# Patient Record
Sex: Female | Born: 1975
Health system: Southern US, Community
[De-identification: ages and names within clinical notes are randomized; demographics above are authoritative.]

## PROBLEM LIST (undated history)

## (undated) DIAGNOSIS — Z9889 Other specified postprocedural states: Secondary | ICD-10-CM

## (undated) DIAGNOSIS — I10 Essential (primary) hypertension: Secondary | ICD-10-CM

## (undated) DIAGNOSIS — N12 Tubulo-interstitial nephritis, not specified as acute or chronic: Secondary | ICD-10-CM

## (undated) DIAGNOSIS — F1910 Other psychoactive substance abuse, uncomplicated: Secondary | ICD-10-CM

## (undated) DIAGNOSIS — G8929 Other chronic pain: Secondary | ICD-10-CM

## (undated) DIAGNOSIS — T4145XA Adverse effect of unspecified anesthetic, initial encounter: Secondary | ICD-10-CM

## (undated) DIAGNOSIS — R112 Nausea with vomiting, unspecified: Secondary | ICD-10-CM

## (undated) DIAGNOSIS — T8859XA Other complications of anesthesia, initial encounter: Secondary | ICD-10-CM

## (undated) HISTORY — PX: FRACTURE SURGERY: SHX138

## (undated) HISTORY — PX: DILATION AND CURETTAGE OF UTERUS: SHX78

---

## 2009-07-17 ENCOUNTER — Emergency Department (HOSPITAL_COMMUNITY): Admission: EM | Admit: 2009-07-17 | Discharge: 2009-07-17 | Payer: Self-pay | Admitting: Emergency Medicine

## 2009-12-29 HISTORY — PX: PERCUTANEOUS PINNING TOE FRACTURE: SUR1018

## 2010-11-12 ENCOUNTER — Emergency Department (HOSPITAL_COMMUNITY): Admission: EM | Admit: 2010-11-12 | Discharge: 2010-11-12 | Payer: Self-pay | Admitting: Emergency Medicine

## 2011-04-29 ENCOUNTER — Inpatient Hospital Stay (HOSPITAL_COMMUNITY)
Admission: AD | Admit: 2011-04-29 | Discharge: 2011-04-29 | Disposition: A | Discharge status: Home / Self Care | Payer: Medicaid Other | Source: Ambulatory Visit | Attending: Family Medicine | Admitting: Family Medicine

## 2011-04-29 DIAGNOSIS — N751 Abscess of Bartholin's gland: Secondary | ICD-10-CM | POA: Insufficient documentation

## 2011-05-01 ENCOUNTER — Inpatient Hospital Stay (HOSPITAL_COMMUNITY)
Admission: AD | Admit: 2011-05-01 | Discharge: 2011-05-01 | Disposition: A | Discharge status: Home / Self Care | Payer: Medicaid Other | Source: Ambulatory Visit | Attending: Obstetrics & Gynecology | Admitting: Obstetrics & Gynecology

## 2011-05-01 DIAGNOSIS — N751 Abscess of Bartholin's gland: Secondary | ICD-10-CM | POA: Insufficient documentation

## 2011-05-29 ENCOUNTER — Encounter: Payer: Medicaid Other | Admitting: Obstetrics and Gynecology

## 2012-10-05 ENCOUNTER — Encounter (HOSPITAL_COMMUNITY): Payer: Self-pay | Admitting: General Practice

## 2012-10-05 ENCOUNTER — Emergency Department (INDEPENDENT_AMBULATORY_CARE_PROVIDER_SITE_OTHER)
Admission: EM | Admit: 2012-10-05 | Discharge: 2012-10-05 | Disposition: A | Discharge status: Nursing Home (Includes ICF) | Payer: Self-pay | Source: Home / Self Care | Attending: Family Medicine | Admitting: Family Medicine

## 2012-10-05 ENCOUNTER — Encounter (HOSPITAL_COMMUNITY): Payer: Self-pay | Admitting: *Deleted

## 2012-10-05 ENCOUNTER — Inpatient Hospital Stay (HOSPITAL_COMMUNITY)
Admission: EM | Admit: 2012-10-05 | Discharge: 2012-10-08 | DRG: 872 | Disposition: A | Discharge status: Home / Self Care | Payer: Medicaid Other | Attending: Internal Medicine | Admitting: Internal Medicine

## 2012-10-05 DIAGNOSIS — N289 Disorder of kidney and ureter, unspecified: Secondary | ICD-10-CM | POA: Diagnosis present

## 2012-10-05 DIAGNOSIS — R000 Tachycardia, unspecified: Secondary | ICD-10-CM | POA: Diagnosis present

## 2012-10-05 DIAGNOSIS — E876 Hypokalemia: Secondary | ICD-10-CM | POA: Diagnosis present

## 2012-10-05 DIAGNOSIS — N12 Tubulo-interstitial nephritis, not specified as acute or chronic: Secondary | ICD-10-CM | POA: Diagnosis present

## 2012-10-05 DIAGNOSIS — A419 Sepsis, unspecified organism: Secondary | ICD-10-CM | POA: Diagnosis present

## 2012-10-05 DIAGNOSIS — E878 Other disorders of electrolyte and fluid balance, not elsewhere classified: Secondary | ICD-10-CM | POA: Diagnosis present

## 2012-10-05 DIAGNOSIS — E871 Hypo-osmolality and hyponatremia: Secondary | ICD-10-CM | POA: Diagnosis present

## 2012-10-05 DIAGNOSIS — R6510 Systemic inflammatory response syndrome (SIRS) of non-infectious origin without acute organ dysfunction: Secondary | ICD-10-CM | POA: Diagnosis present

## 2012-10-05 DIAGNOSIS — N10 Acute tubulo-interstitial nephritis: Secondary | ICD-10-CM

## 2012-10-05 DIAGNOSIS — N179 Acute kidney failure, unspecified: Secondary | ICD-10-CM | POA: Diagnosis present

## 2012-10-05 DIAGNOSIS — D649 Anemia, unspecified: Secondary | ICD-10-CM | POA: Diagnosis present

## 2012-10-05 DIAGNOSIS — Z23 Encounter for immunization: Secondary | ICD-10-CM

## 2012-10-05 HISTORY — DX: Essential (primary) hypertension: I10

## 2012-10-05 LAB — CREATININE, SERUM
Creatinine, Ser: 1.3 mg/dL — ABNORMAL HIGH (ref 0.50–1.10)
GFR calc Af Amer: 60 mL/min — ABNORMAL LOW (ref >90)
GFR calc non Af Amer: 52 mL/min — ABNORMAL LOW (ref >90)

## 2012-10-05 LAB — BASIC METABOLIC PANEL
Calcium: 9.6 mg/dL (ref 8.4–10.5)
GFR calc non Af Amer: 38 mL/min — ABNORMAL LOW (ref >90)
Sodium: 131 mEq/L — ABNORMAL LOW (ref 135–145)

## 2012-10-05 LAB — CBC WITH DIFFERENTIAL/PLATELET
Basophils Absolute: 0 10*3/uL (ref 0.0–0.1)
Basophils Relative: 0 % (ref 0–1)
Eosinophils Absolute: 0 10*3/uL (ref 0.0–0.7)
Eosinophils Relative: 0 % (ref 0–5)
MCH: 32.5 pg (ref 26.0–34.0)
MCV: 92.6 fL (ref 78.0–100.0)
Platelets: 179 10*3/uL (ref 150–400)
RDW: 12.8 % (ref 11.5–15.5)
WBC: 13.6 10*3/uL — ABNORMAL HIGH (ref 4.0–10.5)

## 2012-10-05 LAB — POCT URINALYSIS DIP (DEVICE)
Glucose, UA: NEGATIVE mg/dL
Nitrite: NEGATIVE
Urobilinogen, UA: 2 mg/dL — ABNORMAL HIGH (ref 0.0–1.0)
pH: 5.5 (ref 5.0–8.0)

## 2012-10-05 LAB — CBC
Hemoglobin: 10 g/dL — ABNORMAL LOW (ref 12.0–15.0)
MCHC: 33.2 g/dL (ref 30.0–36.0)
WBC: 10.8 10*3/uL — ABNORMAL HIGH (ref 4.0–10.5)

## 2012-10-05 LAB — PROTIME-INR: Prothrombin Time: 15.3 seconds — ABNORMAL HIGH (ref 11.6–15.2)

## 2012-10-05 MED ORDER — POTASSIUM CHLORIDE CRYS ER 20 MEQ PO TBCR
40.0000 meq | EXTENDED_RELEASE_TABLET | Freq: Three times a day (TID) | ORAL | Status: DC
  Administered 2012-10-05 (×2): 40 meq via ORAL
  Filled 2012-10-05 (×3): qty 2

## 2012-10-05 MED ORDER — HEPARIN SODIUM (PORCINE) 5000 UNIT/ML IJ SOLN
5000.0000 [IU] | Freq: Three times a day (TID) | INTRAMUSCULAR | Status: DC
  Administered 2012-10-05 – 2012-10-08 (×8): 5000 [IU] via SUBCUTANEOUS
  Filled 2012-10-05 (×12): qty 1

## 2012-10-05 MED ORDER — ACETAMINOPHEN 325 MG PO TABS
650.0000 mg | ORAL_TABLET | Freq: Four times a day (QID) | ORAL | Status: DC | PRN
  Administered 2012-10-05 – 2012-10-06 (×2): 650 mg via ORAL
  Filled 2012-10-05 (×2): qty 2

## 2012-10-05 MED ORDER — ACETAMINOPHEN 325 MG PO TABS
650.0000 mg | ORAL_TABLET | Freq: Once | ORAL | Status: AC
  Administered 2012-10-05: 650 mg via ORAL

## 2012-10-05 MED ORDER — SODIUM CHLORIDE 0.9 % IV BOLUS (SEPSIS)
1000.0000 mL | Freq: Once | INTRAVENOUS | Status: AC
  Administered 2012-10-05: 1000 mL via INTRAVENOUS

## 2012-10-05 MED ORDER — ACETAMINOPHEN 650 MG RE SUPP: 650.0000 mg | Freq: Four times a day (QID) | RECTAL | Status: DC | PRN

## 2012-10-05 MED ORDER — MORPHINE SULFATE 4 MG/ML IJ SOLN
4.0000 mg | Freq: Once | INTRAMUSCULAR | Status: AC
  Administered 2012-10-05: 4 mg via INTRAVENOUS
  Filled 2012-10-05: qty 1

## 2012-10-05 MED ORDER — WHITE PETROLATUM GEL
Status: AC
  Filled 2012-10-05: qty 5

## 2012-10-05 MED ORDER — IBUPROFEN 600 MG PO TABS
600.0000 mg | ORAL_TABLET | Freq: Four times a day (QID) | ORAL | Status: DC | PRN
  Administered 2012-10-05 (×2): 600 mg via ORAL
  Filled 2012-10-05: qty 3
  Filled 2012-10-05: qty 1

## 2012-10-05 MED ORDER — DEXTROSE 5 % IV SOLN
1.0000 g | Freq: Once | INTRAVENOUS | Status: AC
Start: 2012-10-05 — End: 2012-10-05
  Administered 2012-10-05: 1 g via INTRAVENOUS
  Filled 2012-10-05 (×2): qty 10

## 2012-10-05 MED ORDER — PNEUMOCOCCAL VAC POLYVALENT 25 MCG/0.5ML IJ INJ
0.5000 mL | INJECTION | INTRAMUSCULAR | Status: AC
  Administered 2012-10-06: 0.5 mL via INTRAMUSCULAR
  Filled 2012-10-05: qty 0.5

## 2012-10-05 MED ORDER — ONDANSETRON HCL 4 MG/2ML IJ SOLN
4.0000 mg | Freq: Four times a day (QID) | INTRAMUSCULAR | Status: DC | PRN
  Administered 2012-10-05 – 2012-10-06 (×3): 4 mg via INTRAVENOUS
  Filled 2012-10-05 (×4): qty 2

## 2012-10-05 MED ORDER — ONDANSETRON HCL 4 MG/2ML IJ SOLN
4.0000 mg | Freq: Once | INTRAMUSCULAR | Status: AC
  Administered 2012-10-05: 4 mg via INTRAVENOUS
  Filled 2012-10-05: qty 2

## 2012-10-05 MED ORDER — PANTOPRAZOLE SODIUM 40 MG PO TBEC
40.0000 mg | DELAYED_RELEASE_TABLET | Freq: Every day | ORAL | Status: DC
  Administered 2012-10-06 – 2012-10-08 (×3): 40 mg via ORAL
  Filled 2012-10-05 (×3): qty 1

## 2012-10-05 MED ORDER — SODIUM CHLORIDE 0.9 % IV SOLN
INTRAVENOUS | Status: AC
  Administered 2012-10-05: 19:00:00 via INTRAVENOUS

## 2012-10-05 MED ORDER — CIPROFLOXACIN IN D5W 400 MG/200ML IV SOLN
400.0000 mg | Freq: Once | INTRAVENOUS | Status: AC
  Administered 2012-10-05: 400 mg via INTRAVENOUS
  Filled 2012-10-05: qty 200

## 2012-10-05 MED ORDER — ONDANSETRON HCL 4 MG PO TABS: 4.0000 mg | ORAL_TABLET | Freq: Four times a day (QID) | ORAL | Status: DC | PRN

## 2012-10-05 NOTE — H&P (Signed)
-  Agree with IV Rocephin, urine cultures and blood pending. Continue IV fluids and 125. We will give her a liter, Strict I.'s and O.'s. Continue Tylenol for fevers ibuprofen for pain. -Check basic metabolic panel in the morning. To followup on her creatinine. The most likely cause of her acute kidney injury is probably decreased intravascular volume, she has hyponatremia hypochloremia and hypokalemia,replete her potassium and check magnesium level.

## 2012-10-05 NOTE — ED Notes (Signed)
Pt reports back pain and frequent urination with vomiting since friday

## 2012-10-05 NOTE — H&P (Signed)
[facility] History and Physical  Marcella Bravata ZOX:[id number] DOB: [birth year] DOA: 10/05/2012  Referring physician: Patria Mane PCP: Dorrene German, MD  Specialists:   Chief Complaint:   HPI: Kaylee Simpson is a 36 y.o. female with no medical history presents to ED cc low back pain. Info obtained from patient. Reports that 4 days ago developed mild low back pain. Took OTC med and got some relief. Then two days ago pain worsened and spread to flank areas and upper abdomen. Associated symptoms include, chills, nausea, vomiting, diaphoresis, decreased po intake. Denies CP, sob, headache. Describes pain as ache, rates 7/10. Nothing makes better or worse. Symptoms came on suddenly and persisted and worsened. Work up yields dehydration, UTI, SIRS. We are asked to admit for further evaluation.    Review of Systems: The patient denies  weight loss,, vision loss, decreased hearing, hoarseness, chest pain, syncope, dyspnea on exertion, peripheral edema, balance deficits, hemoptysis,  melena, hematochezia, severe indigestion/heartburn, hematuria, incontinence, genital sores, muscle weakness, suspicious skin lesions, transient blindness, difficulty walking, depression, unusual weight change, abnormal bleeding, enlarged lymph nodes, angioedema, and breast masses.    No past medical history on file. No past surgical history on file. Social History:  reports that she smokes about 5 cigs daily. She does not have any smokeless tobacco history on file. She reports that she does not drink alcohol. Her drug history not on file.   No Known Allergies  Family History  Problem Relation Age of Onset  . HTN  Mother and father with HTN. Cousins with lupus  Prior to Admission medications   Medication Sig Start Date End Date Taking? Authorizing Provider  ibuprofen (ADVIL,MOTRIN) 200 MG tablet Take 600 mg by mouth every 6 (six) hours as needed. For pain   Yes Historical Provider, MD   Physical Exam: Filed Vitals:    10/05/12 1326  BP: 120/68  Pulse: 109  Temp: 100.6 F (38.1 C)  TempSrc: Oral  Resp: 18  SpO2: 99%     General:  Awake, alert well nourished  Eyes: PERRL EOMI no scleral icterus  ENT: mucus membranes pink but dry, ears clear, nose without drainage  Neck: supple no JVD   Cardiovascular: tachycardic but regular. No MGR No LEE PPP  Respiratory: Normal effort. BSCTAB no wheeze  Abdomen: soft, +BS mild tenderness upper quadrants  Skin: warm, slightly moist, no rash or lesions  Musculoskeletal: MOE No joint swelling erythema. Joints non-tender to palpation  Psychiatric: appropriate, cooperative  Neurologic: cranial nerve II-XII intact. Speech clear.   Labs on Admission:  Basic Metabolic Panel:  Lab 10/05/12 8119  NA 131*  K 2.9*  CL 93*  CO2 23  GLUCOSE 115*  BUN 25*  CREATININE 1.66*  CALCIUM 9.6  MG --  PHOS --   Liver Function Tests: No results found for this basename: AST:5,ALT:5,ALKPHOS:5,BILITOT:5,PROT:5,ALBUMIN:5 in the last 168 hours No results found for this basename: LIPASE:5,AMYLASE:5 in the last 168 hours No results found for this basename: AMMONIA:5 in the last 168 hours CBC:  Lab 10/05/12 1332  WBC 13.6*  NEUTROABS 9.5*  HGB 12.3  HCT 35.0*  MCV 92.6  PLT 179   Cardiac Enzymes: No results found for this basename: CKTOTAL:5,CKMB:5,CKMBINDEX:5,TROPONINI:5 in the last 168 hours  BNP (last 3 results) No results found for this basename: PROBNP:3 in the last 8760 hours CBG: No results found for this basename: GLUCAP:5 in the last 168 hours  Radiological Exams on Admission: No results found.  EKG: Independently reviewed.   Assessment/Plan Principal Problem:  *  Pyelonephritis: will continue cipro and rocephin IV. Will hydrate with IV fluids. Will check urine culture and adjust antibiotics as needed based on culture. SIRS (systemic inflammatory response syndrome): related to #1. WC elevated, tachycardic, fever. Will check urine culture.  Continue IV fluids and Iv antibiotics. Currently pt is hemodynamically stable.  Monitor closely  Acute renal insufficiency: likely related to decreased po intake as a result of #1 and #2. Will hydrate with IV fluids and check in am. Will hold any nephrotoxins.  Dehydration: due to #1. See treatment as above. Will recheck bmet in am.  Active Problems:  Hypokalemia: related to #4. Will replete and recheck  Hyponatremia: related to #4. IV fluids as above. Will recheck in am  Tachycardia: due to #1 and #2. See treatments above. Will admit to tele and monitor closely.     Code Status: full Family Communication: patient at bedside Disposition Plan: Home when medically stable hopefully 24-36 hours  Time spent: 45 minutes  Gwenyth Bender  NP [facility]   If 7PM-7AM, please contact night-coverage [email or web address] Password [email or web address] 10/05/2012, 3:22 PM

## 2012-10-05 NOTE — ED Provider Notes (Signed)
History     CSN: [id number]  Arrival date & time 10/05/12  1318   First MD Initiated Contact with Patient 10/05/12 1320      Chief Complaint  Patient presents with  . Abdominal Pain    (Consider location/radiation/quality/duration/timing/severity/associated sxs/prior treatment) HPI  From the [facility] to the ER for evaluation of Pyelonephritis. Pt is febrile, tachycardic and in pain. Her urine showed large amount of leukocytes at the Urgent Care. Pt says that she has been nauseous but has not vomiting. She denies feeling weak but has felt warm "to the touch". She denies having urinary symptoms up until two days ago. She denies ever having this happen in the past or having chronic medical issues.  No past medical history on file.  No past surgical history on file.  Family History  Problem Relation Age of Onset  . Family history unknown: Yes    History  Substance Use Topics  . Smoking status: Never Smoker   . Smokeless tobacco: Not on file  . Alcohol Use: No    OB History    Grav Para Term Preterm Abortions TAB SAB Ect Mult Living                  Review of Systems   Review of Systems  Gen: no weight loss, fevers, chills, night sweats  Eyes: no discharge or drainage, no occular pain or visual changes  Nose: no epistaxis or rhinorrhea  Mouth: no dental pain, no sore throat  Neck: no neck pain  Lungs:No wheezing, coughing or hemoptysis CV: no chest pain, palpitations, dependent edema or orthopnea  Abd: no abdominal pain, nausea, vomiting  GU: + dysuria, + CVA tenderness, + low back pain and right flank pain,  or no gross hematuria  MSK:  No abnormalities  Neuro: no headache, no focal neurologic deficits  Skin: no abnormalities Psyche: negative.    Allergies  Review of patient's allergies indicates no known allergies.  Home Medications   Current Outpatient Rx  Name Route Sig Dispense Refill  . IBUPROFEN 200 MG PO TABS Oral Take 600 mg by mouth every 6 (six)  hours as needed. For pain      BP 120/68  Pulse 109  Temp 100.6 F (38.1 C) (Oral)  Resp 18  SpO2 99%  LMP 09/26/2012  Physical Exam  Physical Exam  Nursing note and vitals reviewed.  Constitutional: She is oriented to person, place, and time. She appears well-developed and well-nourished. She appears distressed.  Neck: Normal range of motion. Neck supple.  Cardiovascular: Normal rate, regular rhythm, normal heart sounds and intact distal pulses.  Pulmonary/Chest: Effort normal and breath sounds normal.  Abdominal: Soft. She exhibits no distension and no mass. Bowel sounds are decreased. There is no hepatosplenomegaly. There is tenderness in the right upper quadrant, epigastric area and left upper quadrant. There is CVA tenderness. There is no rebound and no guarding.  Lymphadenopathy:  She has no cervical adenopathy.  Neurological: She is alert and oriented to person, place, and time.  Skin: Skin is warm and dry.    ED Course  Procedures (including critical care time)  Labs Reviewed  CBC WITH DIFFERENTIAL - Abnormal; Notable for the following:    WBC 13.6 (*)     RBC 3.78 (*)     HCT 35.0 (*)     Neutro Abs 9.5 (*)     Lymphocytes Relative 8 (*)     Monocytes Relative 23 (*)     Monocytes  Absolute 3.1 (*)     All other components within normal limits  BASIC METABOLIC PANEL - Abnormal; Notable for the following:    Sodium 131 (*)     Potassium 2.9 (*)     Chloride 93 (*)     Glucose, Bld 115 (*)     BUN 25 (*)     Creatinine, Ser 1.66 (*)     GFR calc non Af Amer 38 (*)     GFR calc Af Amer 45 (*)     All other components within normal limits  URINE CULTURE   No results found. Results for orders placed during the hospital encounter of 10/05/12  CBC WITH DIFFERENTIAL      Component Value Range   WBC 13.6 (*) 4.0 - 10.5 K/uL   RBC 3.78 (*) 3.87 - 5.11 MIL/uL   Hemoglobin 12.3  12.0 - 15.0 g/dL   HCT 16.1 (*) 09.6 - 04.5 %   MCV 92.6  78.0 - 100.0 fL   MCH  32.5  26.0 - 34.0 pg   MCHC 35.1  30.0 - 36.0 g/dL   RDW 40.9  81.1 - 91.4 %   Platelets 179  150 - 400 K/uL   Neutrophils Relative 69  43 - 77 %   Neutro Abs 9.5 (*) 1.7 - 7.7 K/uL   Lymphocytes Relative 8 (*) 12 - 46 %   Lymphs Abs 1.0  0.7 - 4.0 K/uL   Monocytes Relative 23 (*) 3 - 12 %   Monocytes Absolute 3.1 (*) 0.1 - 1.0 K/uL   Eosinophils Relative 0  0 - 5 %   Eosinophils Absolute 0.0  0.0 - 0.7 K/uL   Basophils Relative 0  0 - 1 %   Basophils Absolute 0.0  0.0 - 0.1 K/uL  BASIC METABOLIC PANEL      Component Value Range   Sodium 131 (*) 135 - 145 mEq/L   Potassium 2.9 (*) 3.5 - 5.1 mEq/L   Chloride 93 (*) 96 - 112 mEq/L   CO2 23  19 - 32 mEq/L   Glucose, Bld 115 (*) 70 - 99 mg/dL   BUN 25 (*) 6 - 23 mg/dL   Creatinine, Ser 7.82 (*) 0.50 - 1.10 mg/dL   Calcium 9.6  8.4 - 95.6 mg/dL   GFR calc non Af Amer 38 (*) >90 mL/min   GFR calc Af Amer 45 (*) >90 mL/min   No results found.    1. Pyelonephritis       MDM  Pt has pyelonephritis with fever, tachycardia and new onset renal insufficiency. I believe she meets admission criteria.  I have had her admitted to [facility], inpatient, med-surg        Dorthula Matas, [place] 10/05/12 1456

## 2012-10-05 NOTE — ED Provider Notes (Signed)
History     CSN: [id number]  Arrival date & time 10/05/12  1215   First MD Initiated Contact with Patient 10/05/12 1242      Chief Complaint  Patient presents with  . Back Pain    (Consider location/radiation/quality/duration/timing/severity/associated sxs/prior treatment) Patient is a 36 y.o. female presenting with back pain. The history is provided by the patient.  Back Pain  This is a new problem. The current episode started more than 2 days ago. The problem has been gradually worsening. Associated symptoms include a fever, abdominal pain and dysuria.    History reviewed. No pertinent past medical history.  History reviewed. No pertinent past surgical history.  Family History  Problem Relation Age of Onset  . Family history unknown: Yes    History  Substance Use Topics  . Smoking status: Never Smoker   . Smokeless tobacco: Not on file  . Alcohol Use: No    OB History    Grav Para Term Preterm Abortions TAB SAB Ect Mult Living                  Review of Systems  Constitutional: Positive for fever, chills and appetite change.  Gastrointestinal: Positive for nausea, vomiting and abdominal pain.  Genitourinary: Positive for dysuria, urgency, frequency and flank pain. Negative for vaginal bleeding, vaginal discharge and vaginal pain.  Musculoskeletal: Positive for back pain.    Allergies  Review of patient's allergies indicates no known allergies.  Home Medications  No current outpatient prescriptions on file.  BP 108/61  Pulse 118  Temp 100.7 F (38.2 C) (Oral)  Resp 20  SpO2 100%  LMP 09/26/2012  Physical Exam  Nursing note and vitals reviewed. Constitutional: She is oriented to person, place, and time. She appears well-developed and well-nourished. She appears distressed.  Neck: Normal range of motion. Neck supple.  Cardiovascular: Normal rate, regular rhythm, normal heart sounds and intact distal pulses.   Pulmonary/Chest: Effort normal and breath  sounds normal.  Abdominal: Soft. She exhibits no distension and no mass. Bowel sounds are decreased. There is no hepatosplenomegaly. There is tenderness in the right upper quadrant, epigastric area and left upper quadrant. There is CVA tenderness. There is no rebound and no guarding.  Lymphadenopathy:    She has no cervical adenopathy.  Neurological: She is alert and oriented to person, place, and time.  Skin: Skin is warm and dry.    ED Course  Procedures (including critical care time)  Labs Reviewed  POCT URINALYSIS DIP (DEVICE) - Abnormal; Notable for the following:    Bilirubin Urine SMALL (*)     Hgb urine dipstick MODERATE (*)     Protein, ur >=300 (*)     Urobilinogen, UA 2.0 (*)     Leukocytes, UA LARGE (*)  Biochemical Testing Only. Please order routine urinalysis from main lab if confirmatory testing is needed.   All other components within normal limits  POCT PREGNANCY, URINE   No results found.   1. Pyelonephritis, acute       MDM  U/a abnl.        Linna Hoff, MD 10/05/12 1310

## 2012-10-05 NOTE — ED Provider Notes (Signed)
Medical screening examination/treatment/procedure(s) were conducted as a shared visit with non-physician practitioner(s) and myself.  I personally evaluated the patient during the encounter  The patient is feeling somewhat better at this time.  She will benefit from observational stay in the hospital for her pyelonephritis.  IV antibiotics started.  Urine culture pending.  Lyanne Co, MD 10/05/12 [phone]

## 2012-10-05 NOTE — ED Notes (Signed)
Pt from [facility] co rt sided abdominal pain and back pain since sat.

## 2012-10-06 ENCOUNTER — Inpatient Hospital Stay (HOSPITAL_COMMUNITY): Payer: Medicaid Other

## 2012-10-06 DIAGNOSIS — E876 Hypokalemia: Secondary | ICD-10-CM

## 2012-10-06 LAB — COMPREHENSIVE METABOLIC PANEL
ALT: 49 U/L — ABNORMAL HIGH (ref 0–35)
AST: 48 U/L — ABNORMAL HIGH (ref 0–37)
CO2: 22 mEq/L (ref 19–32)
Chloride: 103 mEq/L (ref 96–112)
Creatinine, Ser: 1.38 mg/dL — ABNORMAL HIGH (ref 0.50–1.10)
GFR calc non Af Amer: 48 mL/min — ABNORMAL LOW (ref >90)
Sodium: 137 mEq/L (ref 135–145)
Total Bilirubin: 0.3 mg/dL (ref 0.3–1.2)

## 2012-10-06 LAB — CBC
MCH: 31.6 pg (ref 26.0–34.0)
MCHC: 33.5 g/dL (ref 30.0–36.0)
Platelets: 191 10*3/uL (ref 150–400)

## 2012-10-06 LAB — URINE CULTURE

## 2012-10-06 LAB — BASIC METABOLIC PANEL
Calcium: 8.8 mg/dL (ref 8.4–10.5)
GFR calc non Af Amer: 56 mL/min — ABNORMAL LOW (ref >90)
Sodium: 135 mEq/L (ref 135–145)

## 2012-10-06 LAB — PROTIME-INR: INR: 1.16 (ref 0.00–1.49)

## 2012-10-06 MED ORDER — POTASSIUM CHLORIDE CRYS ER 20 MEQ PO TBCR
40.0000 meq | EXTENDED_RELEASE_TABLET | Freq: Once | ORAL | Status: AC
  Administered 2012-10-06: 40 meq via ORAL
  Filled 2012-10-06: qty 2

## 2012-10-06 MED ORDER — CEFTRIAXONE SODIUM 1 G IJ SOLR
1.0000 g | INTRAMUSCULAR | Status: DC
  Administered 2012-10-06 – 2012-10-07 (×2): 1 g via INTRAVENOUS
  Filled 2012-10-06 (×3): qty 10

## 2012-10-06 MED ORDER — OXYCODONE-ACETAMINOPHEN 5-325 MG PO TABS
1.0000 | ORAL_TABLET | ORAL | Status: DC | PRN
  Administered 2012-10-06 – 2012-10-08 (×6): 1 via ORAL
  Filled 2012-10-06 (×6): qty 1

## 2012-10-06 MED ORDER — DIPHENHYDRAMINE HCL 25 MG PO CAPS
50.0000 mg | ORAL_CAPSULE | ORAL | Status: DC | PRN
  Administered 2012-10-06 – 2012-10-08 (×4): 50 mg via ORAL
  Filled 2012-10-06 (×4): qty 2

## 2012-10-06 MED ORDER — SODIUM CHLORIDE 0.9 % IV SOLN
INTRAVENOUS | Status: AC
  Administered 2012-10-06: 15:00:00 via INTRAVENOUS

## 2012-10-06 NOTE — Progress Notes (Signed)
Patient ID: Kaylee Simpson, female   DOB: September 04, 1976, 36 y.o.   MRN: [id number] [facility] PROGRESS NOTE  Kaylee Simpson WUJ:[id number] DOB: 06-Feb-1976 DOA: 10/05/2012 PCP: Dorrene German, MD  Assessment/Plan: Principal Problem:  *Pyelonephritis Active Problems:  Hypokalemia  Hyponatremia  Tachycardia  SIRS (systemic inflammatory response syndrome)  Acute renal insufficiency  HPI: Kaylee Simpson is a 36 y.o. female with no medical history presents to ED cc low back pain. Info obtained from patient. Reports that 4 days ago developed mild low back pain. Took OTC med and got some relief. Then two days ago pain worsened and spread to flank areas and upper abdomen. Associated symptoms include, chills, nausea, vomiting, diaphoresis, decreased po intake. Denies CP, sob, headache. Describes pain as ache, rates 7/10. Nothing makes better or worse. Symptoms came on suddenly and persisted and worsened. Work up yields dehydration, UTI, SIRS  Pyelonephritis.  Fever 100.7 overnight.  Back pain still present but improved.  On IV Rocephin.  Cultures pending.  Renal U/S pending.  Acute renal failure.  Secondary to pyelonephritis and dehydration.  Hypokalemia.  Initially K was 2.9.  Being repleted.  Magnesium is 2.0  Normocytic anemia.  Stable for outpatient work up.   Code Status: full code Family Communication:  Disposition Plan:  To home when ready.    Antibiotics:  Rocephin  HPI/Subjective: Feels better today.  Back pain present but improved.  Has burning at the end of her urine stream.  Objective: Filed Vitals:   10/06/12 0427 10/06/12 0429 10/06/12 0432 10/06/12 0924  BP: 90/53 89/53 91/52    Pulse: 80 81 82 72  Temp: 97.5 F (36.4 C) 97.4 F (36.3 C) 97.5 F (36.4 C) 98.4 F (36.9 C)  TempSrc: Oral Oral Oral Oral  Resp: 20 20 20    Height:      Weight:      SpO2: 100% 100% 100% 98%    Intake/Output Summary (Last 24 hours) at 10/06/12 1143 Last data filed at 10/06/12  0408  Gross per 24 hour  Intake 1204.5 ml  Output    150 ml  Net 1054.5 ml    Exam:   General:  A&O, NAD, non-toxic  Cardiovascular: RRR, No M/R/G, no Lower Extremity Edema  Respiratory: CTA, No increased work of breathing  Abdomen: Soft, NT, ND, positive bowel sounds, no obvious masses.  + CVA tenderness  Neurological:  Cranial Nerves 2 - 12 are grossly intact, exam non focal  Skin:  No rashes, bruises, or lesions  Data Reviewed: Basic Metabolic Panel:  Lab 10/06/12 9562 10/06/12 0540 10/05/12 1735 10/05/12 1332  NA 135 137 -- 131*  K 3.4* 3.1* -- 2.9*  CL 103 103 -- 93*  CO2 20 22 -- 23  GLUCOSE 108* 114* -- 115*  BUN 22 24* -- 25*  CREATININE 1.22* 1.38* 1.30* 1.66*  CALCIUM 8.8 8.6 -- 9.6  MG -- -- 2.0 --  PHOS -- -- -- --   Liver Function Tests:  Lab 10/06/12 0540  AST 48*  ALT 49*  ALKPHOS 75  BILITOT 0.3  PROT 7.0  ALBUMIN 2.5*   CBC:  Lab 10/06/12 0850 10/05/12 1735 10/05/12 1332  WBC 11.3* 10.8* 13.6*  NEUTROABS -- -- 9.5*  HGB 10.9* 10.0* 12.3  HCT 32.5* 30.1* 35.0*  MCV 94.2 93.5 92.6  PLT 191 158 179     Recent Results (from the past 240 hour(s))  CULTURE, BLOOD (ROUTINE X 2)     Status: Normal (Preliminary result)   Collection Time  10/05/12  3:30 PM      Component Value Range Status Comment   Specimen Description BLOOD ARM RIGHT   Final    Special Requests BOTTLES DRAWN AEROBIC AND ANAEROBIC 10CC   Final    Culture  Setup Time 10/05/2012 21:12   Final    Culture     Final    Value:        BLOOD CULTURE RECEIVED NO GROWTH TO DATE CULTURE WILL BE HELD FOR 5 DAYS BEFORE ISSUING A FINAL NEGATIVE REPORT   Report Status PENDING   Incomplete   CULTURE, BLOOD (ROUTINE X 2)     Status: Normal (Preliminary result)   Collection Time   10/05/12  3:50 PM      Component Value Range Status Comment   Specimen Description BLOOD HAND RIGHT   Final    Special Requests BOTTLES DRAWN AEROBIC AND ANAEROBIC 10CC   Final    Culture  Setup Time  10/05/2012 21:12   Final    Culture     Final    Value:        BLOOD CULTURE RECEIVED NO GROWTH TO DATE CULTURE WILL BE HELD FOR 5 DAYS BEFORE ISSUING A FINAL NEGATIVE REPORT   Report Status PENDING   Incomplete      Studies: No results found.  Scheduled Meds:   . sodium chloride   Intravenous STAT  . acetaminophen  650 mg Oral Once  . cefTRIAXone (ROCEPHIN)  IV  1 g Intravenous Once  . cefTRIAXone (ROCEPHIN)  IV  1 g Intravenous Q24H  . ciprofloxacin  400 mg Intravenous Once  . heparin  5,000 Units Subcutaneous Q8H  .  morphine injection  4 mg Intravenous Once  . ondansetron  4 mg Intravenous Once  . pantoprazole  40 mg Oral Q1200  . pneumococcal 23 valent vaccine  0.5 mL Intramuscular Tomorrow-1000  . potassium chloride  40 mEq Oral TID  . potassium chloride  40 mEq Oral Once  . sodium chloride  1,000 mL Intravenous Once  . sodium chloride  1,000 mL Intravenous Once  . white petrolatum       Continuous Infusions:    Stephani Police [facility] Pager [phone]  If 7PM-7AM, please contact night-coverage [email or web address] Password [email or web address] 10/06/2012, 11:43 AM   LOS: 1 day   Attending - Seen and examined, agree with documentation as noted above. Kaylee Simpson feels better, still has back pain (is in her b/l flanks) but is much less intense. Urine cultures are still pending, blood cultures are negative. On exam she has B/L CVA tenderness, and as well upper abd (B/L) tenderness. Likely she has b/l pyelonephritis-will continue with Rocephin, await Renal Ultrasound. If still febrile for the next day or so-then will get a CT Abdomen. In interim-c/w current care.  Windell Norfolk MD

## 2012-10-07 ENCOUNTER — Inpatient Hospital Stay (HOSPITAL_COMMUNITY): Payer: Medicaid Other

## 2012-10-07 LAB — CBC
Platelets: 250 10*3/uL (ref 150–400)
RBC: 3.19 MIL/uL — ABNORMAL LOW (ref 3.87–5.11)
WBC: 8 10*3/uL (ref 4.0–10.5)

## 2012-10-07 LAB — BASIC METABOLIC PANEL
CO2: 21 mEq/L (ref 19–32)
Calcium: 9 mg/dL (ref 8.4–10.5)
Chloride: 105 mEq/L (ref 96–112)
Sodium: 139 mEq/L (ref 135–145)

## 2012-10-07 LAB — VITAMIN B12: Vitamin B-12: 1153 pg/mL — ABNORMAL HIGH (ref 211–911)

## 2012-10-07 LAB — IRON AND TIBC
Iron: 24 ug/dL — ABNORMAL LOW (ref 42–135)
Saturation Ratios: 14 % — ABNORMAL LOW (ref 20–55)
TIBC: 177 ug/dL — ABNORMAL LOW (ref 250–470)

## 2012-10-07 LAB — FOLATE: Folate: 12 ng/mL

## 2012-10-07 LAB — SEDIMENTATION RATE: Sed Rate: >140 mm/hr — ABNORMAL HIGH (ref 0–22)

## 2012-10-07 MED ORDER — POTASSIUM CHLORIDE CRYS ER 20 MEQ PO TBCR
40.0000 meq | EXTENDED_RELEASE_TABLET | Freq: Two times a day (BID) | ORAL | Status: AC
  Administered 2012-10-07 (×2): 40 meq via ORAL
  Filled 2012-10-07 (×2): qty 2

## 2012-10-07 NOTE — Progress Notes (Signed)
Patient ID: Kaylee Simpson, female   DOB: [birth year], 36 y.o.   MRN: [id number]  [facility] PROGRESS NOTE  Jaslynne Dahan WUJ:[id number] DOB: Sep 05, 1976 DOA: 10/05/2012 PCP: Dorrene German, MD  Assessment/Plan: Principal Problem:  *Pyelonephritis Active Problems:  Hypokalemia  Hyponatremia  Tachycardia  SIRS (systemic inflammatory response syndrome)  Acute renal insufficiency   HPI: Mialee Weyman is a 36 y.o. female with no medical history presents to ED cc low back pain. Info obtained from patient. Reports that 4 days ago developed mild low back pain. Took OTC med and got some relief. Then two days ago pain worsened and spread to flank areas and upper abdomen. Associated symptoms include, chills, nausea, vomiting, diaphoresis, decreased po intake. Denies CP, sob, headache. Describes pain as ache, rates 7/10. Nothing makes better or worse. Symptoms came on suddenly and persisted and worsened. Work up yields dehydration, UTI, SIRS  Pyelonephritis.  No fever overnight, but patient is having shaking chills and back pain after her percocet wears off. On IV Rocephin.  Cultures negative, but we still feel this likely infectious (patient had antibiotics prior to admission).  Renal U/S shows:  "Increased echogenicity in the kidneys bilaterally, suggesting medical renal disease".  Patient has no history of excessive NSAIDs and is not on nephrotoxic meds.  She reports she has a strong fam hx of lupus.  Will check lupus panel and complete abdominal xray.  Acute renal failure.  Secondary to pyelonephritis and dehydration.  BUN and creatinine have normalized.  Hypokalemia.  Initially K was 2.9.  Being repleted.  Magnesium is 2.0  Normocytic anemia.  Patient with mild hematuria. Reports periods are usually 3 days and not heavy.  Will check anemia panel.   Code Status: full code Family Communication:  Disposition Plan:  To home when ready.    Antibiotics:  Rocephin  HPI/Subjective: Feels  better today.  Back pain present but improved.  Has burning at the end of her urine stream.  Objective: Filed Vitals:   10/06/12 1400 10/06/12 1417 10/06/12 2223 10/07/12 0457  BP: 108/59 110/58 130/82 110/67  Pulse: 100 98 94 70  Temp: 99.8 F (37.7 C) 99.9 F (37.7 C) 99.1 F (37.3 C) 98.8 F (37.1 C)  TempSrc: Oral Oral Oral Oral  Resp: 18 12 18 16   Height:      Weight:      SpO2: 100% 99% 100% 100%    Intake/Output Summary (Last 24 hours) at 10/07/12 1120 Last data filed at 10/07/12 0030  Gross per 24 hour  Intake    955 ml  Output      0 ml  Net    955 ml    Exam:   General:  A&O, NAD, non-toxic  Cardiovascular: RRR, No M/R/G, no Lower Extremity Edema  Respiratory: CTA, No increased work of breathing  Abdomen: Soft, NT, ND, positive bowel sounds, no obvious masses.  + CVA tenderness  Neurological:  Cranial Nerves 2 - 12 are grossly intact, exam non focal  Skin:  No rashes, bruises, or lesions  Data Reviewed: Basic Metabolic Panel:  Lab 10/07/12 9562 10/06/12 0850 10/06/12 0540 10/05/12 1735 10/05/12 1332  NA 139 135 137 -- 131*  K 3.4* 3.4* 3.1* -- 2.9*  CL 105 103 103 -- 93*  CO2 21 20 22  -- 23  GLUCOSE 138* 108* 114* -- 115*  BUN 9 22 24* -- 25*  CREATININE 0.87 1.22* 1.38* 1.30* 1.66*  CALCIUM 9.0 8.8 8.6 -- 9.6  MG -- -- -- 2.0 --  PHOS -- -- -- -- --   Liver Function Tests:  Lab 10/06/12 0540  AST 48*  ALT 49*  ALKPHOS 75  BILITOT 0.3  PROT 7.0  ALBUMIN 2.5*   CBC:  Lab 10/07/12 0901 10/06/12 0850 10/05/12 1735 10/05/12 1332  WBC 8.0 11.3* 10.8* 13.6*  NEUTROABS -- -- -- 9.5*  HGB 9.9* 10.9* 10.0* 12.3  HCT 30.5* 32.5* 30.1* 35.0*  MCV 95.6 94.2 93.5 92.6  PLT 250 191 158 179     Recent Results (from the past 240 hour(s))  CULTURE, BLOOD (ROUTINE X 2)     Status: Normal (Preliminary result)   Collection Time   10/05/12  3:30 PM      Component Value Range Status Comment   Specimen Description BLOOD ARM RIGHT   Final     Special Requests BOTTLES DRAWN AEROBIC AND ANAEROBIC 10CC   Final    Culture  Setup Time 10/05/2012 21:12   Final    Culture     Final    Value:        BLOOD CULTURE RECEIVED NO GROWTH TO DATE CULTURE WILL BE HELD FOR 5 DAYS BEFORE ISSUING A FINAL NEGATIVE REPORT   Report Status PENDING   Incomplete   CULTURE, BLOOD (ROUTINE X 2)     Status: Normal (Preliminary result)   Collection Time   10/05/12  3:50 PM      Component Value Range Status Comment   Specimen Description BLOOD HAND RIGHT   Final    Special Requests BOTTLES DRAWN AEROBIC AND ANAEROBIC 10CC   Final    Culture  Setup Time 10/05/2012 21:12   Final    Culture     Final    Value:        BLOOD CULTURE RECEIVED NO GROWTH TO DATE CULTURE WILL BE HELD FOR 5 DAYS BEFORE ISSUING A FINAL NEGATIVE REPORT   Report Status PENDING   Incomplete   URINE CULTURE     Status: Normal   Collection Time   10/05/12  9:30 PM      Component Value Range Status Comment   Specimen Description URINE, RANDOM   Final    Special Requests NONE   Final    Culture  Setup Time 10/06/2012 01:49   Final    Colony Count NO GROWTH   Final    Culture NO GROWTH   Final    Report Status 10/06/2012 FINAL   Final      Studies: No results found.  Scheduled Meds:    . cefTRIAXone (ROCEPHIN)  IV  1 g Intravenous Q24H  . heparin  5,000 Units Subcutaneous Q8H  . pantoprazole  40 mg Oral Q1200  . pneumococcal 23 valent vaccine  0.5 mL Intramuscular Tomorrow-1000  . potassium chloride  40 mEq Oral Once  . potassium chloride  40 mEq Oral BID  . DISCONTD: potassium chloride  40 mEq Oral TID   Continuous Infusions:    . sodium chloride Stopped (10/07/12 0137)     Stephani Police [facility] Pager [phone]  If 7PM-7AM, please contact night-coverage [email or web address] Password [email or web address] 10/07/2012, 11:20 AM   LOS: 2 days   Attending - I have seen and examined the patient, she is now mostly afebrile, leukocytosis has resolved. Still with B/L CVA  tenderness, USG Kidney's-no hydronephrosis, unfortunately urine cultures are negative-patient claims she took 2 doses of antibiotics prior to coming to the ED, claims that they gave her IV antibiotics, prior to collecting urine cultures. Because  of B/L Flank pain, CVA tenderness, fever and leukocytosis-this is still Pyelonephritis (even thought urine c/s neg). Check Abd Ultrasound-to make sure no other pathology.Continue with IV Rocephin  S Berlie Persky

## 2012-10-07 NOTE — Progress Notes (Signed)
Pt had small amount of blood in urine 10/10 am.

## 2012-10-08 DIAGNOSIS — D649 Anemia, unspecified: Secondary | ICD-10-CM | POA: Diagnosis present

## 2012-10-08 LAB — BASIC METABOLIC PANEL
CO2: 21 mEq/L (ref 19–32)
Calcium: 9.1 mg/dL (ref 8.4–10.5)
Chloride: 107 mEq/L (ref 96–112)
Creatinine, Ser: 0.76 mg/dL (ref 0.50–1.10)
Glucose, Bld: 124 mg/dL — ABNORMAL HIGH (ref 70–99)
Sodium: 140 mEq/L (ref 135–145)

## 2012-10-08 LAB — CBC
Hemoglobin: 10 g/dL — ABNORMAL LOW (ref 12.0–15.0)
MCH: 30.7 pg (ref 26.0–34.0)
MCV: 95.1 fL (ref 78.0–100.0)
RBC: 3.26 MIL/uL — ABNORMAL LOW (ref 3.87–5.11)

## 2012-10-08 MED ORDER — POLYSACCHARIDE IRON COMPLEX 150 MG PO CAPS
150.0000 mg | ORAL_CAPSULE | Freq: Every day | ORAL | Status: DC
  Administered 2012-10-08: 150 mg via ORAL
  Filled 2012-10-08: qty 1

## 2012-10-08 MED ORDER — CIPROFLOXACIN HCL 750 MG PO TABS: 750.0000 mg | ORAL_TABLET | Freq: Two times a day (BID) | ORAL | Status: DC

## 2012-10-08 MED ORDER — OXYCODONE-ACETAMINOPHEN 5-325 MG PO TABS: 1.0000 | ORAL_TABLET | ORAL | Status: DC | PRN

## 2012-10-08 MED ORDER — POLYSACCHARIDE IRON COMPLEX 150 MG PO CAPS: 150.0000 mg | ORAL_CAPSULE | Freq: Every day | ORAL | Status: DC

## 2012-10-08 NOTE — Progress Notes (Signed)
Kaylee Simpson to be D/C'd Home per MD order.  Discussed with the patient education on pyelonephritis, provided handout and all questions fully answered.   [name], [name]  Home Medication Instructions [id number]   Printed on:10/08/12 1228  Medication Information                    iron polysaccharides (NIFEREX) 150 MG capsule Take 1 capsule (150 mg total) by mouth daily.           oxyCODONE-acetaminophen (PERCOCET/ROXICET) 5-325 MG per tablet Take 1 tablet by mouth every 4 (four) hours as needed for pain.           ciprofloxacin (CIPRO) 750 MG tablet Take 1 tablet (750 mg total) by mouth 2 (two) times daily.             VVS, Skin clean, dry and intact without evidence of skin break down, no evidence of skin tears noted. IV catheter discontinued intact. Site without signs and symptoms of complications. Dressing and pressure applied.  An After Visit Summary was printed and given to the patient. Follow up appointments , new prescriptions and medication administration times given. MD note given to pt. Patient escorted via WC, and D/C home via private auto.  Cindra Eves, RN 10/08/2012 12:28 PM

## 2012-10-08 NOTE — Discharge Summary (Signed)
Physician Discharge Summary  Cyrine Glandon ZOX:[id number] DOB: 1976/06/20 DOA: 10/05/2012  PCP: Dorrene German, MD  Admit date: 10/05/2012 Discharge date: 10/08/2012  Recommendations for Outpatient Follow-up:  Lupus Panel with [facility] still Pending.  Monitor anemia.  Follow to ensure resolution of her pyelonephritis  Discharge Diagnoses:  Principal Problem:  *Pyelonephritis Active Problems:  SIRS (systemic inflammatory response syndrome)  Hypokalemia  Hyponatremia  Tachycardia  Acute renal insufficiency  Anemia  Discharge Condition: improved and stable  Diet recommendation: regular   History of present illness:   Kaylee Simpson is a 36 y.o. female with no medical history presents to ED cc low back pain. Info obtained from patient. Reports that 4 days ago developed mild low back pain. Took OTC med and got some relief. Then two days ago pain worsened and spread to flank areas and upper abdomen. Associated symptoms include, chills, nausea, vomiting, diaphoresis, decreased po intake. Denies CP, sob, headache. Describes pain as ache, rates 7/10. Nothing makes better or worse. Symptoms came on suddenly and persisted and worsened. Work up yields dehydration, UTI, SIRS.  Hospital Course:   Pyelonephritis/SIRS.  Initially the patient has fever, shaking chills and continuous back pain.  This gradually improved with antibiotic and symptomatic treatment.  However the patient still has some CVA tenderness.  She was on  IV Rocephin for three days and will be discharged with PO cipro for 4 more days. Cultures are negative, but we still feel this likely infectious (patient had antibiotics prior to admission). Renal U/S shows: "Increased echogenicity in the kidneys bilaterally, suggesting medical renal disease". Patient has no history of excessive NSAIDs and is not on nephrotoxic meds. She reports she has a strong fam hx of lupus. A lupus panel was drawn and is still pending at the time of discharge.  We request  that her primary care physician follow these results.  Acute renal failure. Secondary to pyelonephritis and dehydration. BUN and creatinine normalized with IV hydration.   Hypokalemia. Initially K was 2.9. Magnesium was 2.0.  At d/c serum K was 3.7.  Normocytic anemia. Patient with mild hematuria. Reports periods are usually 3 days and not heavy. An anemia panel was checked.  Results are below.  The patient has been started on Nu-Iron supplements.  Discharge Exam: Filed Vitals:   10/08/12 0610  BP: 124/84  Pulse: 69  Temp: 97.5 F (36.4 C)  Resp: 18   Filed Vitals:   10/07/12 0457 10/07/12 1400 10/07/12 2203 10/08/12 0610  BP: 110/67 121/85 105/66 124/84  Pulse: 70 80 73 69  Temp: 98.8 F (37.1 C) 97.9 F (36.6 C) 97.4 F (36.3 C) 97.5 F (36.4 C)  TempSrc: Oral Oral Oral Oral  Resp: 16 18 20 18   Height:      Weight:      SpO2: 100% 100% 99% 100%   General: A&O, NAD Cardiovascular: RRR, No M/R/G Respiratory: CTA, No W/C/R Abdomen:  Soft nontender, +BS, no masses, positive CVA tenderness bilaterally (but not nearly as strong as on admission).  Discharge Instructions      Discharge Orders    Future Orders Please Complete By Expires   Diet - low sodium heart healthy      Increase activity slowly          Medication List     As of 10/08/2012  2:34 PM    STOP taking these medications         ibuprofen 200 MG tablet   Commonly known as: ADVIL,MOTRIN  TAKE these medications         ciprofloxacin 750 MG tablet   Commonly known as: CIPRO   Take 1 tablet (750 mg total) by mouth 2 (two) times daily.      iron polysaccharides 150 MG capsule   Commonly known as: NIFEREX   Take 1 capsule (150 mg total) by mouth daily.      oxyCODONE-acetaminophen 5-325 MG per tablet   Commonly known as: PERCOCET/ROXICET   Take 1 tablet by mouth every 4 (four) hours as needed for pain.         Follow-up Information    Follow up with AVBUERE,EDWIN A, MD. Schedule an  appointment as soon as possible for a visit in 1 week.   Contact information:   [address] [place] [address] [phone]           The results of significant diagnostics from this hospitalization (including imaging, microbiology, ancillary and laboratory) are listed below for reference.    Significant Diagnostic Studies: US Abdomen Complete  10/07/2012  *RADIOLOGY REPORT*  Clinical Data:  Back pain.  COMPLETE ABDOMINAL ULTRASOUND  Comparison:  Renal ultrasound 10/06/2012.  Findings:  Gallbladder:  No shadowing gallstones or echogenic sludge.  No gallbladder wall thickening or pericholecystic fluid.  Negative sonographic Murphy's sign according to the ultrasound technologist.  Common bile duct:  Normal caliber measuring 4.1 mm in the porta hepatis.  Liver:  Normal size and echotexture without focal parenchymal abnormality.  Patent portal vein with hepatopetal flow.  IVC:  Patent throughout its visualized course in the abdomen.  Pancreas:  Although the pancreas is difficult to visualize in its entirety, no focal pancreatic abnormality is identified.  Spleen:  Normal size and echotexture without focal parenchymal abnormality.6.7 cm in length.  Right Kidney:  Renal parenchyma is diffusely echogenic without focal cystic or solid renal lesions.  No hydronephrosis.  No intrarenal calculi identified.  Left Kidney:  Renal parenchyma is diffusely echogenic.  There is a large well-defined anechoic lesion with associated increased through transmission and no internal blood flow in the lower pole of the left kidney measuring 4.0 x 3.5 x 3.9 cm, compatible with a simple cyst.  No other focal cystic or solid lesions are identified.  No hydronephrosis.  Abdominal aorta:  Measures up to 2.3 cm in diameter proximally, and tapers appropriately distally.  IMPRESSION: Negative abdominal ultrasound.   Original Report Authenticated By: Florencia Reasons, M.D.    US Renal  10/06/2012  *RADIOLOGY REPORT*   Clinical Data: Pyelonephritis.  Acute renal insufficiency.  RENAL/URINARY TRACT ULTRASOUND COMPLETE  Comparison:  No priors.  Findings:  Right Kidney:  Renal parenchyma is mildly diffusely echogenic without focal cystic or solid renal lesions.  No hydronephrosis. No intrarenal calculi identified.  Left Kidney:  Renal parenchyma is diffusely echogenic.  There is a large well-defined anechoic lesion with associated increased through transmission in the lower pole of the left kidney measuring 4.3 x 3.9 x 3.6 cm, compatible with a simple cyst.  No other focal cystic or solid lesions are identified.  No hydronephrosis.  Bladder:  Urinary bladder is unremarkable in appearance.  IMPRESSION: 1.  Increased echogenicity in the kidneys bilaterally, suggesting medical renal disease. 2.  Large simple cyst in the lower pole of the left kidney.   Original Report Authenticated By: Florencia Reasons, M.D.     Microbiology: Recent Results (from the past 240 hour(s))  CULTURE, BLOOD (ROUTINE X 2)     Status: Normal (Preliminary result)  Collection Time   10/05/12  3:30 PM      Component Value Range Status Comment   Specimen Description BLOOD ARM RIGHT   Final    Special Requests BOTTLES DRAWN AEROBIC AND ANAEROBIC 10CC   Final    Culture  Setup Time 10/05/2012 21:12   Final    Culture     Final    Value:        BLOOD CULTURE RECEIVED NO GROWTH TO DATE CULTURE WILL BE HELD FOR 5 DAYS BEFORE ISSUING A FINAL NEGATIVE REPORT   Report Status PENDING   Incomplete   CULTURE, BLOOD (ROUTINE X 2)     Status: Normal (Preliminary result)   Collection Time   10/05/12  3:50 PM      Component Value Range Status Comment   Specimen Description BLOOD HAND RIGHT   Final    Special Requests BOTTLES DRAWN AEROBIC AND ANAEROBIC 10CC   Final    Culture  Setup Time 10/05/2012 21:12   Final    Culture     Final    Value:        BLOOD CULTURE RECEIVED NO GROWTH TO DATE CULTURE WILL BE HELD FOR 5 DAYS BEFORE ISSUING A FINAL NEGATIVE  REPORT   Report Status PENDING   Incomplete   URINE CULTURE     Status: Normal   Collection Time   10/05/12  9:30 PM      Component Value Range Status Comment   Specimen Description URINE, RANDOM   Final    Special Requests NONE   Final    Culture  Setup Time 10/06/2012 01:49   Final    Colony Count NO GROWTH   Final    Culture NO GROWTH   Final    Report Status 10/06/2012 FINAL   Final      Labs: Basic Metabolic Panel:  Lab 10/08/12 1610 10/07/12 0901 10/06/12 0850 10/06/12 0540 10/05/12 1735 10/05/12 1332  NA 140 139 135 137 -- 131*  K 3.7 3.4* 3.4* 3.1* -- 2.9*  CL 107 105 103 103 -- 93*  CO2 21 21 20 22  -- 23  GLUCOSE 124* 138* 108* 114* -- 115*  BUN 7 9 22  24* -- 25*  CREATININE 0.76 0.87 1.22* 1.38* 1.30* --  CALCIUM 9.1 9.0 8.8 8.6 -- 9.6  MG -- -- -- -- 2.0 --  PHOS -- -- -- -- -- --   Liver Function Tests:  Lab 10/06/12 0540  AST 48*  ALT 49*  ALKPHOS 75  BILITOT 0.3  PROT 7.0  ALBUMIN 2.5*   CBC:  Lab 10/08/12 0615 10/07/12 0901 10/06/12 0850 10/05/12 1735 10/05/12 1332  WBC 8.5 8.0 11.3* 10.8* 13.6*  NEUTROABS -- -- -- -- 9.5*  HGB 10.0* 9.9* 10.9* 10.0* 12.3  HCT 31.0* 30.5* 32.5* 30.1* 35.0*  MCV 95.1 95.6 94.2 93.5 92.6  PLT 347 250 191 158 179   Results for Anemia Panel 10/08/2012 15:07  Ref. Range 10/07/2012 12:50  Iron Latest Range: 42-135 ug/dL 24 (L)  UIBC Latest Range: 125-400 ug/dL 960  TIBC Latest Range: 250-470 ug/dL 454 (L)  Saturation Ratios Latest Range: 20-55 % 14 (L)  Ferritin Latest Range: 10-291 ng/mL 580 (H)  Folate No range found 12.0  Vitamin B-12 Latest Range: 211-911 pg/mL 1153 (H)   Time coordinating discharge: 30 min  Signed:  Algis Downs, PA-C [facility] Pager: [phone]   10/08/2012, 2:34 PM

## 2012-10-09 NOTE — Discharge Summary (Signed)
-  tolerating diet. -agree with above. cipro PO for 7 days.

## 2012-10-11 LAB — CULTURE, BLOOD (ROUTINE X 2): Culture: NO GROWTH

## 2012-10-11 LAB — EXTRACTABLE NUCLEAR ANTIGEN ANTIBODY
SSA (Ro) (ENA) Antibody, IgG: 10 AU/mL (ref <30)
Scleroderma (Scl-70) (ENA) Antibody, IgG: <1 AU/mL (ref <30)

## 2012-10-12 ENCOUNTER — Encounter: Payer: Self-pay | Admitting: Physician Assistant

## 2012-12-15 ENCOUNTER — Observation Stay (HOSPITAL_COMMUNITY)
Admission: EM | Admit: 2012-12-15 | Discharge: 2012-12-16 | Disposition: A | Discharge status: Home / Self Care | Payer: Medicaid Other | Attending: Emergency Medicine | Admitting: Emergency Medicine

## 2012-12-15 ENCOUNTER — Encounter (HOSPITAL_COMMUNITY): Payer: Self-pay | Admitting: Emergency Medicine

## 2012-12-15 ENCOUNTER — Emergency Department (INDEPENDENT_AMBULATORY_CARE_PROVIDER_SITE_OTHER)
Admission: EM | Admit: 2012-12-15 | Discharge: 2012-12-15 | Disposition: A | Discharge status: Home / Self Care | Payer: Self-pay | Source: Home / Self Care | Attending: Family Medicine | Admitting: Family Medicine

## 2012-12-15 ENCOUNTER — Encounter (HOSPITAL_COMMUNITY): Payer: Self-pay | Admitting: *Deleted

## 2012-12-15 DIAGNOSIS — N12 Tubulo-interstitial nephritis, not specified as acute or chronic: Secondary | ICD-10-CM

## 2012-12-15 DIAGNOSIS — R6883 Chills (without fever): Secondary | ICD-10-CM | POA: Insufficient documentation

## 2012-12-15 DIAGNOSIS — R300 Dysuria: Secondary | ICD-10-CM | POA: Insufficient documentation

## 2012-12-15 DIAGNOSIS — R310 Gross hematuria: Secondary | ICD-10-CM | POA: Insufficient documentation

## 2012-12-15 DIAGNOSIS — M545 Low back pain, unspecified: Principal | ICD-10-CM | POA: Insufficient documentation

## 2012-12-15 DIAGNOSIS — F17200 Nicotine dependence, unspecified, uncomplicated: Secondary | ICD-10-CM | POA: Insufficient documentation

## 2012-12-15 DIAGNOSIS — R110 Nausea: Secondary | ICD-10-CM | POA: Insufficient documentation

## 2012-12-15 DIAGNOSIS — I10 Essential (primary) hypertension: Secondary | ICD-10-CM | POA: Insufficient documentation

## 2012-12-15 LAB — CBC WITH DIFFERENTIAL/PLATELET
Basophils Relative: 0 % (ref 0–1)
Eosinophils Absolute: 0.1 10*3/uL (ref 0.0–0.7)
Hemoglobin: 12.9 g/dL (ref 12.0–15.0)
Lymphs Abs: 1.8 10*3/uL (ref 0.7–4.0)
MCH: 31.3 pg (ref 26.0–34.0)
Monocytes Relative: 5 % (ref 3–12)
Neutro Abs: 4.1 10*3/uL (ref 1.7–7.7)
Neutrophils Relative %: 66 % (ref 43–77)
Platelets: 242 10*3/uL (ref 150–400)
RBC: 4.12 MIL/uL (ref 3.87–5.11)
WBC: 6.2 10*3/uL (ref 4.0–10.5)

## 2012-12-15 LAB — POCT I-STAT, CHEM 8
Calcium, Ion: 1.24 mmol/L — ABNORMAL HIGH (ref 1.12–1.23)
Chloride: 106 mEq/L (ref 96–112)
Glucose, Bld: 98 mg/dL (ref 70–99)
HCT: 41 % (ref 36.0–46.0)
Hemoglobin: 13.9 g/dL (ref 12.0–15.0)
TCO2: 25 mmol/L (ref 0–100)

## 2012-12-15 LAB — POCT URINALYSIS DIP (DEVICE)
Bilirubin Urine: NEGATIVE
Nitrite: POSITIVE — AB
Specific Gravity, Urine: 1.015 (ref 1.005–1.030)
pH: 6 (ref 5.0–8.0)

## 2012-12-15 LAB — POCT PREGNANCY, URINE: Preg Test, Ur: NEGATIVE

## 2012-12-15 MED ORDER — SODIUM CHLORIDE 0.9 % IV SOLN
1000.0000 mL | INTRAVENOUS | Status: DC
  Administered 2012-12-15: 1000 mL via INTRAVENOUS

## 2012-12-15 MED ORDER — CIPROFLOXACIN IN D5W 400 MG/200ML IV SOLN
400.0000 mg | Freq: Two times a day (BID) | INTRAVENOUS | Status: DC
  Administered 2012-12-16: 400 mg via INTRAVENOUS
  Filled 2012-12-15: qty 200

## 2012-12-15 MED ORDER — CEFTRIAXONE SODIUM 1 G IJ SOLR: 1.0000 g | Freq: Once | INTRAMUSCULAR | Status: DC

## 2012-12-15 MED ORDER — HYDROCODONE-ACETAMINOPHEN 5-325 MG PO TABS
ORAL_TABLET | ORAL | Status: AC
  Filled 2012-12-15: qty 1

## 2012-12-15 MED ORDER — SODIUM CHLORIDE 0.9 % IV BOLUS (SEPSIS)
1000.0000 mL | Freq: Once | INTRAVENOUS | Status: AC
  Administered 2012-12-15: 1000 mL via INTRAVENOUS

## 2012-12-15 MED ORDER — CIPROFLOXACIN IN D5W 400 MG/200ML IV SOLN
400.0000 mg | Freq: Once | INTRAVENOUS | Status: AC
  Administered 2012-12-15: 400 mg via INTRAVENOUS
  Filled 2012-12-15: qty 200

## 2012-12-15 MED ORDER — MORPHINE SULFATE 4 MG/ML IJ SOLN
4.0000 mg | Freq: Once | INTRAMUSCULAR | Status: AC
Start: 2012-12-15 — End: 2012-12-15
  Administered 2012-12-15: 4 mg via INTRAVENOUS
  Filled 2012-12-15: qty 1

## 2012-12-15 MED ORDER — MORPHINE SULFATE 4 MG/ML IJ SOLN
4.0000 mg | Freq: Once | INTRAMUSCULAR | Status: AC
  Administered 2012-12-15: 4 mg via INTRAVENOUS
  Filled 2012-12-15: qty 1

## 2012-12-15 MED ORDER — HYDROCODONE-ACETAMINOPHEN 5-325 MG PO TABS
1.0000 | ORAL_TABLET | Freq: Once | ORAL | Status: AC
  Administered 2012-12-15: 1 via ORAL

## 2012-12-15 MED ORDER — ONDANSETRON 4 MG PO TBDP
ORAL_TABLET | ORAL | Status: AC
  Filled 2012-12-15: qty 1

## 2012-12-15 MED ORDER — ONDANSETRON HCL 4 MG/2ML IJ SOLN
4.0000 mg | Freq: Four times a day (QID) | INTRAMUSCULAR | Status: DC | PRN
  Administered 2012-12-15: 4 mg via INTRAVENOUS
  Filled 2012-12-15: qty 2

## 2012-12-15 MED ORDER — ONDANSETRON HCL 4 MG/2ML IJ SOLN
4.0000 mg | Freq: Four times a day (QID) | INTRAMUSCULAR | Status: DC | PRN
  Administered 2012-12-16 (×2): 4 mg via INTRAVENOUS
  Filled 2012-12-15 (×2): qty 2

## 2012-12-15 MED ORDER — ONDANSETRON 4 MG PO TBDP
4.0000 mg | ORAL_TABLET | Freq: Once | ORAL | Status: AC
  Administered 2012-12-15: 4 mg via ORAL

## 2012-12-15 MED ORDER — MORPHINE SULFATE 4 MG/ML IJ SOLN
4.0000 mg | INTRAMUSCULAR | Status: DC | PRN
  Administered 2012-12-16 (×2): 4 mg via INTRAVENOUS
  Filled 2012-12-15 (×2): qty 1

## 2012-12-15 NOTE — ED Provider Notes (Signed)
History     CSN: [id number]  Arrival date & time 12/15/12  1004   First MD Initiated Contact with Patient 12/15/12 1019      Chief Complaint  Patient presents with  . Urinary Tract Infection    (Consider location/radiation/quality/duration/timing/severity/associated sxs/prior treatment) HPI Comments: And 36 year old smoker female with history of recent pyelonephritis infection the hospital admission on October 8. Here complaining of bilateral low back and flank pain mostly on the right side, urinary frequency, burning on urination, hematuria, chills and nausea since yesterday. Denies fever or vomiting. Other than mild nausea patient reports appetite is fine she was able to get dinner last night and feels hungry this morning although has not had breakfast yet. By review of patient's records she did not have hydronephrosis or kidney stones in her renal ultrasound. She did not grow bacteria in the hospital urine culture. Patient was treated with Rocephin and Cipro IV and states that she completed ciprofloxacin he oral treatment for 10 days outpatient. Patient report she was completely asymptomatic after completing her treatment on told yesterday. No history of recurrent urinary infections as a child or in the past before last admission. Last menstrual period was December 5. Neg u-preg test today.  Patient is a 36 y.o. female presenting with urinary tract infection.  Urinary Tract Infection Associated symptoms include abdominal pain. Pertinent negatives include no headaches.    Past Medical History  Diagnosis Date  . Hypertension     Past Surgical History  Procedure Date  . Dilation and curettage of uterus   . Foot surgery     No family history on file.  History  Substance Use Topics  . Smoking status: Current Every Day Smoker -- 0.2 packs/day for 15 years    Types: Cigarettes  . Smokeless tobacco: Never Used  . Alcohol Use: No    OB History    Grav Para Term Preterm  Abortions TAB SAB Ect Mult Living                  Review of Systems  Constitutional: Positive for chills.  Gastrointestinal: Positive for nausea and abdominal pain. Negative for vomiting.  Genitourinary: Positive for dysuria, frequency, hematuria and flank pain. Negative for vaginal bleeding and vaginal discharge.  Skin: Negative for rash.  Neurological: Negative for dizziness and headaches.  All other systems reviewed and are negative.    Allergies  Review of patient's allergies indicates no known allergies.  Home Medications   Current Outpatient Rx  Name  Route  Sig  Dispense  Refill  . CIPROFLOXACIN HCL 750 MG PO TABS   Oral   Take 1 tablet (750 mg total) by mouth 2 (two) times daily.   8 tablet   0   . POLYSACCHARIDE IRON COMPLEX 150 MG PO CAPS   Oral   Take 1 capsule (150 mg total) by mouth daily.   30 capsule   1   . OXYCODONE-ACETAMINOPHEN 5-325 MG PO TABS   Oral   Take 1 tablet by mouth every 4 (four) hours as needed for pain.   20 tablet   0     BP 143/92  Pulse 78  Temp 98.1 F (36.7 C) (Oral)  Resp 18  SpO2 98%  LMP 12/02/2012  Physical Exam  Nursing note and vitals reviewed. Constitutional: She is oriented to person, place, and time. She appears well-developed and well-nourished. No distress.  HENT:  Head: Normocephalic and atraumatic.  Cardiovascular: Normal heart sounds.   Pulmonary/Chest: Breath  sounds normal.  Abdominal: Soft. She exhibits no mass. There is no rebound and no guarding.       Bilateral costovertebral tenderness. Right flank pain. Suprapubic tenderness.  Neurological: She is alert and oriented to person, place, and time.  Skin: No rash noted. She is not diaphoretic.    ED Course  Procedures (including critical care time)  Labs Reviewed  POCT URINALYSIS DIP (DEVICE) - Abnormal; Notable for the following:    Glucose, UA 250 (*)     Hgb urine dipstick LARGE (*)     Protein, ur 30 (*)     Nitrite POSITIVE (*)      Leukocytes, UA LARGE (*)  Biochemical Testing Only. Please order routine urinalysis from main lab if confirmatory testing is needed.   All other components within normal limits  POCT I-STAT, CHEM 8 - Abnormal; Notable for the following:    Calcium, Ion 1.24 (*)     All other components within normal limits  POCT PREGNANCY, URINE  URINE CULTURE   No results found.   1. Pyelonephritis       MDM  36 year old smoker female with history of recent pyelonephritis with a last hospital admission on October 2013. Completed Cipro floxacillin treatment and for 10 days after discharge. Comes today complaining of bilateral low back pain and flank worse in the right side associated with nausea, chills, hematuria, frequency and dysuria. No vomiting. On exam: Patient is afebrile and well hydrated. Positive costovertebral tenderness bilaterally. Suprapubic tenderness. Urinalysis abnormal with large hematuria, proteinuria, positive LE and nitrites. I stat: normal creatinine and electrolytes are also stable. I have concerns about recurrent pyelonephritis and ability of patient to afford outpatient treatment. Decided to transfer to the emergency department for further evaluation and management. Patient had administered or ondansetron 4 mg sublingual x1 and Norco 325/5 mg oral x1. Order for Rocephin IM was canceled a was not administered prior to transfer to the emergency department as patient would like to have IV antibiotics.        Sharin Grave, MD 12/15/12 1122

## 2012-12-15 NOTE — ED Provider Notes (Signed)
Medical screening examination/treatment/procedure(s) were performed by non-physician practitioner and as supervising physician I was immediately available for consultation/collaboration.   Charles B. Bernette Mayers, MD 12/15/12 [phone]

## 2012-12-15 NOTE — ED Notes (Signed)
Was seen at [facility] today had chem8 and ua

## 2012-12-15 NOTE — ED Provider Notes (Signed)
This is a 36 year old female, who presents to the emergency department with chief complaint of blood in urine. She was originally seen by Dr. Judd Lien in fast track. She is transferred to the CDU and placed on pyelonephritis protocol.  5:31 PM Patient denies pain, however she endorses nausea at this time. Denies any other complaints. Discussed pyelonephritis protocol with the patient, and told her that she would likely be spending the night in the CDU to await repeat Cipro in the morning. She understands and agrees with the plan.  PE: Gen: A&O x4 HEENT: PERRL, EOM intact CHEST: RRR, no m/r/g LUNGS: CTAB, no w/r/r ABD: BS x 4, ND/NT EXT: No edema, strong peripheral pulses NEURO: Sensation and strength intact bilaterally  Plan: Per protocol.  10:34 PM I reevaluated the patient several times over the course of her stay in the CDU. Her pain has been managed with morphine. She denies complaints at this time.  11:32 PM Patient care to be resumed by Dr. Freida Busman. Patient history has been discussed with physician resuming care. Patient is in the CDU for pyelonephritis protocol and has no tests pending. Disposition will be per protocol. Patient is currently asymptomatic, in NAD, VSS, and has no current complaints.     Roxy Horseman, PA-C 12/15/12 2333

## 2012-12-15 NOTE — ED Notes (Signed)
Assumed care of pt. Pt reporting 7/10 pain to lower back. PA made aware. Pt resting in bed. Pt aware of plan of care.

## 2012-12-15 NOTE — ED Notes (Signed)
Pt     Reports      Symptoms       Of       Blood  In  Urine         Back  Pain            X  1  Day   Pt  Has  A  History  Of    Kidney  Infection   In        Past         Pt  Takes  No  meds     She  Does  However  Have   Some  Chills  As  Well

## 2012-12-15 NOTE — ED Notes (Signed)
Pt c/o bilateral flank pain, worse on left side. Pt reports it started on Sunday and has progressively gotten worse. Pt tired drinking lots of fluids because she thought it was a UTI but last night she noticed some bld in her urine, then today she noticed it again along with some spotting. Pt reports she already had her period this month and doesn't think it is her menstrual period.

## 2012-12-15 NOTE — ED Notes (Signed)
Report called to Whitney, RN.

## 2012-12-15 NOTE — ED Provider Notes (Signed)
History    This chart was scribed for Geoffery Lyons, MD, MD by Smitty Pluck, ED Scribe. The patient was seen in room TR07C and the patient's care was started at 2:48PM.  CSN: [id number]  Arrival date & time 12/15/12  1139      No chief complaint on file.   (Consider location/radiation/quality/duration/timing/severity/associated sxs/prior treatment) The history is provided by the patient. No language interpreter was used.   Kaylee Simpson is a 36 y.o. female who presents to the Emergency Department complaining of constant, severe lower back pain onset 2 days ago. Pt reports has hx UTI and was admitted to hospital for 4 days in October. During her admission she was given IV antibiotics. She was given ciprofloxacin at discharge. She reports that she was asymptomatic after abx treatment. Pt reports having nausea and chills. She states that she has mild dysuria and blood in urine. She denies fever, vomiting, diarrhea and any other pain. She reports current pain feels same as past UTI symptoms.   Past Medical History  Diagnosis Date  . Hypertension   . Kidney infection     Past Surgical History  Procedure Date  . Dilation and curettage of uterus   . Foot surgery     No family history on file.  History  Substance Use Topics  . Smoking status: Current Every Day Smoker -- 0.2 packs/day for 15 years    Types: Cigarettes  . Smokeless tobacco: Never Used  . Alcohol Use: No    OB History    Grav Para Term Preterm Abortions TAB SAB Ect Mult Living                  Review of Systems  Constitutional: Positive for chills. Negative for fever.  Respiratory: Negative for shortness of breath.   Gastrointestinal: Positive for nausea. Negative for vomiting and diarrhea.  Genitourinary: Positive for dysuria.  Musculoskeletal: Positive for back pain.  Neurological: Negative for weakness.  All other systems reviewed and are negative.    Allergies  Review of patient's allergies indicates  no known allergies.  Home Medications   Current Outpatient Rx  Name  Route  Sig  Dispense  Refill  . CIPROFLOXACIN HCL 750 MG PO TABS   Oral   Take 1 tablet (750 mg total) by mouth 2 (two) times daily.   8 tablet   0   . POLYSACCHARIDE IRON COMPLEX 150 MG PO CAPS   Oral   Take 1 capsule (150 mg total) by mouth daily.   30 capsule   1   . OXYCODONE-ACETAMINOPHEN 5-325 MG PO TABS   Oral   Take 1 tablet by mouth every 4 (four) hours as needed for pain.   20 tablet   0     BP 124/88  Pulse 74  Temp 98.3 F (36.8 C) (Oral)  Resp 16  SpO2 100%  LMP 12/02/2012  Physical Exam  Nursing note and vitals reviewed. Constitutional: She is oriented to person, place, and time. She appears well-developed and well-nourished. No distress.  HENT:  Head: Normocephalic and atraumatic.  Eyes: EOM are normal.  Neck: Neck supple. No tracheal deviation present.  Cardiovascular: Normal rate, regular rhythm and normal heart sounds.   Pulmonary/Chest: Effort normal and breath sounds normal. No respiratory distress. She has no wheezes.  Abdominal: Soft. There is tenderness in the suprapubic area. There is no rebound and no guarding.  Musculoskeletal: Normal range of motion.       Lumbar tenderness to palpation  Neurological: She is alert and oriented to person, place, and time.  Skin: Skin is warm and dry.  Psychiatric: She has a normal mood and affect. Her behavior is normal.    ED Course  Procedures (including critical care time) DIAGNOSTIC STUDIES: Oxygen Saturation is 100% on room air, normal by my interpretation.    COORDINATION OF CARE: 2:57 PM Discussed ED treatment with pt     Labs Reviewed - No data to display No results found.   No diagnosis found.    MDM  Patient seems like she has recurrent pyelonephritis and appears to be a good candidate for the cdu.  I have spoken with Langley Adie who accepts patient to CDU.    I personally performed the services  described in this documentation, which was scribed in my presence. The recorded information has been reviewed and is accurate.        Geoffery Lyons, MD 12/15/12 [phone]

## 2012-12-15 NOTE — ED Notes (Signed)
Discomfort in back x 2 days ago pain yesterday and had blood in urine this am has hx of kidney inrfection in oct

## 2012-12-16 LAB — URINE CULTURE: Culture: NO GROWTH

## 2012-12-16 MED ORDER — ONDANSETRON HCL 4 MG PO TABS: 4.0000 mg | ORAL_TABLET | Freq: Four times a day (QID) | ORAL | Status: DC

## 2012-12-16 MED ORDER — CIPROFLOXACIN HCL 500 MG PO TABS: 500.0000 mg | ORAL_TABLET | Freq: Two times a day (BID) | ORAL | Status: DC

## 2012-12-16 MED ORDER — OXYCODONE-ACETAMINOPHEN 5-325 MG PO TABS: 1.0000 | ORAL_TABLET | Freq: Four times a day (QID) | ORAL | Status: DC | PRN

## 2012-12-16 NOTE — ED Notes (Signed)
Rates pain 3/10, denies nausea, ambulatory to b/r, no changes, steady gait, calm, NAD.

## 2012-12-16 NOTE — ED Notes (Signed)
"  feel about the same as on arrival", pain and nausea continue.

## 2012-12-16 NOTE — ED Provider Notes (Signed)
7:50am  Pt placed in the CDU on Pyelonephritis protocol. She has received 2 rounds of IV Cipro as well as pain/nausea medications.  I have re-evaluated her this morning and she is feeling a little bit better. Her vital signs are a stables and she feels ready to go home. Her PCP is Avbuere and she is to follow-up with him.  Filed Vitals:   12/16/12 0519  BP: 120/67  Pulse: 74  Temp:   Resp: 18   Rx: percocet. Zofran. And Cipro PO.  Pt has been advised of the symptoms that warrant their return to the ED. Patient has voiced understanding and has agreed to follow-up with the PCP or specialist.   Dorthula Matas, PA 12/16/12 [phone]

## 2012-12-16 NOTE — ED Notes (Signed)
Up to b/r, no changes, steady gait, abx infusing.

## 2012-12-16 NOTE — ED Notes (Signed)
Alert, NAD, calm, interactive, skin W&D, resps e/u, speaking in clear complete sentences, CBIR, watching TV, c/o pain and nausea, meds given, IVF infusing.

## 2012-12-16 NOTE — ED Notes (Signed)
Pt c/o lower back pain and headache.  Pt reports that pain meds have helped but pain returns.  Pt alert oriented X4 and asking for breakfast.

## 2012-12-16 NOTE — Discharge Instructions (Signed)
Pyelonephritis, Adult  Pyelonephritis is a kidney infection. In general, there are 2 main types of pyelonephritis:   Infections that come on quickly without any warning (acute pyelonephritis).   Infections that persist for a long period of time (chronic pyelonephritis).  CAUSES   Two main causes of pyelonephritis are:   Bacteria traveling from the bladder to the kidney. This is a problem especially in pregnant women. The urine in the bladder can become filled with bacteria from multiple causes, including:   Inflammation of the prostate gland (prostatitis).   Sexual intercourse in females.   Bladder infection (cystitis).   Bacteria traveling from the bloodstream to the tissue part of the kidney.  Problems that may increase your risk of getting a kidney infection include:   Diabetes.   Kidney stones or bladder stones.   Cancer.   Catheters placed in the bladder.   Other abnormalities of the kidney or ureter.  SYMPTOMS    Abdominal pain.   Pain in the side or flank area.   Fever.   Chills.   Upset stomach.   Blood in the urine (dark urine).   Frequent urination.   Strong or persistent urge to urinate.   Burning or stinging when urinating.  DIAGNOSIS   Your caregiver may diagnose your kidney infection based on your symptoms. A urine sample may also be taken.  TREATMENT   In general, treatment depends on how severe the infection is.    If the infection is mild and caught early, your caregiver may treat you with oral antibiotics and send you home.   If the infection is more severe, the bacteria may have gotten into the bloodstream. This will require intravenous (IV) antibiotics and a hospital stay. Symptoms may include:   High fever.   Severe flank pain.   Shaking chills.   Even after a hospital stay, your caregiver may require you to be on oral antibiotics for a period of time.   Other treatments may be required depending upon the cause of the infection.  HOME CARE INSTRUCTIONS    Take your  antibiotics as directed. Finish them even if you start to feel better.   Make an appointment to have your urine checked to make sure the infection is gone.   Drink enough fluids to keep your urine clear or pale yellow.   Take medicines for the bladder if you have urgency and frequency of urination as directed by your caregiver.  SEEK IMMEDIATE MEDICAL CARE IF:    You have a fever or persistent symptoms for more than 2-3 days.   You have a fever and your symptoms suddenly get worse.   You are unable to take your antibiotics or fluids.   You develop shaking chills.   You experience extreme weakness or fainting.   There is no improvement after 2 days of treatment.  MAKE SURE YOU:   Understand these instructions.   Will watch your condition.   Will get help right away if you are not doing well or get worse.  Document Released: 12/15/2005 Document Revised: 06/15/2012 Document Reviewed: 05/21/2011  Exit[employer] Patient Information 2013 [employer].

## 2012-12-17 NOTE — ED Provider Notes (Signed)
Medical screening examination/treatment/procedure(s) were performed by non-physician practitioner and as supervising physician I was immediately available for consultation/collaboration.   Joya Gaskins, MD 12/17/12 [phone]

## 2013-04-07 ENCOUNTER — Encounter (HOSPITAL_COMMUNITY): Payer: Self-pay | Admitting: Emergency Medicine

## 2013-04-07 ENCOUNTER — Other Ambulatory Visit (HOSPITAL_COMMUNITY)
Admission: RE | Admit: 2013-04-07 | Discharge: 2013-04-07 | Disposition: A | Discharge status: Home / Self Care | Payer: Medicaid Other | Source: Ambulatory Visit | Attending: Emergency Medicine | Admitting: Emergency Medicine

## 2013-04-07 ENCOUNTER — Emergency Department (HOSPITAL_COMMUNITY)
Admission: EM | Admit: 2013-04-07 | Discharge: 2013-04-07 | Disposition: A | Discharge status: Home / Self Care | Payer: Medicaid Other | Source: Home / Self Care | Attending: Emergency Medicine | Admitting: Emergency Medicine

## 2013-04-07 DIAGNOSIS — Z113 Encounter for screening for infections with a predominantly sexual mode of transmission: Secondary | ICD-10-CM | POA: Insufficient documentation

## 2013-04-07 DIAGNOSIS — N760 Acute vaginitis: Secondary | ICD-10-CM | POA: Insufficient documentation

## 2013-04-07 DIAGNOSIS — N12 Tubulo-interstitial nephritis, not specified as acute or chronic: Secondary | ICD-10-CM

## 2013-04-07 LAB — POCT URINALYSIS DIP (DEVICE)
Bilirubin Urine: NEGATIVE
Glucose, UA: NEGATIVE mg/dL
Hgb urine dipstick: NEGATIVE
Ketones, ur: NEGATIVE mg/dL
Nitrite: NEGATIVE
pH: 7 (ref 5.0–8.0)

## 2013-04-07 MED ORDER — IBUPROFEN 800 MG PO TABS
800.0000 mg | ORAL_TABLET | Freq: Once | ORAL | Status: AC
  Administered 2013-04-07: 800 mg via ORAL

## 2013-04-07 MED ORDER — LIDOCAINE HCL (PF) 1 % IJ SOLN
INTRAMUSCULAR | Status: AC
  Filled 2013-04-07: qty 5

## 2013-04-07 MED ORDER — CEFTRIAXONE SODIUM 1 G IJ SOLR
1.0000 g | Freq: Once | INTRAMUSCULAR | Status: AC
  Administered 2013-04-07: 1 g via INTRAMUSCULAR

## 2013-04-07 MED ORDER — HYDROCODONE-ACETAMINOPHEN 5-325 MG PO TABS: ORAL_TABLET | ORAL | Status: DC

## 2013-04-07 MED ORDER — CEFTRIAXONE SODIUM 1 G IJ SOLR
INTRAMUSCULAR | Status: AC
  Filled 2013-04-07: qty 10

## 2013-04-07 MED ORDER — IBUPROFEN 800 MG PO TABS
ORAL_TABLET | ORAL | Status: AC
  Filled 2013-04-07: qty 1

## 2013-04-07 MED ORDER — CIPROFLOXACIN HCL 500 MG PO TABS: 500.0000 mg | ORAL_TABLET | Freq: Two times a day (BID) | ORAL | Status: DC

## 2013-04-07 NOTE — ED Notes (Signed)
Pt is here for poss UTI onset Tuesday Sx include: back pain, abd pain, nauseas, dysuria, chills, white vag discharge, foul odor to urine Denies: hematuria, f/v/d Hx of freq UTI's and has been hosp for it Increased water intake and taking AZO   She is alert and oriented w/no signs of acute distress.

## 2013-04-07 NOTE — ED Provider Notes (Signed)
Chief Complaint:   Chief Complaint  Patient presents with  . Urinary Tract Infection    History of Present Illness:   Kaylee Simpson is a 37 year old female who has had a three-day history of pain in her bilateral CVA area, worse on the right than the left, nausea, chills, dysuria, and a strong odor to her urine. She denies fever or vomiting. She has had no frequency, urgency, or hematuria. She has had a slight vaginal discharge but denies any itching or odor. She denies pelvic pain. She has had Provera injections and has been years since her last menses. The patient has had 3 episodes of pyelonephritis within the past year. She has not seen a urologist yet. Her last episode was in December.  Review of Systems:  Other than noted above, the patient denies any of the following symptoms: General:  No fevers, chills, sweats, aches, or fatigue. GI:  No abdominal pain, back pain, nausea, vomiting, diarrhea, or constipation. GU:  No dysuria, frequency, urgency, hematuria, or incontinence. GYN:  No discharge, itching, vulvar pain or lesions, pelvic pain, or abnormal vaginal bleeding.  PMFSH:  Past medical history, family history, social history, meds, and allergies were reviewed.  She takes Flexeril and trazodone. She has a history of high blood pressure but is not taking any medication for this right now.  Physical Exam:   Vital signs:  BP 139/95  Pulse 81  Temp(Src) 98.8 F (37.1 C) (Oral)  Resp 16  SpO2 99% Gen:  Alert, oriented, in no distress. Lungs:  Clear to auscultation, no wheezes, rales or rhonchi. Heart:  Regular rhythm, no gallop or murmer. Abdomen:  Flat and soft. There is mild, diffuse abdominal tenderness to palpation.  No guarding, or rebound.  No hepato-splenomegaly or mass.  Bowel sounds were normally active.  No hernia. Pelvic exam:  Normal external genitalia. Vaginal and cervical mucosa were normal. There was no vaginal discharge with slight odor. There was no pain on cervical  motion. Uterus was normal in size and shape and nontender. No tubo-ovarian mass or tenderness. Back:  She has bilateral CVA tenderness worse on the right than the left.  Skin:  Clear, warm and dry.  Labs:   Results for orders placed during the hospital encounter of 04/07/13  POCT URINALYSIS DIP (DEVICE)      Result Value Range   Glucose, UA NEGATIVE  NEGATIVE mg/dL   Bilirubin Urine NEGATIVE  NEGATIVE   Ketones, ur NEGATIVE  NEGATIVE mg/dL   Specific Gravity, Urine 1.020  1.005 - 1.030   Hgb urine dipstick NEGATIVE  NEGATIVE   pH 7.0  5.0 - 8.0   Protein, ur NEGATIVE  NEGATIVE mg/dL   Urobilinogen, UA 0.2  0.0 - 1.0 mg/dL   Nitrite NEGATIVE  NEGATIVE   Leukocytes, UA SMALL (*) NEGATIVE  POCT PREGNANCY, URINE      Result Value Range   Preg Test, Ur NEGATIVE  NEGATIVE     Other Labs Obtained at Urgent Care Center:  A urine culture was obtained.  Results are pending at this time and we will call about any positive results.  Course in Urgent Care Center:   She was given Rocephin 1 g IM and ibuprofen 800 mg by mouth for pain.  Assessment: The encounter diagnosis was Pyelonephritis.   She has had now 4 episodes of pyelonephritis within the past year and she needs to see a urologist. She was advised to followup with her primary care physician in 2 weeks for a  repeat urine culture, return here in 48 hours, and see Dr. Lorin Picket McDiarmid in 2 weeks for urological followup.  Plan:   1.  The following meds were prescribed:   New Prescriptions   CIPROFLOXACIN (CIPRO) 500 MG TABLET    Take 1 tablet (500 mg total) by mouth every 12 (twelve) hours.   HYDROCODONE-ACETAMINOPHEN (NORCO/VICODIN) 5-325 MG PER TABLET    1 to 2 tabs every 4 to 6 hours as needed for pain.   2.  The patient was instructed in symptomatic care and handouts were given. 3.  The patient was told to return if becoming worse in any way, if no better in 3 or 4 days, and given some red flag symptoms such as fever, vomiting, or  increasing pain that would indicate earlier return. 4.  The patient was told to avoid intercourse for 10 days, get extra fluids, and return for a follow up with her primary care doctor at the completion of treatment for a repeat UA and culture.     Reuben Likes, MD 04/07/13 [phone]

## 2013-04-08 LAB — URINE CULTURE

## 2013-04-08 NOTE — ED Notes (Addendum)
GC/Chlamydia neg., Candida neg., Gardnerella and Trich pos. Urine culture: No growth.  Message sent to Dr. Lorenz Coaster for orders. Vassie Moselle 04/08/2013

## 2013-04-09 ENCOUNTER — Encounter (HOSPITAL_COMMUNITY): Payer: Self-pay | Admitting: *Deleted

## 2013-04-09 ENCOUNTER — Emergency Department (HOSPITAL_COMMUNITY)
Admission: EM | Admit: 2013-04-09 | Discharge: 2013-04-09 | Disposition: A | Discharge status: Home / Self Care | Payer: Medicaid Other | Attending: Emergency Medicine | Admitting: Emergency Medicine

## 2013-04-09 DIAGNOSIS — F17200 Nicotine dependence, unspecified, uncomplicated: Secondary | ICD-10-CM | POA: Insufficient documentation

## 2013-04-09 DIAGNOSIS — I10 Essential (primary) hypertension: Secondary | ICD-10-CM | POA: Insufficient documentation

## 2013-04-09 DIAGNOSIS — Z79899 Other long term (current) drug therapy: Secondary | ICD-10-CM | POA: Insufficient documentation

## 2013-04-09 DIAGNOSIS — R112 Nausea with vomiting, unspecified: Secondary | ICD-10-CM | POA: Insufficient documentation

## 2013-04-09 DIAGNOSIS — Z3202 Encounter for pregnancy test, result negative: Secondary | ICD-10-CM | POA: Insufficient documentation

## 2013-04-09 DIAGNOSIS — N12 Tubulo-interstitial nephritis, not specified as acute or chronic: Secondary | ICD-10-CM

## 2013-04-09 DIAGNOSIS — R6883 Chills (without fever): Secondary | ICD-10-CM | POA: Insufficient documentation

## 2013-04-09 DIAGNOSIS — R109 Unspecified abdominal pain: Secondary | ICD-10-CM | POA: Insufficient documentation

## 2013-04-09 DIAGNOSIS — Z87448 Personal history of other diseases of urinary system: Secondary | ICD-10-CM | POA: Insufficient documentation

## 2013-04-09 LAB — CBC WITH DIFFERENTIAL/PLATELET
Eosinophils Relative: 1 % (ref 0–5)
HCT: 36.7 % (ref 36.0–46.0)
Lymphocytes Relative: 36 % (ref 12–46)
Lymphs Abs: 3.8 10*3/uL (ref 0.7–4.0)
MCV: 92.2 fL (ref 78.0–100.0)
Monocytes Absolute: 0.8 10*3/uL (ref 0.1–1.0)
Monocytes Relative: 7 % (ref 3–12)
RBC: 3.98 MIL/uL (ref 3.87–5.11)
WBC: 10.7 10*3/uL — ABNORMAL HIGH (ref 4.0–10.5)

## 2013-04-09 LAB — URINALYSIS, MICROSCOPIC ONLY
Hgb urine dipstick: NEGATIVE
Protein, ur: NEGATIVE mg/dL
Urobilinogen, UA: 0.2 mg/dL (ref 0.0–1.0)

## 2013-04-09 LAB — COMPREHENSIVE METABOLIC PANEL
ALT: 20 U/L (ref 0–35)
CO2: 23 mEq/L (ref 19–32)
Calcium: 8.8 mg/dL (ref 8.4–10.5)
Creatinine, Ser: 0.72 mg/dL (ref 0.50–1.10)
GFR calc Af Amer: >90 mL/min (ref >90)
GFR calc non Af Amer: >90 mL/min (ref >90)
Glucose, Bld: 88 mg/dL (ref 70–99)
Sodium: 137 mEq/L (ref 135–145)

## 2013-04-09 LAB — POCT PREGNANCY, URINE: Preg Test, Ur: NEGATIVE

## 2013-04-09 MED ORDER — MORPHINE SULFATE 4 MG/ML IJ SOLN
4.0000 mg | Freq: Once | INTRAMUSCULAR | Status: AC
  Administered 2013-04-09: 4 mg via INTRAVENOUS
  Filled 2013-04-09: qty 1

## 2013-04-09 MED ORDER — PROMETHAZINE HCL 25 MG RE SUPP: 25.0000 mg | Freq: Four times a day (QID) | RECTAL | Status: DC | PRN

## 2013-04-09 MED ORDER — CIPROFLOXACIN IN D5W 400 MG/200ML IV SOLN
400.0000 mg | Freq: Once | INTRAVENOUS | Status: AC
  Administered 2013-04-09: 400 mg via INTRAVENOUS
  Filled 2013-04-09: qty 200

## 2013-04-09 MED ORDER — ONDANSETRON HCL 4 MG/2ML IJ SOLN
4.0000 mg | Freq: Once | INTRAMUSCULAR | Status: AC
  Administered 2013-04-09: 4 mg via INTRAVENOUS
  Filled 2013-04-09: qty 2

## 2013-04-09 MED ORDER — ONDANSETRON 8 MG PO TBDP: 8.0000 mg | ORAL_TABLET | Freq: Three times a day (TID) | ORAL | Status: DC | PRN

## 2013-04-09 MED ORDER — SODIUM CHLORIDE 0.9 % IV BOLUS (SEPSIS)
1000.0000 mL | Freq: Once | INTRAVENOUS | Status: AC
  Administered 2013-04-09: 1000 mL via INTRAVENOUS

## 2013-04-09 MED ORDER — OXYCODONE-ACETAMINOPHEN 5-325 MG PO TABS: 1.0000 | ORAL_TABLET | ORAL | Status: DC | PRN

## 2013-04-09 MED ORDER — HYDROCODONE-ACETAMINOPHEN 5-325 MG PO TABS
2.0000 | ORAL_TABLET | Freq: Once | ORAL | Status: AC
  Administered 2013-04-09: 2 via ORAL
  Filled 2013-04-09: qty 2

## 2013-04-09 MED ORDER — ONDANSETRON 4 MG PO TBDP
8.0000 mg | ORAL_TABLET | Freq: Once | ORAL | Status: AC
  Administered 2013-04-09: 8 mg via ORAL
  Filled 2013-04-09: qty 2

## 2013-04-09 NOTE — ED Provider Notes (Addendum)
History     CSN: [id number]  Arrival date & time 04/09/13  1918   First MD Initiated Contact with Patient 04/09/13 1947      Chief Complaint  Patient presents with  . Emesis    (Consider location/radiation/quality/duration/timing/severity/associated sxs/prior treatment) HPI Comments: Pt with no medical hx, recent dx of pyelonephritis, on cipro comes in with cc of emesis, abd pain. Pt states that she started having worsening pain yday, and new chills and emesis, with emesis x 3 today. She has been unable to tolerate her meds. She has no known fevers. Pt has no hx of renal stones. She had pelvic exam and evaluation done last visit, and the findings were normal.  Patient is a 37 y.o. female presenting with vomiting. The history is provided by the patient and medical records.  Emesis Associated symptoms: abdominal pain   Associated symptoms: no headaches     Past Medical History  Diagnosis Date  . Hypertension   . Kidney infection     Past Surgical History  Procedure Laterality Date  . Dilation and curettage of uterus    . Foot surgery      History reviewed. No pertinent family history.  History  Substance Use Topics  . Smoking status: Current Every Day Smoker -- 0.25 packs/day for 15 years    Types: Cigarettes  . Smokeless tobacco: Never Used  . Alcohol Use: Yes    OB History   Grav Para Term Preterm Abortions TAB SAB Ect Mult Living                  Review of Systems  Constitutional: Positive for activity change.  HENT: Negative for neck pain.   Respiratory: Negative for shortness of breath.   Cardiovascular: Negative for chest pain.  Gastrointestinal: Positive for nausea, vomiting and abdominal pain.  Genitourinary: Negative for dysuria.  Neurological: Negative for headaches.    Allergies  Review of patient's allergies indicates no known allergies.  Home Medications   Current Outpatient Rx  Name  Route  Sig  Dispense  Refill  . CALCIUM PO   Oral    Take 1 tablet by mouth 3 (three) times a week. Take 1 tablet on Sun, Tues and Thurs         . ciprofloxacin (CIPRO) 500 MG tablet   Oral   Take 1 tablet (500 mg total) by mouth every 12 (twelve) hours.   20 tablet   0   . HYDROcodone-acetaminophen (NORCO/VICODIN) 5-325 MG per tablet      1 to 2 tabs every 4 to 6 hours as needed for pain.   20 tablet   0   . iron polysaccharides (NIFEREX) 150 MG capsule   Oral   Take 150 mg by mouth 3 (three) times a week. Take 1 tablet on Sun, Tues and Thurs         . traZODone (DESYREL) 50 MG tablet   Oral   Take 50 mg by mouth at bedtime.           BP 143/90  Pulse 98  Temp(Src) 98.3 F (36.8 C)  Resp 20  SpO2 99%  Physical Exam  Nursing note and vitals reviewed. Constitutional: She is oriented to person, place, and time. She appears well-developed and well-nourished.  HENT:  Head: Normocephalic and atraumatic.  Eyes: EOM are normal. Pupils are equal, round, and reactive to light.  Neck: Neck supple.  Cardiovascular: Normal rate, regular rhythm and normal heart sounds.   No  murmur heard. Pulmonary/Chest: Effort normal. No respiratory distress.  Abdominal: Soft. She exhibits no distension. There is tenderness. There is no rebound and no guarding.  Bilateral flank tenderness  Neurological: She is alert and oriented to person, place, and time.  Skin: Skin is warm and dry.    ED Course  Procedures (including critical care time)  Labs Reviewed  CBC WITH DIFFERENTIAL - Abnormal; Notable for the following:    WBC 10.7 (*)    All other components within normal limits  URINALYSIS, MICROSCOPIC ONLY - Abnormal; Notable for the following:    APPearance CLOUDY (*)    Leukocytes, UA SMALL (*)    Bacteria, UA FEW (*)    Squamous Epithelial / LPF FEW (*)    All other components within normal limits  URINE CULTURE  COMPREHENSIVE METABOLIC PANEL  LIPASE, BLOOD  POCT PREGNANCY, URINE   No results found.   No diagnosis  found.    MDM  Pt comes in with cc of flank pain, bilateral. Recent dx of pyelonephritis. PT also has nausea, and emesis, and was unable to tolerate home meds. Pt was not given any nausea meds - so we will give iv dose of cipro and start fluid challenge.   Derwood Kaplan, MD 04/09/13 2109  11:04 PM Pt's pain is persistent, but her nausea improved. Will d/c soon - with pain meds, nausea meds.   Derwood Kaplan, MD 04/09/13 [phone]

## 2013-04-09 NOTE — ED Notes (Signed)
Pt c/o n/v/d x several days, states she went to [facility] on Thursday and was dx with pylonephritis.  Denies pain with urination or urinary frequency

## 2013-04-11 ENCOUNTER — Telehealth (HOSPITAL_COMMUNITY): Payer: Self-pay | Admitting: *Deleted

## 2013-04-11 ENCOUNTER — Telehealth (HOSPITAL_COMMUNITY): Payer: Self-pay | Admitting: Emergency Medicine

## 2013-04-11 LAB — URINE CULTURE

## 2013-04-11 MED ORDER — METRONIDAZOLE 500 MG PO TABS: 500.0000 mg | ORAL_TABLET | Freq: Two times a day (BID) | ORAL | Status: DC

## 2013-04-11 NOTE — ED Notes (Signed)
4/14 Order obtained for Flagyl from Dr. Lorenz Coaster.  I called pt.  Pt. verified x 2 and given results.  Pt. told she needs Flagyl for bacterial vaginosis and Trich.   Pt. instructed to no alcohol while taking this medication.  Pt. instructed  to notify her partner to be treated with Flagyl, no sex until you have finished your medication and your partner has been treated and to practice safe sex. Pt. states he is in jail.  I'm not going to have anything else to do with him.  I told her she still needs to notify him, so he does not pass it to anyone else and he get treated in jail. She said she would. Pt. told her Rx. is at the [facility] on [address]. Pt. voiced understanding. Vassie Moselle 04/11/2013

## 2013-04-11 NOTE — ED Notes (Signed)
Gardnerella and Trichomonas DNA probe are positive. Will treat with Flagyl 500 mg twice a day for one week.  Reuben Likes, MD 04/11/13 [phone]

## 2013-04-11 NOTE — Telephone Encounter (Signed)
Message copied by Reuben Likes on Mon Apr 11, 2013  9:43 AM ------      Message from: Vassie Moselle      Created: Fri Apr 08, 2013  9:19 PM      Regarding: labs       GC/Chlamydia neg., Candida neg., Gardnerella and Trich pos.  Needs treatment.      Vassie Moselle      04/08/2013       ------

## 2013-09-30 ENCOUNTER — Emergency Department (HOSPITAL_COMMUNITY)
Admission: EM | Admit: 2013-09-30 | Discharge: 2013-09-30 | Disposition: A | Discharge status: Home / Self Care | Payer: Medicaid Other | Source: Home / Self Care | Attending: Family Medicine | Admitting: Family Medicine

## 2013-09-30 ENCOUNTER — Encounter (HOSPITAL_COMMUNITY): Payer: Self-pay | Admitting: Emergency Medicine

## 2013-09-30 DIAGNOSIS — N390 Urinary tract infection, site not specified: Secondary | ICD-10-CM

## 2013-09-30 LAB — POCT URINALYSIS DIP (DEVICE)
Bilirubin Urine: NEGATIVE
Glucose, UA: NEGATIVE mg/dL
Nitrite: NEGATIVE

## 2013-09-30 LAB — POCT PREGNANCY, URINE: Preg Test, Ur: NEGATIVE

## 2013-09-30 MED ORDER — CEPHALEXIN 500 MG PO CAPS: 500.0000 mg | ORAL_CAPSULE | Freq: Three times a day (TID) | ORAL | Status: DC

## 2013-09-30 NOTE — ED Notes (Signed)
Patient's pcp is "[facility]"

## 2013-09-30 NOTE — ED Provider Notes (Signed)
Kaylee Simpson is a 37 y.o. female who presents to Urgent Care today for left low back pain associated with urinary frequency and dark urine. This is also associated with chills and nausea. She denies any vomiting fevers diarrhea. She used AZO which has helped some. She additionally has used a heating pad which has helped. Her symptoms are consistent with prior episodes of urinary tract infection. She denies any vaginal discharge.   Past Medical History  Diagnosis Date  . Hypertension   . Kidney infection    History  Substance Use Topics  . Smoking status: Current Every Day Smoker -- 0.25 packs/day for 15 years    Types: Cigarettes  . Smokeless tobacco: Never Used  . Alcohol Use: Yes   ROS as above Medications reviewed. No current facility-administered medications for this encounter.   Current Outpatient Prescriptions  Medication Sig Dispense Refill  . aspirin 81 MG tablet Take 81 mg by mouth daily.      . Phenazopyridine HCl (AZO TABS PO) Take by mouth.      Marland Kitchen CALCIUM PO Take 1 tablet by mouth 3 (three) times a week. Take 1 tablet on Sun, Tues and Thurs      . cephALEXin (KEFLEX) 500 MG capsule Take 1 capsule (500 mg total) by mouth 3 (three) times daily.  30 capsule  0  . iron polysaccharides (NIFEREX) 150 MG capsule Take 150 mg by mouth 3 (three) times a week. Take 1 tablet on Sun, Tues and Thurs      . traZODone (DESYREL) 50 MG tablet Take 50 mg by mouth at bedtime.        Exam:  BP 140/97  Pulse 79  Temp(Src) 98.1 F (36.7 C) (Oral)  Resp 16  SpO2 100%  LMP 09/15/2013 Gen: Well NAD, nontoxic appearing HEENT: EOMI,  MMM Lungs: CTABL Nl WOB Heart: RRR no MRG Abd: NABS, NT, ND, mild left CV angle tenderness to percussion Exts: Non edematous BL  LE, warm and well perfused.   Results for orders placed during the hospital encounter of 09/30/13 (from the past 24 hour(s))  POCT URINALYSIS DIP (DEVICE)     Status: Abnormal   Collection Time    09/30/13 10:31 AM      Result  Value Range   Glucose, UA NEGATIVE  NEGATIVE mg/dL   Bilirubin Urine NEGATIVE  NEGATIVE   Ketones, ur NEGATIVE  NEGATIVE mg/dL   Specific Gravity, Urine 1.025  1.005 - 1.030   Hgb urine dipstick NEGATIVE  NEGATIVE   pH 7.0  5.0 - 8.0   Protein, ur NEGATIVE  NEGATIVE mg/dL   Urobilinogen, UA 0.2  0.0 - 1.0 mg/dL   Nitrite NEGATIVE  NEGATIVE   Leukocytes, UA TRACE (*) NEGATIVE  POCT PREGNANCY, URINE     Status: None   Collection Time    09/30/13 10:36 AM      Result Value Range   Preg Test, Ur NEGATIVE  NEGATIVE   No results found.  Assessment and Plan: 37 y.o. female with urinary tract infection.  Possible very early pyelonephritis however this is somewhat doubtful given patient's very weakly positive urinalysis.  Plan for urine culture and treat with empiric Keflex Presents to the emergency room if worsening Discussed warning signs or symptoms. Please see discharge instructions. Patient expresses understanding.      Rodolph Bong, MD 09/30/13 [phone]

## 2013-09-30 NOTE — ED Notes (Signed)
Lower back pain, chills, nausea, dark urine.  Same symptoms as in the past when diagnosed with poly nephritis.

## 2013-09-30 NOTE — ED Notes (Signed)
Dr Denyse Amass at bedside prior to this nurse

## 2013-10-01 LAB — URINE CULTURE

## 2013-11-17 ENCOUNTER — Emergency Department (HOSPITAL_COMMUNITY)
Admission: EM | Admit: 2013-11-17 | Discharge: 2013-11-17 | Disposition: A | Discharge status: Home / Self Care | Payer: Medicaid Other | Attending: Emergency Medicine | Admitting: Emergency Medicine

## 2013-11-17 ENCOUNTER — Encounter (HOSPITAL_COMMUNITY): Payer: Self-pay | Admitting: Emergency Medicine

## 2013-11-17 DIAGNOSIS — M545 Low back pain, unspecified: Secondary | ICD-10-CM | POA: Insufficient documentation

## 2013-11-17 DIAGNOSIS — F17200 Nicotine dependence, unspecified, uncomplicated: Secondary | ICD-10-CM | POA: Insufficient documentation

## 2013-11-17 DIAGNOSIS — Z3202 Encounter for pregnancy test, result negative: Secondary | ICD-10-CM | POA: Insufficient documentation

## 2013-11-17 DIAGNOSIS — I10 Essential (primary) hypertension: Secondary | ICD-10-CM | POA: Insufficient documentation

## 2013-11-17 DIAGNOSIS — Z79899 Other long term (current) drug therapy: Secondary | ICD-10-CM | POA: Insufficient documentation

## 2013-11-17 DIAGNOSIS — R112 Nausea with vomiting, unspecified: Secondary | ICD-10-CM | POA: Insufficient documentation

## 2013-11-17 DIAGNOSIS — Z87448 Personal history of other diseases of urinary system: Secondary | ICD-10-CM | POA: Insufficient documentation

## 2013-11-17 DIAGNOSIS — M549 Dorsalgia, unspecified: Secondary | ICD-10-CM

## 2013-11-17 LAB — COMPREHENSIVE METABOLIC PANEL
ALT: 16 U/L (ref 0–35)
AST: 22 U/L (ref 0–37)
Alkaline Phosphatase: 58 U/L (ref 39–117)
CO2: 22 mEq/L (ref 19–32)
GFR calc Af Amer: >90 mL/min (ref >90)
Glucose, Bld: 89 mg/dL (ref 70–99)
Potassium: 3.6 mEq/L (ref 3.5–5.1)
Sodium: 134 mEq/L — ABNORMAL LOW (ref 135–145)
Total Protein: 8 g/dL (ref 6.0–8.3)

## 2013-11-17 LAB — URINALYSIS, ROUTINE W REFLEX MICROSCOPIC
Hgb urine dipstick: NEGATIVE
Protein, ur: 30 mg/dL — AB
Specific Gravity, Urine: 1.03 (ref 1.005–1.030)
Urobilinogen, UA: 1 mg/dL (ref 0.0–1.0)
pH: 6 (ref 5.0–8.0)

## 2013-11-17 LAB — URINE MICROSCOPIC-ADD ON

## 2013-11-17 LAB — POCT PREGNANCY, URINE: Preg Test, Ur: NEGATIVE

## 2013-11-17 LAB — CBC WITH DIFFERENTIAL/PLATELET
Basophils Absolute: 0 10*3/uL (ref 0.0–0.1)
Eosinophils Absolute: 0 10*3/uL (ref 0.0–0.7)
Lymphocytes Relative: 26 % (ref 12–46)
Lymphs Abs: 1.8 10*3/uL (ref 0.7–4.0)
Neutrophils Relative %: 67 % (ref 43–77)
Platelets: 242 10*3/uL (ref 150–400)
RBC: 4.18 MIL/uL (ref 3.87–5.11)
RDW: 12.2 % (ref 11.5–15.5)
WBC: 7.1 10*3/uL (ref 4.0–10.5)

## 2013-11-17 MED ORDER — MORPHINE SULFATE 4 MG/ML IJ SOLN
4.0000 mg | Freq: Once | INTRAMUSCULAR | Status: AC
  Administered 2013-11-17: 4 mg via INTRAVENOUS
  Filled 2013-11-17: qty 1

## 2013-11-17 MED ORDER — OXYCODONE-ACETAMINOPHEN 5-325 MG PO TABS: 2.0000 | ORAL_TABLET | ORAL | Status: DC | PRN

## 2013-11-17 MED ORDER — KETOROLAC TROMETHAMINE 30 MG/ML IJ SOLN
30.0000 mg | Freq: Once | INTRAMUSCULAR | Status: AC
  Administered 2013-11-17: 30 mg via INTRAVENOUS
  Filled 2013-11-17: qty 1

## 2013-11-17 MED ORDER — ONDANSETRON 4 MG PO TBDP: 4.0000 mg | ORAL_TABLET | Freq: Three times a day (TID) | ORAL | Status: DC | PRN

## 2013-11-17 MED ORDER — DIAZEPAM 5 MG/ML IJ SOLN
2.5000 mg | Freq: Once | INTRAMUSCULAR | Status: AC
  Administered 2013-11-17: 2.5 mg via INTRAVENOUS
  Filled 2013-11-17: qty 2

## 2013-11-17 MED ORDER — ONDANSETRON 4 MG PO TBDP
8.0000 mg | ORAL_TABLET | Freq: Once | ORAL | Status: AC
  Administered 2013-11-17: 8 mg via ORAL
  Filled 2013-11-17: qty 2

## 2013-11-17 NOTE — ED Notes (Signed)
Pt in c/o lower back pain, worse to left side, pain with urination, and today patient started vomiting, states she has a history of UTI's and pyelonephritis, states this feels similar, denies fever at home but has had chills

## 2013-11-17 NOTE — ED Provider Notes (Signed)
CSN: [id number]     Arrival date & time 11/17/13  1618 History   First MD Initiated Contact with Patient 11/17/13 1846     Chief Complaint  Patient presents with  . Back Pain  . Emesis   (Consider location/radiation/quality/duration/timing/severity/associated sxs/prior Treatment) HPI Comments: Patient is a 37 year old female with a past medical history of pyelonephritis who presents with left flank pain for the past 2 days. The pain is located in her left flank and does not radiate. The pain is described as cramping and severe. The pain started gradually and progressively worsened since the onset. No alleviating/aggravating factors. The patient has tried drinking water for symptoms without relief. Associated symptoms include nausea and vomiting. Patient reports this feels like when she had pyelonephritis. Patient denies fever, headache, diarrhea, chest pain, SOB, dysuria, constipation, abnormal vaginal bleeding/discharge.      Past Medical History  Diagnosis Date  . Hypertension   . Kidney infection    Past Surgical History  Procedure Laterality Date  . Dilation and curettage of uterus    . Foot surgery     History reviewed. No pertinent family history. History  Substance Use Topics  . Smoking status: Current Every Day Smoker -- 0.25 packs/day for 15 years    Types: Cigarettes  . Smokeless tobacco: Never Used  . Alcohol Use: Yes   OB History   Grav Para Term Preterm Abortions TAB SAB Ect Mult Living                 Review of Systems  Gastrointestinal: Positive for nausea and vomiting.  Musculoskeletal: Positive for back pain.  All other systems reviewed and are negative.    Allergies  Review of patient's allergies indicates no known allergies.  Home Medications   Current Outpatient Rx  Name  Route  Sig  Dispense  Refill  . aspirin-acetaminophen-caffeine (EXCEDRIN MIGRAINE) 250-250-65 MG per tablet   Oral   Take 2 tablets by mouth every 6 (six) hours as needed for  headache.         Marland Kitchen DM-Doxylamine-Acetaminophen (NYQUIL COLD & FLU PO)   Oral   Take 30 mLs by mouth at bedtime as needed (for cold).         . iron polysaccharides (NIFEREX) 150 MG capsule   Oral   Take 150 mg by mouth 3 (three) times a week. Take 1 tablet on Sun, Tues and Thurs         . ondansetron (ZOFRAN-ODT) 4 MG disintegrating tablet   Oral   Take 4 mg by mouth every 8 (eight) hours as needed for nausea or vomiting.         . Pseudoephedrine-APAP-DM (DAYQUIL MULTI-SYMPTOM PO)   Oral   Take 30 mLs by mouth every 6 (six) hours as needed (for cold).         . traZODone (DESYREL) 50 MG tablet   Oral   Take 50 mg by mouth at bedtime.         . influenza vac split quadrivalent PF (FLUARIX) 0.5 ML injection   Intramuscular   Inject 0.5 mLs into the muscle once.          BP 154/104  Pulse 115  Temp(Src) 98.7 F (37.1 C) (Oral)  Resp 18  SpO2 100% Physical Exam  Nursing note and vitals reviewed. Constitutional: She is oriented to person, place, and time. She appears well-developed and well-nourished. No distress.  HENT:  Head: Normocephalic and atraumatic.  Eyes: Conjunctivae and  EOM are normal.  Neck: Normal range of motion.  Cardiovascular: Normal rate and regular rhythm.  Exam reveals no gallop and no friction rub.   No murmur heard. Pulmonary/Chest: Effort normal and breath sounds normal. She has no wheezes. She has no rales. She exhibits no tenderness.  Abdominal: Soft. She exhibits no distension. There is no tenderness. There is no rebound and no guarding.  Musculoskeletal: Normal range of motion.  Bilateral paraspinal lumbar tenderness to palpation. No midline spine tenderness.   Neurological: She is alert and oriented to person, place, and time. Coordination normal.  Speech is goal-oriented. Moves limbs without ataxia.   Skin: Skin is warm and dry.  Psychiatric: She has a normal mood and affect. Her behavior is normal.    ED Course  Procedures  (including critical care time) Labs Review Labs Reviewed  COMPREHENSIVE METABOLIC PANEL - Abnormal; Notable for the following:    Sodium 134 (*)    GFR calc non Af Amer 86 (*)    All other components within normal limits  URINALYSIS, ROUTINE W REFLEX MICROSCOPIC - Abnormal; Notable for the following:    Bilirubin Urine SMALL (*)    Ketones, ur >80 (*)    Protein, ur 30 (*)    All other components within normal limits  CBC WITH DIFFERENTIAL  URINE MICROSCOPIC-ADD ON  POCT PREGNANCY, URINE   Imaging Review No results found.  EKG Interpretation   None       MDM   1. Back pain     7:11 PM Urinalysis and labs unremarkable. Vitals stable and patient afebrile. Patient will have morphine and zofran for symptoms.   8:36 PM Patient given IV fluids. Patient will be discharged with pain and nausea medication. Vitals stable and patient afebrile.   Emilia Beck, PA-C 11/17/13 2039

## 2013-11-20 NOTE — ED Provider Notes (Signed)
Medical screening examination/treatment/procedure(s) were performed by non-physician practitioner and as supervising physician I was immediately available for consultation/collaboration.  EKG Interpretation   None         Audree Camel, MD 11/20/13 1511

## 2015-07-10 ENCOUNTER — Emergency Department (HOSPITAL_COMMUNITY): Payer: Medicaid Other

## 2015-07-10 ENCOUNTER — Encounter (HOSPITAL_COMMUNITY): Payer: Self-pay | Admitting: Neurology

## 2015-07-10 ENCOUNTER — Emergency Department (HOSPITAL_COMMUNITY)
Admission: EM | Admit: 2015-07-10 | Discharge: 2015-07-10 | Disposition: A | Discharge status: Home / Self Care | Payer: Self-pay | Attending: Emergency Medicine | Admitting: Emergency Medicine

## 2015-07-10 ENCOUNTER — Emergency Department (INDEPENDENT_AMBULATORY_CARE_PROVIDER_SITE_OTHER)
Admission: EM | Admit: 2015-07-10 | Discharge: 2015-07-10 | Disposition: A | Discharge status: Nursing Home (Includes ICF) | Payer: Self-pay | Source: Home / Self Care | Attending: Family Medicine | Admitting: Family Medicine

## 2015-07-10 DIAGNOSIS — Z87440 Personal history of urinary (tract) infections: Secondary | ICD-10-CM | POA: Insufficient documentation

## 2015-07-10 DIAGNOSIS — R21 Rash and other nonspecific skin eruption: Secondary | ICD-10-CM | POA: Insufficient documentation

## 2015-07-10 DIAGNOSIS — Z720 Tobacco use: Secondary | ICD-10-CM | POA: Insufficient documentation

## 2015-07-10 DIAGNOSIS — Z3202 Encounter for pregnancy test, result negative: Secondary | ICD-10-CM | POA: Insufficient documentation

## 2015-07-10 DIAGNOSIS — I10 Essential (primary) hypertension: Secondary | ICD-10-CM | POA: Insufficient documentation

## 2015-07-10 DIAGNOSIS — R519 Headache, unspecified: Secondary | ICD-10-CM

## 2015-07-10 DIAGNOSIS — R51 Headache: Secondary | ICD-10-CM | POA: Insufficient documentation

## 2015-07-10 DIAGNOSIS — Z7982 Long term (current) use of aspirin: Secondary | ICD-10-CM | POA: Insufficient documentation

## 2015-07-10 LAB — POCT URINALYSIS DIP (DEVICE)
BILIRUBIN URINE: NEGATIVE
Glucose, UA: NEGATIVE mg/dL
Hgb urine dipstick: NEGATIVE
Ketones, ur: NEGATIVE mg/dL
LEUKOCYTES UA: NEGATIVE
Nitrite: NEGATIVE
Protein, ur: 30 mg/dL — AB
Specific Gravity, Urine: 1.015 (ref 1.005–1.030)
Urobilinogen, UA: 1 mg/dL (ref 0.0–1.0)
pH: 7.5 (ref 5.0–8.0)

## 2015-07-10 LAB — CBC
HCT: 39.2 % (ref 36.0–46.0)
Hemoglobin: 13 g/dL (ref 12.0–15.0)
MCH: 32.3 pg (ref 26.0–34.0)
MCHC: 33.2 g/dL (ref 30.0–36.0)
MCV: 97.5 fL (ref 78.0–100.0)
Platelets: 197 10*3/uL (ref 150–400)
RBC: 4.02 MIL/uL (ref 3.87–5.11)
RDW: 13.3 % (ref 11.5–15.5)
WBC: 7.9 10*3/uL (ref 4.0–10.5)

## 2015-07-10 LAB — BASIC METABOLIC PANEL
Anion gap: 7 (ref 5–15)
BUN: 8 mg/dL (ref 6–20)
CHLORIDE: 106 mmol/L (ref 101–111)
CO2: 23 mmol/L (ref 22–32)
CREATININE: 0.69 mg/dL (ref 0.44–1.00)
Calcium: 9 mg/dL (ref 8.9–10.3)
GFR calc Af Amer: >60 mL/min (ref >60)
Glucose, Bld: 95 mg/dL (ref 65–99)
Potassium: 3.6 mmol/L (ref 3.5–5.1)
Sodium: 136 mmol/L (ref 135–145)

## 2015-07-10 LAB — SEDIMENTATION RATE: SED RATE: 18 mm/h (ref 0–22)

## 2015-07-10 LAB — I-STAT BETA HCG BLOOD, ED (MC, WL, AP ONLY): I-stat hCG, quantitative: <5 m[IU]/mL (ref <5)

## 2015-07-10 LAB — POCT PREGNANCY, URINE: Preg Test, Ur: NEGATIVE

## 2015-07-10 LAB — I-STAT TROPONIN, ED: Troponin i, poc: 0 ng/mL (ref 0.00–0.08)

## 2015-07-10 MED ORDER — METOCLOPRAMIDE HCL 5 MG/ML IJ SOLN
10.0000 mg | INTRAMUSCULAR | Status: AC
  Administered 2015-07-10: 10 mg via INTRAVENOUS
  Filled 2015-07-10: qty 2

## 2015-07-10 MED ORDER — DIPHENHYDRAMINE HCL 50 MG/ML IJ SOLN
25.0000 mg | Freq: Once | INTRAMUSCULAR | Status: AC
  Administered 2015-07-10: 25 mg via INTRAVENOUS
  Filled 2015-07-10: qty 1

## 2015-07-10 MED ORDER — TETRACAINE HCL 0.5 % OP SOLN
1.0000 [drp] | Freq: Once | OPHTHALMIC | Status: AC
  Administered 2015-07-10: 1 [drp] via OPHTHALMIC
  Filled 2015-07-10: qty 2

## 2015-07-10 MED ORDER — PREDNISONE 20 MG PO TABS: 40.0000 mg | ORAL_TABLET | Freq: Every day | ORAL | Status: DC

## 2015-07-10 MED ORDER — ENALAPRILAT 1.25 MG/ML IV SOLN
1.2500 mg | Freq: Once | INTRAVENOUS | Status: AC
  Administered 2015-07-10: 1.25 mg via INTRAVENOUS
  Filled 2015-07-10: qty 1

## 2015-07-10 MED ORDER — METOPROLOL TARTRATE 25 MG PO TABS: 25.0000 mg | ORAL_TABLET | Freq: Two times a day (BID) | ORAL | Status: DC

## 2015-07-10 NOTE — ED Notes (Signed)
Headache, eyes hurting , sees floaters, blurry vision.  Onset approx 2 hours ago.  Reports nausea, no vomiting.  C/o lightheadedness.  C/o low back pain, chronic.

## 2015-07-10 NOTE — Discharge Instructions (Signed)
Take the prescribed medication as directed. Follow-up with your primary care physician-- lab work and CT scan report on back for physician review. Return to the ED for new or worsening symptoms.

## 2015-07-10 NOTE — ED Provider Notes (Signed)
CSN: [id number]     Arrival date & time 07/10/15  1612 History   First MD Initiated Contact with Patient 07/10/15 1739     Chief Complaint  Patient presents with  . Headache   (Consider location/radiation/quality/duration/timing/severity/associated sxs/prior Treatment) Patient is a 39 y.o. female presenting with eye problem. The history is provided by the patient.  Eye Problem Location:  L eye Severity:  Mild Onset quality:  Sudden Duration:  4 hours Progression:  Worsening Chronicity:  New Context comment:  Sx of scotomas and floaters in vision after awakening this pm. Relieved by:  None tried Associated symptoms: headaches, nausea, redness, scotomas and weakness   Associated symptoms: no itching and no photophobia   Associated symptoms comment:  Left eye scleritis laterally.   Past Medical History  Diagnosis Date  . Hypertension   . Kidney infection    Past Surgical History  Procedure Laterality Date  . Dilation and curettage of uterus    . Foot surgery     No family history on file. History  Substance Use Topics  . Smoking status: Current Every Day Smoker -- 0.25 packs/day for 15 years    Types: Cigarettes  . Smokeless tobacco: Never Used  . Alcohol Use: Yes   OB History    No data available     Review of Systems  Eyes: Positive for redness and visual disturbance. Negative for photophobia and itching.  Respiratory: Negative.   Gastrointestinal: Positive for nausea.  Skin: Positive for rash.  Neurological: Positive for weakness and headaches.    Allergies  Review of patient's allergies indicates no known allergies.  Home Medications   Prior to Admission medications   Medication Sig Start Date End Date Taking? Authorizing Provider  aspirin-acetaminophen-caffeine (EXCEDRIN MIGRAINE) [phone] MG per tablet Take 2 tablets by mouth every 6 (six) hours as needed for headache.   Yes Historical Provider, MD  traZODone (DESYREL) 50 MG tablet Take 50 mg by mouth  at bedtime.   Yes Historical Provider, MD  DM-Doxylamine-Acetaminophen (NYQUIL COLD & FLU PO) Take 30 mLs by mouth at bedtime as needed (for cold).    Historical Provider, MD  influenza vac split quadrivalent PF (FLUARIX) 0.5 ML injection Inject 0.5 mLs into the muscle once.    Historical Provider, MD  iron polysaccharides (NIFEREX) 150 MG capsule Take 150 mg by mouth 3 (three) times a week. Take 1 tablet on Sun, Tues and Thurs    Historical Provider, MD  ondansetron (ZOFRAN ODT) 4 MG disintegrating tablet Take 1 tablet (4 mg total) by mouth every 8 (eight) hours as needed for nausea or vomiting. 11/17/13   Kaitlyn Szekalski, PA-C  ondansetron (ZOFRAN-ODT) 4 MG disintegrating tablet Take 4 mg by mouth every 8 (eight) hours as needed for nausea or vomiting.    Historical Provider, MD  oxyCODONE-acetaminophen (PERCOCET/ROXICET) 5-325 MG per tablet Take 2 tablets by mouth every 4 (four) hours as needed for severe pain. Patient not taking: Reported on 07/10/2015 11/17/13   Emilia Beck, PA-C  Pseudoephedrine-APAP-DM (DAYQUIL MULTI-SYMPTOM PO) Take 30 mLs by mouth every 6 (six) hours as needed (for cold).    Historical Provider, MD   BP 177/111 mmHg  Pulse 74  Temp(Src) 98.1 F (36.7 C) (Oral)  Resp 16  SpO2 100%  LMP 06/10/2015 Physical Exam  Constitutional: She is oriented to person, place, and time. She appears well-developed and well-nourished. She appears distressed.  Eyes: EOM are normal. Pupils are equal, round, and reactive to light. Left conjunctiva is injected.  Neck: Normal range of motion. Neck supple.  Cardiovascular: Normal heart sounds.   Pulmonary/Chest: Effort normal and breath sounds normal.  Abdominal: Soft. Bowel sounds are normal.  Lymphadenopathy:    She has no cervical adenopathy.  Neurological: She is alert and oriented to person, place, and time.  Skin: Skin is warm and dry. Rash noted.  Nursing note and vitals reviewed.   ED Course  Procedures (including  critical care time) Labs Review Labs Reviewed  POCT URINALYSIS DIP (DEVICE) - Abnormal; Notable for the following:    Protein, ur 30 (*)    All other components within normal limits  POCT PREGNANCY, URINE    Imaging Review No results found.   MDM   1. Essential hypertension    Sent for eval of hbp, scleritis, visual changes, weakness, rash, fam h/o SLE, possibly related to acute lupus flare.    Linna HoffJames D Kindl, MD 07/10/15 1800

## 2015-07-10 NOTE — ED Provider Notes (Signed)
CSN: [id number]     Arrival date & time 07/10/15  1809 History   First MD Initiated Contact with Patient 07/10/15 2029     Chief Complaint  Patient presents with  . Rash  . Hypertension     (Consider location/radiation/quality/duration/timing/severity/associated sxs/prior Treatment) Patient is a 39 y.o. female presenting with rash and hypertension. The history is provided by the patient and medical records.  Rash Associated symptoms: headaches   Hypertension Associated symptoms include headaches and a rash.   This is a 39 y.o. F with hx of HTN, recurrent UTI's, presenting to the ED for multiple symptoms.  Patient states she has developed a rash over the past several days of her upper arms, chest, posterior neck, and back.  States begins as bright red patches and will eventually turn a dark tan color.  States when she gets hot, areas do tend to itch.  States otherwise, no itching.  No changes in soaps, detergents, or other personal care products.  No new medications.  Patient also reports headache and floaters in her left visual field today.  No hx of same. No visual loss. No head injuries or ocular trauma.  States her left eye begin appearing red after she woke up from a  Nap.  Headache generalized, starts in her forehead and radiates outward.  No focal numbness, weakness, or paresthesias.  No gait disturbance, or changes in speech.  Patient was seen at urgent care, sent here for further evaluation. There was concern for lupus flare. Patient has no known history of lupus, however very strong family history of this. Her BP is elevated on arrival. She does have history of hypertension, but has not taken medications in several years (was taking lisinopril).    Past Medical History  Diagnosis Date  . Hypertension   . Kidney infection    Past Surgical History  Procedure Laterality Date  . Dilation and curettage of uterus    . Foot surgery     No family history on file. History  Substance  Use Topics  . Smoking status: Current Every Day Smoker -- 0.25 packs/day for 15 years    Types: Cigarettes  . Smokeless tobacco: Never Used  . Alcohol Use: Yes   OB History    No data available     Review of Systems  Skin: Positive for rash.  Neurological: Positive for headaches.  All other systems reviewed and are negative.     Allergies  Review of patient's allergies indicates no known allergies.  Home Medications   Prior to Admission medications   Medication Sig Start Date End Date Taking? Authorizing Provider  aspirin-acetaminophen-caffeine (EXCEDRIN MIGRAINE) [phone] MG per tablet Take 2 tablets by mouth every 6 (six) hours as needed for headache.   Yes Historical Provider, MD  iron polysaccharides (NIFEREX) 150 MG capsule Take 150 mg by mouth 3 (three) times a week. Take 1 tablet on Sun, Tues and Thurs   Yes Historical Provider, MD  traZODone (DESYREL) 50 MG tablet Take 50 mg by mouth at bedtime.   Yes Historical Provider, MD   BP 189/114 mmHg  Pulse 67  Temp(Src) 98.3 F (36.8 C) (Oral)  Resp 17  Ht 5\' 6"  (1.676 m)  Wt 170 lb 9.6 oz (77.384 kg)  BMI 27.55 kg/m2  SpO2 100%  LMP 06/10/2015   Physical Exam  Constitutional: She is oriented to person, place, and time. She appears well-developed and well-nourished.  HENT:  Head: Normocephalic and atraumatic.  Mouth/Throat: Oropharynx is clear  and moist.  No oral lesions or airway compromise, handling secretions well  Eyes: Conjunctivae and EOM are normal. Pupils are equal, round, and reactive to light.  No lid edema or erythema; right eye normal in appearance; left conjunctiva injected along lateral aspect without visible FB; no hemorrhage; IOP 12 in left eye  Neck: Normal range of motion and full passive range of motion without pain. No rigidity.  No rigidity, full range of motion  Cardiovascular: Normal rate, regular rhythm and normal heart sounds.   Pulmonary/Chest: Effort normal and breath sounds normal.   Abdominal: Soft. Bowel sounds are normal.  Musculoskeletal: Normal range of motion.  Neurological: She is alert and oriented to person, place, and time.  AAOx3, answering questions and following commands appropriately; equal strength UE and LE bilaterally; CN grossly intact; moves all extremities appropriately without ataxia; no focal neuro deficits or facial asymmetry appreciated  Skin: Skin is warm and dry. Rash noted.  Non-specific rash across BUE; posterior neck; back, and chest; intermixed red and tan plaques without noted vesicles or pustules; no lesions on palms or soles; no signs of superimposed infection or cellulitis  Psychiatric: She has a normal mood and affect.  Nursing note and vitals reviewed.   ED Course  Procedures (including critical care time) Labs Review Labs Reviewed  BASIC METABOLIC PANEL  CBC  SEDIMENTATION RATE  URINALYSIS, ROUTINE W REFLEX MICROSCOPIC (NOT AT [facility])  I-STAT TROPOININ, ED  I-STAT BETA HCG BLOOD, ED ([facility], [facility], [facility] ONLY)    Imaging Review Ct Head Wo Contrast  07/10/2015   CLINICAL DATA:  Headache, blurry vision  EXAM: CT HEAD WITHOUT CONTRAST  TECHNIQUE: Contiguous axial images were obtained from the base of the skull through the vertex without intravenous contrast.  COMPARISON:  None.  FINDINGS: No skull fracture is noted. There is mucosal thickening bilateral maxillary sinus. The mastoid air cells are unremarkable. No intracranial hemorrhage, mass effect or midline shift. No acute cortical infarction. No mass lesion is noted on this unenhanced scan. No hydrocephalus. The gray and white-matter differentiation is preserved.  IMPRESSION: No acute intracranial abnormality. Mucosal thickening bilateral maxillary sinus.   Electronically Signed   By: Natasha Mead M.D.   On: 07/10/2015 22:43     EKG Interpretation   Date/Time:  Tuesday July 10 2015 18:41:53 EDT Ventricular Rate:  73 PR Interval:  164 QRS Duration: 84 QT Interval:  418 QTC Calculation:  460 R Axis:   85 Text Interpretation:  Normal sinus rhythm Normal ECG No old tracing to  compare Confirmed by MILLER  MD, BRIAN ([id number]) on 07/10/2015 8:56:37 PM      MDM   Final diagnoses:  Headache  Rash  Essential hypertension   39 year old female sent in from urgent care for further evaluation of possible lupus flare. She has no diagnosis of lupus, strong family history. Patient is hypertensive on arrival, has been off of lisinopril for the past several years. She has a headache which begins in her forehead and radiate outward. Her neurologic exam is nonfocal. No clinical signs of meningitis. She also complains of floaters in her left eye. Left conjunctiva is slightly injected along lateral aspect without noted foreign body. No head or ocular trauma. Lastly she has rash scattered across bilateral upper sternal reason trunk. No new changes in soaps or detergents, no new medications. Rash is intermixed red and tan colored plaques. No lesions on palms or soles. No oral lesions or airway compromise. No signs of superimposed infection or cellulitis.  Lab work pending as well as CT head. Will obtain pressures in left eye.  Patient given migraine cocktail and IV enalapril.  Labwork is overall reassuring, normal sedimentation rate. CT head negative for acute findings aside from mucosal thickening of maxillary sinuses bilaterally.  IOP in left eye WNL at 12.  After medications, headache much improved and BP now well WNL.  No signs of end organ damage currently.  Given negative workup here, feel patient is stable for outpatient management. She may need further workup for lupus in the future.  Her blood pressure was elevated on arrival, we'll restart medications. Patient states when she took lisinopril in the past he made her dizzy, will switch to metoprolol.  Short course of prednisone given for rash.  FU with PCP encouraged, copies of lab and imaging studies from ED visit given for physician review.   Discussed plan with patient, he/she acknowledged understanding and agreed with plan of care.  Return precautions given for new or worsening symptoms.  Case discussed with attending physician, Dr. Hyacinth Meeker, who evaluated patient and agrees with assessment and plan of care.  Garlon Hatchet, PA-C 07/10/15 8119  Eber Hong, MD 07/11/15 1455

## 2015-07-10 NOTE — ED Notes (Signed)
Pt reports blurry vision and seeing floaters 1530 also has small red dot rash to both arms and neck. Has hx of lupus. Feels nauseated and light headed. [facility]

## 2015-07-10 NOTE — ED Notes (Signed)
Pt reports she has NOT been diagnosed with Lupus, but her PCP just sent her here because she is having symptoms of a Lupus flare up. She reports blurry vision of left eye and seeing floaters.

## 2016-04-11 ENCOUNTER — Encounter (HOSPITAL_COMMUNITY): Payer: Self-pay | Admitting: Emergency Medicine

## 2016-04-11 ENCOUNTER — Emergency Department (HOSPITAL_COMMUNITY)
Admission: EM | Admit: 2016-04-11 | Discharge: 2016-04-11 | Disposition: A | Discharge status: Home / Self Care | Payer: Medicaid Other | Attending: Emergency Medicine | Admitting: Emergency Medicine

## 2016-04-11 ENCOUNTER — Emergency Department (HOSPITAL_COMMUNITY): Payer: Medicaid Other

## 2016-04-11 DIAGNOSIS — Z79899 Other long term (current) drug therapy: Secondary | ICD-10-CM | POA: Diagnosis not present

## 2016-04-11 DIAGNOSIS — W108XXA Fall (on) (from) other stairs and steps, initial encounter: Secondary | ICD-10-CM | POA: Insufficient documentation

## 2016-04-11 DIAGNOSIS — S82392A Other fracture of lower end of left tibia, initial encounter for closed fracture: Secondary | ICD-10-CM | POA: Insufficient documentation

## 2016-04-11 DIAGNOSIS — F17210 Nicotine dependence, cigarettes, uncomplicated: Secondary | ICD-10-CM | POA: Insufficient documentation

## 2016-04-11 DIAGNOSIS — S82892A Other fracture of left lower leg, initial encounter for closed fracture: Secondary | ICD-10-CM

## 2016-04-11 DIAGNOSIS — Z87448 Personal history of other diseases of urinary system: Secondary | ICD-10-CM | POA: Insufficient documentation

## 2016-04-11 DIAGNOSIS — S99912A Unspecified injury of left ankle, initial encounter: Secondary | ICD-10-CM | POA: Diagnosis present

## 2016-04-11 DIAGNOSIS — I10 Essential (primary) hypertension: Secondary | ICD-10-CM | POA: Insufficient documentation

## 2016-04-11 DIAGNOSIS — Y998 Other external cause status: Secondary | ICD-10-CM | POA: Diagnosis not present

## 2016-04-11 DIAGNOSIS — Y9389 Activity, other specified: Secondary | ICD-10-CM | POA: Diagnosis not present

## 2016-04-11 DIAGNOSIS — Y9289 Other specified places as the place of occurrence of the external cause: Secondary | ICD-10-CM | POA: Insufficient documentation

## 2016-04-11 DIAGNOSIS — W19XXXA Unspecified fall, initial encounter: Secondary | ICD-10-CM

## 2016-04-11 LAB — BASIC METABOLIC PANEL
Anion gap: 14 (ref 5–15)
BUN: 15 mg/dL (ref 6–20)
CO2: 20 mmol/L — AB (ref 22–32)
CREATININE: 0.69 mg/dL (ref 0.44–1.00)
Calcium: 9.1 mg/dL (ref 8.9–10.3)
Chloride: 112 mmol/L — ABNORMAL HIGH (ref 101–111)
GFR calc Af Amer: >60 mL/min (ref >60)
GFR calc non Af Amer: >60 mL/min (ref >60)
Glucose, Bld: 101 mg/dL — ABNORMAL HIGH (ref 65–99)
Potassium: 4.4 mmol/L (ref 3.5–5.1)
Sodium: 146 mmol/L — ABNORMAL HIGH (ref 135–145)

## 2016-04-11 LAB — CBC WITH DIFFERENTIAL/PLATELET
Basophils Absolute: 0 10*3/uL (ref 0.0–0.1)
Basophils Relative: 0 %
EOS ABS: 0 10*3/uL (ref 0.0–0.7)
EOS PCT: 0 %
HCT: 40.8 % (ref 36.0–46.0)
HEMOGLOBIN: 13.3 g/dL (ref 12.0–15.0)
LYMPHS ABS: 3.3 10*3/uL (ref 0.7–4.0)
Lymphocytes Relative: 34 %
MCH: 31.4 pg (ref 26.0–34.0)
MCHC: 32.6 g/dL (ref 30.0–36.0)
MCV: 96.2 fL (ref 78.0–100.0)
MONOS PCT: 6 %
Monocytes Absolute: 0.6 10*3/uL (ref 0.1–1.0)
Neutro Abs: 5.6 10*3/uL (ref 1.7–7.7)
Neutrophils Relative %: 60 %
PLATELETS: 290 10*3/uL (ref 150–400)
RBC: 4.24 MIL/uL (ref 3.87–5.11)
RDW: 12.6 % (ref 11.5–15.5)
WBC: 9.5 10*3/uL (ref 4.0–10.5)

## 2016-04-11 MED ORDER — PROPOFOL 10 MG/ML IV BOLUS
INTRAVENOUS | Status: AC | PRN
  Administered 2016-04-11: 50 mg via INTRAVENOUS

## 2016-04-11 MED ORDER — HYDROMORPHONE HCL 1 MG/ML IJ SOLN
0.5000 mg | Freq: Once | INTRAMUSCULAR | Status: AC
  Administered 2016-04-11: 0.5 mg via INTRAVENOUS
  Filled 2016-04-11: qty 1

## 2016-04-11 MED ORDER — OXYCODONE-ACETAMINOPHEN 5-325 MG PO TABS: 1.0000 | ORAL_TABLET | ORAL | Status: DC | PRN

## 2016-04-11 MED ORDER — ONDANSETRON HCL 4 MG/2ML IJ SOLN
4.0000 mg | Freq: Once | INTRAMUSCULAR | Status: AC
  Administered 2016-04-11: 4 mg via INTRAVENOUS
  Filled 2016-04-11: qty 2

## 2016-04-11 MED ORDER — PROPOFOL 10 MG/ML IV BOLUS
80.0000 mg | Freq: Once | INTRAVENOUS | Status: DC
  Filled 2016-04-11: qty 20

## 2016-04-11 MED ORDER — FREE SPIRIT KNEE/LEG WALKER MISC
1.0000 | Freq: Once | Status: DC
Start: 2016-04-11 — End: 2020-07-16

## 2016-04-11 MED ORDER — HYDROMORPHONE HCL 1 MG/ML IJ SOLN
1.0000 mg | Freq: Once | INTRAMUSCULAR | Status: AC
  Administered 2016-04-11: 1 mg via INTRAVENOUS
  Filled 2016-04-11: qty 1

## 2016-04-11 NOTE — ED Notes (Signed)
All interventions done by Student Nurse Michael Peters completed in direct supervision of Sheena Donegan RN 

## 2016-04-11 NOTE — ED Provider Notes (Signed)
CSN: [id number]     Arrival date & time 04/11/16  0919 History   First MD Initiated Contact with Patient 04/11/16 [phone]     Chief Complaint  Patient presents with  . Fall     (Consider location/radiation/quality/duration/timing/severity/associated sxs/prior Treatment) Patient is a 40 y.o. female presenting with ankle pain. The history is provided by the patient.  Ankle Pain Location:  Ankle Time since incident:  30 minutes Injury: yes   Mechanism of injury: fall   Fall:    Fall occurred:  Down stairs   Height of fall:  4 feet   Impact surface:  Dirt   Point of impact:  Feet   Entrapped after fall: no   Ankle location:  L ankle Pain details:    Quality:  Aching   Radiates to:  Does not radiate   Severity:  Severe   Onset quality:  Sudden   Timing:  Constant   Progression:  Unchanged Chronicity:  New Dislocation: yes   Foreign body present:  No foreign bodies Prior injury to area:  No Relieved by:  Nothing Worsened by:  Nothing tried Ineffective treatments:  None tried Associated symptoms: decreased ROM (2/2 pain and deformity) and swelling   Associated symptoms: no numbness     Past Medical History  Diagnosis Date  . Hypertension   . Kidney infection    Past Surgical History  Procedure Laterality Date  . Dilation and curettage of uterus    . Foot surgery     No family history on file. Social History  Substance Use Topics  . Smoking status: Current Every Day Smoker -- 0.25 packs/day for 15 years    Types: Cigarettes  . Smokeless tobacco: Never Used  . Alcohol Use: Yes   OB History    No data available     Review of Systems  All other systems reviewed and are negative.     Allergies  Review of patient's allergies indicates no known allergies.  Home Medications   Prior to Admission medications   Medication Sig Start Date End Date Taking? Authorizing Provider  aspirin-acetaminophen-caffeine (EXCEDRIN MIGRAINE) [phone] [id number] MG per tablet Take 2  tablets by mouth every 6 (six) hours as needed for headache.    Historical Provider, MD  iron polysaccharides (NIFEREX) 150 MG capsule Take 150 mg by mouth 3 (three) times a week. Take 1 tablet on Sun, Tues and Thurs    Historical Provider, MD  metoprolol (LOPRESSOR) 25 MG tablet Take 1 tablet (25 mg total) by mouth 2 (two) times daily. 07/10/15   Garlon HatchetLisa M Sanders, PA-C  predniSONE (DELTASONE) 20 MG tablet Take 2 tablets (40 mg total) by mouth daily. Take 40 mg by mouth daily for 3 days, then 20mg  by mouth daily for 3 days, then 10mg  daily for 3 days 07/10/15   Garlon HatchetLisa M Sanders, PA-C  traZODone (DESYREL) 50 MG tablet Take 50 mg by mouth at bedtime.    Historical Provider, MD   BP 128/85 mmHg  Pulse 91  Temp(Src) 99.2 F (37.3 C) (Oral)  Resp 16  SpO2 96% Physical Exam  Constitutional: She is oriented to person, place, and time. She appears well-developed and well-nourished. No distress.  HENT:  Head: Normocephalic.  Eyes: Conjunctivae are normal.  Neck: Neck supple. No tracheal deviation present.  Cardiovascular: Normal rate and regular rhythm.   Pulmonary/Chest: Effort normal. No respiratory distress.  Abdominal: Soft. She exhibits no distension.  Musculoskeletal:       Left ankle: She exhibits swelling,  ecchymosis and deformity (posterior dislocation). She exhibits normal pulse. Tenderness. Lateral malleolus and medial malleolus tenderness found. Achilles tendon normal.  Neurological: She is alert and oriented to person, place, and time.  Skin: Skin is warm and dry.  Psychiatric: She has a normal mood and affect.    ED Course  Procedures (including critical care time) Procedural sedation Performed by: Lyndal Pulley Consent: Verbal consent obtained. Risks and benefits: risks, benefits and alternatives were discussed Required items: required blood products, implants, devices, and special equipment available Patient identity confirmed: arm band and provided demographic data Time out:  Immediately prior to procedure a "time out" was called to verify the correct patient, procedure, equipment, support staff and site/side marked as required.  Sedation type: moderate (conscious) sedation NPO time confirmed and considedered  Sedatives: PROPOFOL  Physician Time at Bedside: 20 minutes  Vitals: Vital signs were monitored during sedation. Cardiac Monitor, pulse oximeter Patient tolerance: Patient tolerated the procedure well with no immediate complications. Comments: Pt with uneventful recovered. Returned to pre-procedural sedation baseline   Reduction of dislocation Date/Time: 8:51 PM Performed by: Lyndal Pulley Authorized by: Lyndal Pulley Consent: Verbal consent obtained. Risks and benefits: risks, benefits and alternatives were discussed Consent given by: patient Required items: required blood products, implants, devices, and special equipment available Time out: Immediately prior to procedure a "time out" was called to verify the correct patient, procedure, equipment, support staff and site/side marked as required.  Patient sedated: as above  Vitals: Vital signs were monitored during sedation. Patient tolerance: Patient tolerated the procedure well with no immediate complications. Joint: left ankle Reduction technique: traction, countertraction   SPLINT APPLICATION Date/Time: 8:52 PM Authorized by: Lyndal Pulley Consent: Verbal consent obtained. Risks and benefits: risks, benefits and alternatives were discussed Consent given by: patient Splint applied by: orthopedic technician Location details: left ankle Splint type: short leg with stirrup Supplies used: ortho glass Post-procedure: The splinted body part was neurovascularly unchanged following the procedure. Patient tolerance: Patient tolerated the procedure well with no immediate complications.     Labs Review Labs Reviewed  CBC WITH DIFFERENTIAL/PLATELET  BASIC METABOLIC PANEL    Imaging  Review Dg Tibia/fibula Left  04/11/2016  CLINICAL DATA:  Fall down stairs. Severe pain and swelling about the ankle with deformity. EXAM: LEFT TIBIA AND FIBULA - 2 VIEW COMPARISON:  Ankle radiographs of the same day. FINDINGS: There is a posterior fracture dislocation of the tibiotalar joint. A distal fibular fracture is also present. The more proximal tibia and fibula are intact. The knee is unremarkable. No ankle joint effusion is present. Extensive soft tissue swelling is present. The more proximal soft tissues are within normal limits. IMPRESSION: 1. Posterior fracture dislocation of the tibiotalar joint with a joint effusion and extensive soft tissue swelling. 2. Distal fibular fracture. 3. The proximal tibia and fibula are within normal limits. Electronically Signed   By: Marin Roberts M.D.   On: 04/11/2016 10:03   Dg Ankle Complete Left  04/11/2016  CLINICAL DATA:  Fall down stairs. Fell backwards. Severe pain and swelling about the ankle. EXAM: LEFT ANKLE COMPLETE - 3+ VIEW COMPARISON:  None. FINDINGS: A complex comminuted posterior tibiotalar fracture dislocation is noted. There is a comminuted posterior medial tibial fracture. There is dorsal angulation of the distal fibular fracture as well. The talar dome appears to be intact. A moderate-sized joint effusion is present. Bimalleolar soft tissue swelling is evident. IMPRESSION: 1. Comminuted posterior distal tibial fracture with posterior dislocation of the tibiotalar joint. The  fracture involves less than 1/4 of the articular surface of the distal tibia. 2. Dorsal angulation of distal fibular fracture. 3. Moderate-sized joint effusion. Electronically Signed   By: Marin Roberts M.D.   On: 04/11/2016 10:14   Dg Ankle Left Port  04/11/2016  CLINICAL DATA:  Status post reduction EXAM: PORTABLE LEFT ANKLE - 2 VIEW COMPARISON:  Films from earlier in the same day FINDINGS: Casting material is now noted. The distal tibial and fibular  fractures have been reduced and the dislocation of the tibiotalar joint is been reduced as well. The fracture fragments are in near anatomic alignment. IMPRESSION: Reduction of previously seen fracture dislocation. Electronically Signed   By: Alcide Clever M.D.   On: 04/11/2016 11:38   I have personally reviewed and evaluated these images and lab results as part of my medical decision-making.   EKG Interpretation None      MDM   Final diagnoses:  Fall  Closed left ankle fracture, initial encounter    40 y.o. female presents with fall from porch after rail broke with obvious deformity to left ankle. NVI distally. Tib/fib fractured distally with dislocation. Reduced as above without complications. Discussed with Dr Ophelia Charter who will see in follow up. Difficulty ambulating with crutches, will give Rx for scooter to help with ambulation. Provided analgesia for home. Plan to follow up with PCP as needed and return precautions discussed for worsening or new concerning symptoms.     Lyndal Pulley, MD 04/12/16 [phone]

## 2016-04-11 NOTE — ED Notes (Signed)
Ice Pack applied to pts ankle.

## 2016-04-11 NOTE — Progress Notes (Signed)
Orthopedic Tech Progress Note Patient Details:  Kaylee Simpson 02/16/1976 [id number]  Ortho Devices Type of Ortho Device: Ace wrap, Post (short leg) splint, Stirrup splint Ortho Device/Splint Interventions: Application   Saul FordyceJennifer C Renner Sebald 04/11/2016, 11:50 AM

## 2016-04-11 NOTE — ED Notes (Signed)
Attempted to educate pt on crutches. Pt is very unsteady and at high risk for falls. MD informed and is talking to case management about getting a scooter.

## 2016-04-11 NOTE — Discharge Instructions (Signed)
Cast or Splint Care  Casts and splints support injured limbs and keep bones from moving while they heal. It is important to care for your cast or splint at home.    HOME CARE INSTRUCTIONS  · Keep the cast or splint uncovered during the drying period. It can take 24 to 48 hours to dry if it is made of plaster. A fiberglass cast will dry in less than 1 hour.  · Do not rest the cast on anything harder than a pillow for the first 24 hours.  · Do not put weight on your injured limb or apply pressure to the cast until your health care provider gives you permission.  · Keep the cast or splint dry. Wet casts or splints can lose their shape and may not support the limb as well. A wet cast that has lost its shape can also create harmful pressure on your skin when it dries. Also, wet skin can become infected.    Cover the cast or splint with a plastic bag when bathing or when out in the rain or snow. If the cast is on the trunk of the body, take sponge baths until the cast is removed.    If your cast does become wet, dry it with a towel or a blow dryer on the cool setting only.  · Keep your cast or splint clean. Soiled casts may be wiped with a moistened cloth.  · Do not place any hard or soft foreign objects under your cast or splint, such as cotton, toilet paper, lotion, or powder.  · Do not try to scratch the skin under the cast with any object. The object could get stuck inside the cast. Also, scratching could lead to an infection. If itching is a problem, use a blow dryer on a cool setting to relieve discomfort.  · Do not trim or cut your cast or remove padding from inside of it.  · Exercise all joints next to the injury that are not immobilized by the cast or splint. For example, if you have a long leg cast, exercise the hip joint and toes. If you have an arm cast or splint, exercise the shoulder, elbow, thumb, and fingers.  · Elevate your injured arm or leg on 1 or 2 pillows for the first 1 to 3 days to decrease  swelling and pain. It is best if you can comfortably elevate your cast so it is higher than your heart.  SEEK MEDICAL CARE IF:   · Your cast or splint cracks.  · Your cast or splint is too tight or too loose.  · You have unbearable itching inside the cast.  · Your cast becomes wet or develops a soft spot or area.  · You have a bad smell coming from inside your cast.  · You get an object stuck under your cast.  · Your skin around the cast becomes red or raw.  · You have new pain or worsening pain after the cast has been applied.  SEEK IMMEDIATE MEDICAL CARE IF:   · You have fluid leaking through the cast.  · You are unable to move your fingers or toes.  · You have discolored (blue or white), cool, painful, or very swollen fingers or toes beyond the cast.  · You have tingling or numbness around the injured area.  · You have severe pain or pressure under the cast.  · You have any difficulty with your breathing or have shortness of breath.  · You   have chest pain.     This information is not intended to replace advice given to you by your health care provider. Make sure you discuss any questions you have with your health care provider.     Document Released: 12/12/2000 Document Revised: 10/05/2013 Document Reviewed: 06/23/2013  Elsevier Interactive Patient Education ©2016 [employer]  Ankle Fracture  A fracture is a break in a bone. The ankle joint is made up of three bones. These include the lower (distal) sections of your lower leg bones, called the tibia and fibula, along with a bone in your foot, called the talus. Depending on how bad the break is and if more than one ankle joint bone is broken, a cast or splint is used to protect and keep your injured bone from moving while it heals. Sometimes, surgery is required to help the fracture heal properly.   There are two general types of fractures:  · Stable fracture. This includes a single fracture line through one bone, with no injury to ankle ligaments. A fracture of  the talus that does not have any displacement (movement of the bone on either side of the fracture line) is also stable.  · Unstable fracture. This includes more than one fracture line through one or more bones in the ankle joint. It also includes fractures that have displacement of the bone on either side of the fracture line.  CAUSES  · A direct blow to the ankle.    · Quickly and severely twisting your ankle.  · Trauma, such as a car accident or falling from a significant height.  RISK FACTORS  You may be at a higher risk of ankle fracture if:  · You have certain medical conditions.  · You are involved in high-impact sports.  · You are involved in a high-impact car accident.  SIGNS AND SYMPTOMS   · Tender and swollen ankle.  · Bruising around the injured ankle.  · Pain on movement of the ankle.  · Difficulty walking or putting weight on the ankle.  · A cold foot below the site of the ankle injury. This can occur if the blood vessels passing through your injured ankle were also damaged.  · Numbness in the foot below the site of the ankle injury.  DIAGNOSIS   An ankle fracture is usually diagnosed with a physical exam and X-rays. A CT scan may also be required for complex fractures.  TREATMENT   Stable fractures are treated with a cast or splint and using crutches to avoid putting weight on your injured ankle. This is followed by an ankle strengthening program. Some patients require a special type of cast, depending on other medical problems they may have. Unstable fractures require surgery to ensure the bones heal properly. Your health care provider will tell you what type of fracture you have and the best treatment for your condition.  HOME CARE INSTRUCTIONS   · Review correct crutch use with your health care provider and use your crutches as directed. Safe use of crutches is extremely important. Misuse of crutches can cause you to fall or cause injury to nerves in your hands or armpits.  · Do not put weight or  pressure on the injured ankle until directed by your health care provider.  · To lessen the swelling, keep the injured leg elevated while sitting or lying down.  · Apply ice to the injured area:    Put ice in a plastic bag.    Place a towel between your   cast and the bag.    Leave the ice on for 20 minutes, 2-3 times a day.  · If you have a plaster or fiberglass cast:    Do not try to scratch the skin under the cast with any objects. This can increase your risk of skin infection.    Check the skin around the cast every day. You may put lotion on any red or sore areas.    Keep your cast dry and clean.  · If you have a plaster splint:    Wear the splint as directed.    You may loosen the elastic around the splint if your toes become numb, tingle, or turn cold or blue.  · Do not put pressure on any part of your cast or splint; it may break. Rest your cast only on a pillow the first 24 hours until it is fully hardened.  · Your cast or splint can be protected during bathing with a plastic bag sealed to your skin with medical tape. Do not lower the cast or splint into water.  · Take medicines as directed by your health care provider. Only take over-the-counter or prescription medicines for pain, discomfort, or fever as directed by your health care provider.  · Do not drive a vehicle until your health care provider specifically tells you it is safe to do so.  · If your health care provider has given you a follow-up appointment, it is very important to keep that appointment. Not keeping the appointment could result in a chronic or permanent injury, pain, and disability. If you have any problem keeping the appointment, call the facility for assistance.  SEEK MEDICAL CARE IF:  You develop increased swelling or discomfort.  SEEK IMMEDIATE MEDICAL CARE IF:   · Your cast gets damaged or breaks.  · You have continued severe pain.  · You develop new pain or swelling after the cast was put on.  · Your skin or toenails below the  injury turn blue or gray.  · Your skin or toenails below the injury feel cold, numb, or have loss of sensitivity to touch.  · There is a bad smell or pus draining from under the cast.  MAKE SURE YOU:   · Understand these instructions.  · Will watch your condition.  · Will get help right away if you are not doing well or get worse.     This information is not intended to replace advice given to you by your health care provider. Make sure you discuss any questions you have with your health care provider.     Document Released: 12/12/2000 Document Revised: 12/20/2013 Document Reviewed: 07/14/2013  Elsevier Interactive Patient Education ©2016 [employer]

## 2016-04-11 NOTE — ED Notes (Signed)
Pt reports that she fell off of her steps this morning about 3 feet landing on her left ankle. Pt has obvious deformity to left ankle. Pt alert x4.

## 2016-04-15 ENCOUNTER — Encounter (HOSPITAL_COMMUNITY): Payer: Self-pay | Admitting: *Deleted

## 2016-04-15 NOTE — Progress Notes (Signed)
Ms Kaylee Simpson reported that Dr Ophelia Charteryates instructed her nothing to eat after midnight, but that she could have clear liquids.  I asked patient when did he tell her to stop drinking liquids,  "he didn't, I guess when I get there."  I instructed patient that she should not have clear liquids after 0700 and if she did that surgery could be postponed.  Patient correctly listed for me examples of clear liquids.

## 2016-04-16 ENCOUNTER — Encounter (HOSPITAL_COMMUNITY): Payer: Self-pay | Admitting: *Deleted

## 2016-04-16 ENCOUNTER — Encounter (HOSPITAL_COMMUNITY)
Admission: RE | Disposition: A | Discharge status: Home / Self Care | Payer: Self-pay | Source: Ambulatory Visit | Attending: Orthopaedic Surgery

## 2016-04-16 ENCOUNTER — Observation Stay (HOSPITAL_COMMUNITY)
Admission: RE | Admit: 2016-04-16 | Discharge: 2016-04-18 | Disposition: A | Discharge status: Home / Self Care | Payer: Medicaid Other | Source: Ambulatory Visit | Attending: Orthopaedic Surgery | Admitting: Orthopaedic Surgery

## 2016-04-16 ENCOUNTER — Ambulatory Visit (HOSPITAL_COMMUNITY): Payer: Medicaid Other | Admitting: Anesthesiology

## 2016-04-16 DIAGNOSIS — S93432A Sprain of tibiofibular ligament of left ankle, initial encounter: Secondary | ICD-10-CM | POA: Insufficient documentation

## 2016-04-16 DIAGNOSIS — W19XXXA Unspecified fall, initial encounter: Secondary | ICD-10-CM

## 2016-04-16 DIAGNOSIS — I10 Essential (primary) hypertension: Secondary | ICD-10-CM | POA: Insufficient documentation

## 2016-04-16 DIAGNOSIS — Y92512 Supermarket, store or market as the place of occurrence of the external cause: Secondary | ICD-10-CM | POA: Diagnosis not present

## 2016-04-16 DIAGNOSIS — S82842A Displaced bimalleolar fracture of left lower leg, initial encounter for closed fracture: Principal | ICD-10-CM | POA: Insufficient documentation

## 2016-04-16 DIAGNOSIS — F17210 Nicotine dependence, cigarettes, uncomplicated: Secondary | ICD-10-CM | POA: Insufficient documentation

## 2016-04-16 DIAGNOSIS — W109XXA Fall (on) (from) unspecified stairs and steps, initial encounter: Secondary | ICD-10-CM | POA: Insufficient documentation

## 2016-04-16 DIAGNOSIS — Z9889 Other specified postprocedural states: Secondary | ICD-10-CM

## 2016-04-16 DIAGNOSIS — Z8781 Personal history of (healed) traumatic fracture: Secondary | ICD-10-CM

## 2016-04-16 HISTORY — PX: ORIF ANKLE FRACTURE: SHX5408

## 2016-04-16 HISTORY — PX: ORIF ANKLE FRACTURE BIMALLEOLAR: SUR920

## 2016-04-16 HISTORY — DX: Adverse effect of unspecified anesthetic, initial encounter: T41.45XA

## 2016-04-16 HISTORY — DX: Tubulo-interstitial nephritis, not specified as acute or chronic: N12

## 2016-04-16 HISTORY — DX: Other specified postprocedural states: R11.2

## 2016-04-16 HISTORY — DX: Other specified postprocedural states: Z98.890

## 2016-04-16 HISTORY — DX: Other complications of anesthesia, initial encounter: T88.59XA

## 2016-04-16 LAB — URINALYSIS, ROUTINE W REFLEX MICROSCOPIC
BILIRUBIN URINE: NEGATIVE
GLUCOSE, UA: NEGATIVE mg/dL
HGB URINE DIPSTICK: NEGATIVE
KETONES UR: NEGATIVE mg/dL
Leukocytes, UA: NEGATIVE
NITRITE: NEGATIVE
PH: 6 (ref 5.0–8.0)
Protein, ur: NEGATIVE mg/dL
Specific Gravity, Urine: 1.017 (ref 1.005–1.030)

## 2016-04-16 LAB — CBC
HEMATOCRIT: 39.6 % (ref 36.0–46.0)
Hemoglobin: 12.6 g/dL (ref 12.0–15.0)
MCH: 31.4 pg (ref 26.0–34.0)
MCHC: 31.8 g/dL (ref 30.0–36.0)
MCV: 98.8 fL (ref 78.0–100.0)
Platelets: 232 10*3/uL (ref 150–400)
RBC: 4.01 MIL/uL (ref 3.87–5.11)
RDW: 12.9 % (ref 11.5–15.5)
WBC: 6 10*3/uL (ref 4.0–10.5)

## 2016-04-16 LAB — SURGICAL PCR SCREEN
MRSA, PCR: NEGATIVE
STAPHYLOCOCCUS AUREUS: NEGATIVE

## 2016-04-16 LAB — COMPREHENSIVE METABOLIC PANEL
ALT: 68 U/L — ABNORMAL HIGH (ref 14–54)
ANION GAP: 10 (ref 5–15)
AST: 86 U/L — ABNORMAL HIGH (ref 15–41)
Albumin: 3.7 g/dL (ref 3.5–5.0)
Alkaline Phosphatase: 74 U/L (ref 38–126)
BILIRUBIN TOTAL: 0.6 mg/dL (ref 0.3–1.2)
BUN: 14 mg/dL (ref 6–20)
CALCIUM: 9.4 mg/dL (ref 8.9–10.3)
CO2: 21 mmol/L — ABNORMAL LOW (ref 22–32)
Chloride: 108 mmol/L (ref 101–111)
Creatinine, Ser: 0.53 mg/dL (ref 0.44–1.00)
GFR calc Af Amer: >60 mL/min (ref >60)
Glucose, Bld: 100 mg/dL — ABNORMAL HIGH (ref 65–99)
POTASSIUM: 4.3 mmol/L (ref 3.5–5.1)
Sodium: 139 mmol/L (ref 135–145)
TOTAL PROTEIN: 7.4 g/dL (ref 6.5–8.1)

## 2016-04-16 LAB — PROTIME-INR
INR: 0.93 (ref 0.00–1.49)
PROTHROMBIN TIME: 12.7 s (ref 11.6–15.2)

## 2016-04-16 LAB — HCG, SERUM, QUALITATIVE: Preg, Serum: NEGATIVE

## 2016-04-16 LAB — APTT: aPTT: 30 seconds (ref 24–37)

## 2016-04-16 SURGERY — OPEN REDUCTION INTERNAL FIXATION (ORIF) ANKLE FRACTURE
Anesthesia: General | Site: Ankle | Laterality: Left

## 2016-04-16 MED ORDER — DOCUSATE SODIUM 100 MG PO CAPS
100.0000 mg | ORAL_CAPSULE | Freq: Two times a day (BID) | ORAL | Status: DC
  Administered 2016-04-16 – 2016-04-18 (×4): 100 mg via ORAL
  Filled 2016-04-16 (×4): qty 1

## 2016-04-16 MED ORDER — PROPOFOL 10 MG/ML IV BOLUS
INTRAVENOUS | Status: AC
  Filled 2016-04-16: qty 20

## 2016-04-16 MED ORDER — HYDROMORPHONE HCL 1 MG/ML IJ SOLN
1.0000 mg | INTRAMUSCULAR | Status: DC | PRN
  Administered 2016-04-16 – 2016-04-17 (×3): 1 mg via INTRAVENOUS
  Filled 2016-04-16 (×3): qty 1

## 2016-04-16 MED ORDER — MUPIROCIN 2 % EX OINT
1.0000 "application " | TOPICAL_OINTMENT | Freq: Once | CUTANEOUS | Status: AC
  Administered 2016-04-16: 1 via TOPICAL

## 2016-04-16 MED ORDER — ONDANSETRON HCL 4 MG/2ML IJ SOLN
INTRAMUSCULAR | Status: DC | PRN
  Administered 2016-04-16: 4 mg via INTRAVENOUS

## 2016-04-16 MED ORDER — FENTANYL CITRATE (PF) 100 MCG/2ML IJ SOLN
100.0000 ug | Freq: Once | INTRAMUSCULAR | Status: AC
  Administered 2016-04-16: 100 ug via INTRAVENOUS

## 2016-04-16 MED ORDER — SODIUM CHLORIDE 0.45 % IV SOLN: INTRAVENOUS | Status: DC

## 2016-04-16 MED ORDER — ONDANSETRON HCL 4 MG/2ML IJ SOLN
INTRAMUSCULAR | Status: AC
  Filled 2016-04-16: qty 4

## 2016-04-16 MED ORDER — METOCLOPRAMIDE HCL 5 MG PO TABS
5.0000 mg | ORAL_TABLET | Freq: Three times a day (TID) | ORAL | Status: DC | PRN
  Administered 2016-04-17: 10 mg via ORAL
  Filled 2016-04-16: qty 2

## 2016-04-16 MED ORDER — DEXAMETHASONE SODIUM PHOSPHATE 4 MG/ML IJ SOLN
INTRAMUSCULAR | Status: DC | PRN
  Administered 2016-04-16: 10 mg via INTRAVENOUS

## 2016-04-16 MED ORDER — FENTANYL CITRATE (PF) 250 MCG/5ML IJ SOLN
INTRAMUSCULAR | Status: AC
  Filled 2016-04-16: qty 5

## 2016-04-16 MED ORDER — LIDOCAINE HCL (CARDIAC) 20 MG/ML IV SOLN
INTRAVENOUS | Status: DC | PRN
  Administered 2016-04-16: 100 mg via INTRAVENOUS

## 2016-04-16 MED ORDER — BUPIVACAINE HCL (PF) 0.25 % IJ SOLN
INTRAMUSCULAR | Status: DC | PRN
  Administered 2016-04-16: 10 mL

## 2016-04-16 MED ORDER — DEXAMETHASONE SODIUM PHOSPHATE 10 MG/ML IJ SOLN
INTRAMUSCULAR | Status: AC
  Filled 2016-04-16: qty 1

## 2016-04-16 MED ORDER — METHOCARBAMOL 500 MG PO TABS
500.0000 mg | ORAL_TABLET | Freq: Four times a day (QID) | ORAL | Status: DC | PRN
  Administered 2016-04-16 – 2016-04-18 (×6): 500 mg via ORAL
  Filled 2016-04-16 (×6): qty 1

## 2016-04-16 MED ORDER — CHLORHEXIDINE GLUCONATE 4 % EX LIQD: 60.0000 mL | Freq: Once | CUTANEOUS | Status: DC

## 2016-04-16 MED ORDER — OXYCODONE HCL 5 MG/5ML PO SOLN
5.0000 mg | Freq: Once | ORAL | Status: DC | PRN
Start: 2016-04-16 — End: 2016-04-16

## 2016-04-16 MED ORDER — PROPOFOL 10 MG/ML IV BOLUS
INTRAVENOUS | Status: DC | PRN
  Administered 2016-04-16: 200 mg via INTRAVENOUS

## 2016-04-16 MED ORDER — ACETAMINOPHEN 650 MG RE SUPP: 650.0000 mg | Freq: Four times a day (QID) | RECTAL | Status: DC | PRN

## 2016-04-16 MED ORDER — MIDAZOLAM HCL 2 MG/2ML IJ SOLN: 2.0000 mg | Freq: Once | INTRAMUSCULAR | Status: AC

## 2016-04-16 MED ORDER — 0.9 % SODIUM CHLORIDE (POUR BTL) OPTIME
TOPICAL | Status: DC | PRN
  Administered 2016-04-16: 1000 mL

## 2016-04-16 MED ORDER — CEFAZOLIN SODIUM-DEXTROSE 2-4 GM/100ML-% IV SOLN
2.0000 g | INTRAVENOUS | Status: AC
  Administered 2016-04-16: 2 g via INTRAVENOUS

## 2016-04-16 MED ORDER — HYDROMORPHONE HCL 1 MG/ML IJ SOLN: 0.2500 mg | INTRAMUSCULAR | Status: DC | PRN

## 2016-04-16 MED ORDER — ONDANSETRON HCL 4 MG/2ML IJ SOLN: 4.0000 mg | Freq: Once | INTRAMUSCULAR | Status: DC | PRN

## 2016-04-16 MED ORDER — BUPIVACAINE HCL (PF) 0.25 % IJ SOLN
INTRAMUSCULAR | Status: AC
  Filled 2016-04-16: qty 30

## 2016-04-16 MED ORDER — ONDANSETRON HCL 4 MG/2ML IJ SOLN
4.0000 mg | Freq: Four times a day (QID) | INTRAMUSCULAR | Status: DC | PRN
  Administered 2016-04-16 – 2016-04-17 (×3): 4 mg via INTRAVENOUS
  Filled 2016-04-16 (×3): qty 2

## 2016-04-16 MED ORDER — LACTATED RINGERS IV SOLN
INTRAVENOUS | Status: DC | PRN
  Administered 2016-04-16 (×2): via INTRAVENOUS

## 2016-04-16 MED ORDER — MUPIROCIN 2 % EX OINT
TOPICAL_OINTMENT | CUTANEOUS | Status: AC
  Administered 2016-04-16: 1 via TOPICAL
  Filled 2016-04-16: qty 22

## 2016-04-16 MED ORDER — OXYCODONE HCL 5 MG PO TABS: 5.0000 mg | ORAL_TABLET | Freq: Once | ORAL | Status: DC | PRN

## 2016-04-16 MED ORDER — FENTANYL CITRATE (PF) 100 MCG/2ML IJ SOLN
INTRAMUSCULAR | Status: AC
  Administered 2016-04-16: 100 ug via INTRAVENOUS
  Filled 2016-04-16: qty 2

## 2016-04-16 MED ORDER — ACETAMINOPHEN 325 MG PO TABS: 650.0000 mg | ORAL_TABLET | Freq: Four times a day (QID) | ORAL | Status: DC | PRN

## 2016-04-16 MED ORDER — CEFAZOLIN SODIUM-DEXTROSE 2-4 GM/100ML-% IV SOLN
INTRAVENOUS | Status: AC
  Filled 2016-04-16: qty 100

## 2016-04-16 MED ORDER — FENTANYL CITRATE (PF) 100 MCG/2ML IJ SOLN
INTRAMUSCULAR | Status: DC | PRN
  Administered 2016-04-16 (×4): 25 ug via INTRAVENOUS

## 2016-04-16 MED ORDER — CEFAZOLIN SODIUM 1-5 GM-% IV SOLN
1.0000 g | Freq: Three times a day (TID) | INTRAVENOUS | Status: AC
  Administered 2016-04-16 – 2016-04-17 (×3): 1 g via INTRAVENOUS
  Filled 2016-04-16 (×3): qty 50

## 2016-04-16 MED ORDER — ONDANSETRON HCL 4 MG PO TABS: 4.0000 mg | ORAL_TABLET | Freq: Four times a day (QID) | ORAL | Status: DC | PRN

## 2016-04-16 MED ORDER — OXYCODONE HCL 5 MG PO TABS
5.0000 mg | ORAL_TABLET | ORAL | Status: DC | PRN
  Administered 2016-04-17 (×2): 5 mg via ORAL
  Filled 2016-04-16 (×2): qty 1

## 2016-04-16 MED ORDER — METHOCARBAMOL 1000 MG/10ML IJ SOLN
500.0000 mg | Freq: Four times a day (QID) | INTRAVENOUS | Status: DC | PRN
  Filled 2016-04-16: qty 5

## 2016-04-16 MED ORDER — METOCLOPRAMIDE HCL 5 MG/ML IJ SOLN
5.0000 mg | Freq: Three times a day (TID) | INTRAMUSCULAR | Status: DC | PRN
  Administered 2016-04-17: 10 mg via INTRAVENOUS
  Filled 2016-04-16 (×2): qty 2

## 2016-04-16 MED ORDER — MIDAZOLAM HCL 2 MG/2ML IJ SOLN
INTRAMUSCULAR | Status: AC
  Administered 2016-04-16: 2 mg
  Filled 2016-04-16: qty 2

## 2016-04-16 SURGICAL SUPPLY — 64 items
BANDAGE ELASTIC 4 VELCRO ST LF (GAUZE/BANDAGES/DRESSINGS) ×3 IMPLANT
BANDAGE ELASTIC 6 VELCRO ST LF (GAUZE/BANDAGES/DRESSINGS) ×3 IMPLANT
BANDAGE ESMARK 6X9 LF (GAUZE/BANDAGES/DRESSINGS) ×1 IMPLANT
BIT DRILL 2.5X2.75 QC CALB (BIT) ×3 IMPLANT
BNDG ESMARK 6X9 LF (GAUZE/BANDAGES/DRESSINGS) ×3
COVER MAYO STAND STRL (DRAPES) ×3 IMPLANT
COVER SURGICAL LIGHT HANDLE (MISCELLANEOUS) ×3 IMPLANT
CUFF TOURNIQUET SINGLE 34IN LL (TOURNIQUET CUFF) ×3 IMPLANT
CUFF TOURNIQUET SINGLE 44IN (TOURNIQUET CUFF) IMPLANT
DRAPE C-ARM 42X72 X-RAY (DRAPES) IMPLANT
DRAPE INCISE IOBAN 66X45 STRL (DRAPES) ×3 IMPLANT
DRAPE OEC MINIVIEW 54X84 (DRAPES) ×3 IMPLANT
DRAPE PROXIMA HALF (DRAPES) ×3 IMPLANT
DRAPE U-SHAPE 47X51 STRL (DRAPES) ×3 IMPLANT
DRSG PAD ABDOMINAL 8X10 ST (GAUZE/BANDAGES/DRESSINGS) ×6 IMPLANT
DURAPREP 26ML APPLICATOR (WOUND CARE) ×3 IMPLANT
ELECT REM PT RETURN 9FT ADLT (ELECTROSURGICAL) ×3
ELECTRODE REM PT RTRN 9FT ADLT (ELECTROSURGICAL) ×1 IMPLANT
GAUZE SPONGE 4X4 12PLY STRL (GAUZE/BANDAGES/DRESSINGS) ×3 IMPLANT
GAUZE XEROFORM 5X9 LF (GAUZE/BANDAGES/DRESSINGS) ×3 IMPLANT
GLOVE BIOGEL PI IND STRL 8 (GLOVE) ×2 IMPLANT
GLOVE BIOGEL PI INDICATOR 8 (GLOVE) ×4
GLOVE ECLIPSE 6.0 STRL STRAW (GLOVE) ×3 IMPLANT
GLOVE ORTHO TXT STRL SZ7.5 (GLOVE) ×6 IMPLANT
GLOVE SURG SS PI 6.5 STRL IVOR (GLOVE) ×3 IMPLANT
GOWN STRL REUS W/ TWL LRG LVL3 (GOWN DISPOSABLE) ×1 IMPLANT
GOWN STRL REUS W/ TWL XL LVL3 (GOWN DISPOSABLE) ×1 IMPLANT
GOWN STRL REUS W/TWL 2XL LVL3 (GOWN DISPOSABLE) ×3 IMPLANT
GOWN STRL REUS W/TWL LRG LVL3 (GOWN DISPOSABLE) ×2
GOWN STRL REUS W/TWL XL LVL3 (GOWN DISPOSABLE) ×2
KIT BASIN OR (CUSTOM PROCEDURE TRAY) ×3 IMPLANT
KIT ROOM TURNOVER OR (KITS) ×3 IMPLANT
MANIFOLD NEPTUNE II (INSTRUMENTS) ×3 IMPLANT
NEEDLE HYPO 25GX1X1/2 BEV (NEEDLE) ×3 IMPLANT
NS IRRIG 1000ML POUR BTL (IV SOLUTION) ×3 IMPLANT
PACK ORTHO EXTREMITY (CUSTOM PROCEDURE TRAY) ×3 IMPLANT
PAD ARMBOARD 7.5X6 YLW CONV (MISCELLANEOUS) ×6 IMPLANT
PAD CAST 4YDX4 CTTN HI CHSV (CAST SUPPLIES) ×1 IMPLANT
PADDING CAST COTTON 4X4 STRL (CAST SUPPLIES) ×2
PADDING CAST COTTON 6X4 STRL (CAST SUPPLIES) ×6 IMPLANT
PLATE LOCK 7H 92 BILAT FIB (Plate) ×3 IMPLANT
SCREW CORTICAL 3.5MM  16MM (Screw) ×2 IMPLANT
SCREW CORTICAL 3.5MM  55MM (Screw) ×2 IMPLANT
SCREW CORTICAL 3.5MM 14MM (Screw) ×6 IMPLANT
SCREW CORTICAL 3.5MM 16MM (Screw) ×1 IMPLANT
SCREW CORTICAL 3.5MM 26MM (Screw) ×3 IMPLANT
SCREW CORTICAL 3.5MM 55MM (Screw) ×1 IMPLANT
SCREW LOCK CANC STAR 4X10 (Screw) ×3 IMPLANT
SCREW LOCK CORT STAR 3.5X12 (Screw) ×3 IMPLANT
SPONGE GAUZE 4X4 12PLY STER LF (GAUZE/BANDAGES/DRESSINGS) ×6 IMPLANT
SPONGE LAP 18X18 X RAY DECT (DISPOSABLE) ×3 IMPLANT
STAPLER VISISTAT 35W (STAPLE) ×3 IMPLANT
SUCTION FRAZIER HANDLE 10FR (MISCELLANEOUS) ×2
SUCTION TUBE FRAZIER 10FR DISP (MISCELLANEOUS) ×1 IMPLANT
SUT ETHILON 3 0 PS 1 (SUTURE) ×6 IMPLANT
SUT VIC AB 2-0 CT1 27 (SUTURE) ×8
SUT VIC AB 2-0 CT1 TAPERPNT 27 (SUTURE) ×4 IMPLANT
SYR CONTROL 10ML LL (SYRINGE) ×3 IMPLANT
TOWEL OR 17X24 6PK STRL BLUE (TOWEL DISPOSABLE) ×3 IMPLANT
TOWEL OR 17X26 10 PK STRL BLUE (TOWEL DISPOSABLE) ×3 IMPLANT
TUBE CONNECTING 12'X1/4 (SUCTIONS) ×1
TUBE CONNECTING 12X1/4 (SUCTIONS) ×2 IMPLANT
WATER STERILE IRR 1000ML POUR (IV SOLUTION) ×3 IMPLANT
YANKAUER SUCT BULB TIP NO VENT (SUCTIONS) ×3 IMPLANT

## 2016-04-16 NOTE — H&P (Signed)
Kaylee Simpson is an 40 y.o. female.   HISTORY OF PRESENT ILLNESS:  This is a 40 year old female coming from a grocery store going down some steps, the grocery bag caught causing her to fall.  She suffered a fracture dislocation left ankle with bimalleolar fracture.  Fracture of the fibula posterior malleolus which was completely dislocated.  It was reduced by the ER staff under conscious sedation.  She was placed in a padded splint which she is in.  She has had considerable pain.  She states she is out of the 15 tablets of Percocet that she had.  No previous injury to the ankle.  She is normally followed by [facility].   CURRENT MEDICATIONS:  No prescription pain medications from a doctor.   ALLERGIES:  None.   PAST MEDICAL/SURGICAL HISTORY:  Surgery on her toe in the past that healed uneventfully.   SOCIAL HISTORY:  The patient is unemployed, single, here with her fiance.  She smokes less than 1/2 pack per day.  Occasional drink.   FAMILY HISTORY:  Positive for diabetes.  Grandfather with prostate cancer, grandmother with type unknown.  She thinks it might have been colon cancer, not sure.   REVIEW OF SYSTEMS:  Positive for anxiety, depression, hypertension.     Past Medical History  Diagnosis Date  . Complication of anesthesia   . PONV (postoperative nausea and vomiting)   . Hypertension     not a problem  . Pyelonephritis     x 2    Past Surgical History  Procedure Laterality Date  . Dilation and curettage of uterus    . Foot surgery Right 2011    fracture toesgreat toe and middle    History reviewed. No pertinent family history. Social History:  reports that she has been smoking Cigarettes.  She has a 3.75 pack-year smoking history. She has never used smokeless tobacco. She reports that she drinks about 3.0 oz of alcohol per week. She reports that she does not use illicit drugs.  Allergies: No Known Allergies  No prescriptions prior to admission    No results  found for this or any previous visit (from the past 48 hour(s)). No results found.  Review of Systems  Constitutional: Negative.   HENT: Negative.   Eyes: Negative.   Respiratory: Negative.   Cardiovascular: Negative.   Gastrointestinal: Negative.   Genitourinary: Negative.   Musculoskeletal: Positive for joint pain.  Skin: Negative.   Neurological: Negative.   Psychiatric/Behavioral: Negative.     Last menstrual period 02/20/2016. Physical Exam  Constitutional: She is oriented to person, place, and time. No distress.  HENT:  Head: Atraumatic.  Eyes: EOM are normal.  Neck: Normal range of motion.  Cardiovascular: Normal rate.   Respiratory: No respiratory distress.  GI: She exhibits no distension.  Neurological: She is alert and oriented to person, place, and time.  Skin: Skin is warm and dry.  Psychiatric: She has a normal mood and affect.   PHYSICAL EXAMINATION:  The patient is 5 feet 6 inches, 172 pounds, alert and oriented, WD, WN, NAD.  Extraocular movements intact.  Lungs are clear.  Heart:  Regular rate and rhythm.  Injury to the lower extremity shows good hip range of motion.  Knees reach full extension.  Well-padded splint.  Sensation of her toes intact.  This was a closed fracture.   RADIOGRAPHS/TEST:  X-rays were reviewed which showed posterior malleolar fracture and fibular fracture with residual dorsal angulation of the fibula.  PLAN:  We discussed open reduction internal fixation bimalleolar ankle fracture.  She may require a syndesmotic screw.  We discussed potential for syndesmotic screw placement, ORIF, fibular plating, possible posterior malleolar fixation.  All questions answered.  She understands and request we proceed.  I gave her Percocet 40 tablets prescribed.  Plan is for overnight stay.  Prescription given for a walker since she had an additional falling episode using crutches.   Naida Sleight, PA-C 04/16/2016, 11:57 AM

## 2016-04-16 NOTE — Anesthesia Procedure Notes (Addendum)
Anesthesia Regional Block:  Popliteal block  Pre-Anesthetic Checklist: ,, timeout performed, Correct Patient, Correct Site, Correct Laterality, Correct Procedure, Correct Position, site marked, Risks and benefits discussed,  Surgical consent,  Pre-op evaluation,  At surgeon's request and post-op pain management  Laterality: Left  Prep: chloraprep       Needles:   Needle Type: Echogenic Stimulator Needle     Needle Length: 9cm 9 cm Needle Gauge: 21 and 21 G    Additional Needles:  Procedures: ultrasound guided (picture in chart) Popliteal block Narrative:  Start time: 04/16/2016 2:20 PM End time: 04/16/2016 2:25 PM Injection made incrementally with aspirations every 5 mL.  Performed by: Personally   Additional Notes: 30 cc 0.5% Marcaine with 1:200,000 epi injected easily   Procedure Name: LMA Insertion Date/Time: 04/16/2016 3:47 PM Performed by: Faustino CongressWHITE, Kenta Laster TENA Vibha Ferdig Pre-anesthesia Checklist: Patient identified, Emergency Drugs available, Suction available and Patient being monitored Patient Re-evaluated:Patient Re-evaluated prior to inductionOxygen Delivery Method: Circle System Utilized Preoxygenation: Pre-oxygenation with 100% oxygen Intubation Type: IV induction LMA: LMA inserted LMA Size: 4.0 Number of attempts: 1 Airway Equipment and Method: Bite block Placement Confirmation: positive ETCO2 Tube secured with: Tape Dental Injury: Teeth and Oropharynx as per pre-operative assessment

## 2016-04-16 NOTE — Transfer of Care (Signed)
Immediate Anesthesia Transfer of Care Note  Patient: Kaylee Simpson  Procedure(s) Performed: Procedure(s): OPEN REDUCTION INTERNAL FIXATION (ORIF) LEFT BIMALLEOLAR ANKLE FRACTURE WITH SYNDESMOSIS SCREW (Left)  Patient Location: PACU  Anesthesia Type:GA combined with regional for post-op pain  Level of Consciousness: awake, alert , oriented and patient cooperative  Airway & Oxygen Therapy: Patient Spontanous Breathing  Post-op Assessment: Report given to RN and Post -op Vital signs reviewed and stable  Post vital signs: Reviewed and stable  Last Vitals:  Filed Vitals:   04/16/16 1425 04/16/16 1430  BP:  145/90  Pulse: 88 88  Temp:    Resp: 13 19    Complications: No apparent anesthesia complications

## 2016-04-16 NOTE — Interval H&P Note (Signed)
History and Physical Interval Note:  04/16/2016 2:41 PM  Kaylee Simpson  has presented today for surgery, with the diagnosis of left bimalleolar ankle fracture  The various methods of treatment have been discussed with the patient and family. After consideration of risks, benefits and other options for treatment, the patient has consented to  Procedure(s): OPEN REDUCTION INTERNAL FIXATION (ORIF) LEFT BIMALLEOLAR ANKLE FRACTURE, POSSIBLE SYNDESMOSIS SCREW (Left) as a surgical intervention .  The patient's history has been reviewed, patient examined, no change in status, stable for surgery.  I have reviewed the patient's chart and labs.  Questions were answered to the patient's satisfaction.     Jonie Burdell C

## 2016-04-16 NOTE — Op Note (Signed)
NAMMable Fill:  Kaylee Simpson, Kaylee Simpson               ACCOUNT NO.:  [id number]  MEDICAL RECORD NO.:  [id number]  LOCATION:  [id number]                        FACILITY:  [facility]  PHYSICIAN:  Mark C. Ophelia CharterYates, M.D.    DATE OF BIRTH:  1976/01/03  DATE OF PROCEDURE:  04/16/2016 DATE OF DISCHARGE:                              OPERATIVE REPORT   PREOPERATIVE DIAGNOSIS:  Left ankle fracture dislocation with bimalleolar ankle fracture and syndesmosis disruption.  POSTOPERATIVE DIAGNOSIS:  Left ankle fracture dislocation with bimalleolar ankle fracture and syndesmosis disruption.  PROCEDURE:  ORIF of right bimalleolar ankle fracture.  ORIF of syndesmosis.  SURGEON:  Mark C. Ophelia CharterYates, M.D.  ASSISTANT:  Genene ChurnJames M. Barry Dieneswens, PA-C, medically necessary and present for the entire procedure.  COMPLICATIONS:  None.  BRIEF HISTORY:  This patient presented after slip with some groceries in her hand, the bag caught on the edge of the stairs and she fell forward suffering a fracture dislocation of her ankle with posterior malleolus and lateral malleolus injury.  She torn the medial deltoid ligament and had complete ankle dislocation which was reduced by the ER staff under conscious sedation.  DESCRIPTION OF PROCEDURE:  Ancef prophylaxis was given.  General anesthesia, proximal thigh tourniquet, prepping and draping, preoperative nerve block likely fibular nerve block of the common peroneal nerve at the proximal end of the fibula had been performed by Anesthesia staff to help with postoperative pain.  DuraPrep was used up to the tourniquet, extremity sheets and drapes.  Sterile glove over the toes.  Time-out procedure.  Leg was elevated, tourniquet inflated. Lateral incision was made.  The patient had an old tattoo laterally. Incision ran along the subcutaneous border of the fibula.  Subperiosteal dissection, reduction was performed.  Syndesmosis was disrupted, and the patient had a posterior ankle dislocation.  With the fibula  reduced, self-retaining clamp was placed and a composite Biomet plate was selected with locking screws distally in the fibula, combination of cancellous and unicortical screws.  Inter frag screw was placed to help hold the reduction from proximal anterior to distal posteriorly. Proximally, bicortical screws were placed, and then the final hole had a syndesmosis screw placed parallel to the joint, and this was bicortical and was a 55 mm screw.  The tip was palpable medial.  This gave anatomic reduction of the joint with an oblique view.  The posterior malleolus had 1 mm displacement above less than 4th of the joint.  Syndesmosis was closed, medial clear space was closed.  Irrigation with saline solution.  2-0 Vicryl in the subcutaneous tissue, skin with staple closure, short-leg splint, Marcaine infiltration, and transferred to recovery room.  Instrument count and needle count were correct.     Mark C. Ophelia CharterYates, M.D.     MCY/MEDQ  D:  04/16/2016  T:  04/16/2016  Job:  [id number]

## 2016-04-16 NOTE — Brief Op Note (Signed)
04/16/2016  5:03 PM  PATIENT:  Kaylee Simpson  40 y.o. female  PRE-OPERATIVE DIAGNOSIS:  left bimalleolar ankle fracture  POST-OPERATIVE DIAGNOSIS:  left bimalleolar ankle fracture  PROCEDURE:  Procedure(s): OPEN REDUCTION INTERNAL FIXATION (ORIF) LEFT BIMALLEOLAR ANKLE FRACTURE WITH SYNDESMOSIS SCREW (Left)  SURGEON:  Surgeon(s) and Role:    * Eldred MangesMark C Yates, MD - Primary  PHYSICIAN ASSISTANT: Macaulay Reicher m. Jae Bruck pa-c    ANESTHESIA:   general  EBL:  Total I/O In: 1000 [I.V.:1000] Out: 50 [Blood:50]  BLOOD ADMINISTERED:none  DRAINS: none   LOCAL MEDICATIONS USED:  MARCAINE     SPECIMEN:  No Specimen  DISPOSITION OF SPECIMEN:  N/A  COUNTS:  YES  TOURNIQUET:   Total Tourniquet Time Documented: Thigh (Left) - 25 minutes Total: Thigh (Left) - 25 minutes   DICTATION: .Reubin Milanragon Dictation  PLAN OF CARE: Admit for overnight observation  PATIENT DISPOSITION:  PACU - hemodynamically stable.

## 2016-04-16 NOTE — Progress Notes (Signed)
Pt arrived from PACU to unit at 1750; A&O x4; denies any pain d/t block to LLE. LLE incision dsg remains clean, dry and intact. Pt oriented to unit and room; fall/safety precaution and prevention education completed with pt; pt voices understanding and agrees to call RN by phone or use call light. Pt voided x1 upon arrival to unit in bedpan. Skin intact except for surgical incision dsg; VSS; pt in bed with call light within reach and family at bedside. Will closely monitor pt. Dionne BucyP. Amo Evvie Behrmann RN

## 2016-04-16 NOTE — Anesthesia Preprocedure Evaluation (Addendum)
Anesthesia Evaluation  Patient identified by MRN, date of birth, ID band Patient awake    Reviewed: Allergy & Precautions, NPO status , Patient's Chart, lab work & pertinent test results  Airway Mallampati: II  TM Distance: >3 FB Neck ROM: Full    Dental  (+) Teeth Intact, Edentulous Upper, Dental Advisory Given   Pulmonary Current Smoker,    breath sounds clear to auscultation       Cardiovascular hypertension,  Rhythm:Regular Rate:Normal     Neuro/Psych    GI/Hepatic   Endo/Other    Renal/GU      Musculoskeletal   Abdominal   Peds  Hematology   Anesthesia Other Findings   Reproductive/Obstetrics                            Anesthesia Physical Anesthesia Plan  ASA: II  Anesthesia Plan: General   Post-op Pain Management:    Induction: Intravenous  Airway Management Planned: LMA  Additional Equipment:   Intra-op Plan:   Post-operative Plan: Extubation in OR  Informed Consent: I have reviewed the patients History and Physical, chart, labs and discussed the procedure including the risks, benefits and alternatives for the proposed anesthesia with the patient or authorized representative who has indicated his/her understanding and acceptance.   Dental advisory given  Plan Discussed with: CRNA and Anesthesiologist  Anesthesia Plan Comments: (H/P Post-op N/V plan GA with LMA, scopolamine patch Plan GA with LMA and popliteal block  Kipp Broodavid Jackquelyn Sundberg)       Anesthesia Quick Evaluation

## 2016-04-16 NOTE — Anesthesia Postprocedure Evaluation (Signed)
Anesthesia Post Note  Patient: Office managerCorliss Olliff  Procedure(s) Performed: Procedure(s) (LRB): OPEN REDUCTION INTERNAL FIXATION (ORIF) LEFT BIMALLEOLAR ANKLE FRACTURE WITH SYNDESMOSIS SCREW (Left)  Patient location during evaluation: PACU Anesthesia Type: General and Regional Level of consciousness: awake and alert Pain management: pain level controlled Vital Signs Assessment: post-procedure vital signs reviewed and stable Respiratory status: spontaneous breathing, nonlabored ventilation, respiratory function stable and patient connected to nasal cannula oxygen Cardiovascular status: blood pressure returned to baseline and stable Postop Assessment: no signs of nausea or vomiting Anesthetic complications: no    Last Vitals:  Filed Vitals:   04/16/16 1655 04/16/16 1710  BP: 120/77 126/83  Pulse: 82 75  Temp: 36.6 C   Resp: 9 16    Last Pain:  Filed Vitals:   04/16/16 1719  PainSc: 3                  Cecile HearingStephen Edward Turk

## 2016-04-16 NOTE — Interval H&P Note (Signed)
History and Physical Interval Note:  04/16/2016 2:40 PM  Kaylee Simpson  has presented today for surgery, with the diagnosis of left bimalleolar ankle fracture  The various methods of treatment have been discussed with the patient and family. After consideration of risks, benefits and other options for treatment, the patient has consented to  Procedure(s): OPEN REDUCTION INTERNAL FIXATION (ORIF) LEFT BIMALLEOLAR ANKLE FRACTURE, POSSIBLE SYNDESMOSIS SCREW (Left) as a surgical intervention .  The patient's history has been reviewed, patient examined, no change in status, stable for surgery.  I have reviewed the patient's chart and labs.  Questions were answered to the patient's satisfaction.     Zac Torti C

## 2016-04-17 ENCOUNTER — Observation Stay (HOSPITAL_COMMUNITY): Payer: Medicaid Other

## 2016-04-17 ENCOUNTER — Encounter (HOSPITAL_COMMUNITY): Payer: Self-pay | Admitting: Orthopaedic Surgery

## 2016-04-17 DIAGNOSIS — S82842A Displaced bimalleolar fracture of left lower leg, initial encounter for closed fracture: Secondary | ICD-10-CM | POA: Diagnosis not present

## 2016-04-17 MED ORDER — OXYCODONE-ACETAMINOPHEN 5-325 MG PO TABS
1.0000 | ORAL_TABLET | ORAL | Status: DC | PRN
  Administered 2016-04-17 – 2016-04-18 (×6): 1 via ORAL
  Filled 2016-04-17 (×6): qty 1

## 2016-04-17 MED ORDER — ASPIRIN EC 325 MG PO TBEC: 325.0000 mg | DELAYED_RELEASE_TABLET | Freq: Every day | ORAL | Status: DC

## 2016-04-17 MED ORDER — HYDROMORPHONE HCL 1 MG/ML IJ SOLN
1.0000 mg | INTRAMUSCULAR | Status: DC | PRN
  Administered 2016-04-17: 1 mg via INTRAVENOUS
  Filled 2016-04-17: qty 1

## 2016-04-17 MED ORDER — METHOCARBAMOL 500 MG PO TABS: 500.0000 mg | ORAL_TABLET | Freq: Four times a day (QID) | ORAL | Status: DC | PRN

## 2016-04-17 MED ORDER — OXYCODONE HCL 5 MG PO TABS
5.0000 mg | ORAL_TABLET | ORAL | Status: DC | PRN
  Administered 2016-04-17 – 2016-04-18 (×5): 5 mg via ORAL
  Filled 2016-04-17 (×5): qty 1

## 2016-04-17 MED ORDER — OXYCODONE-ACETAMINOPHEN 5-325 MG PO TABS: 1.0000 | ORAL_TABLET | Freq: Four times a day (QID) | ORAL | Status: DC | PRN

## 2016-04-17 NOTE — Progress Notes (Signed)
Nursing called requesting a stronger narcotic as this patient's popliteal block has worn off. Dilaudid reordered But recommend trying to wean to po narcotics so she may Be discharged in the near future.

## 2016-04-17 NOTE — Evaluation (Signed)
Occupational Therapy Evaluation Patient Details Name: Kaylee Simpson MRN: [id number] DOB: January 26, 1976 Today's Date: 04/17/2016    History of Present Illness ORIF of right bimalleolar ankle fracture   Clinical Impression   This 40 yo female admitted and underwent above presents to acute OT with deficits below (see OT problem list) thus affecting her PLOF of independent before breaking leg on 04/11/2016. She will benefit from one more session of OT prior to D/C home to finalize equipment and continued education on basic ADLs    Follow Up Recommendations  No OT follow up    Equipment Recommendations  3 in 1 bedside comode;Tub/shower bench;Other (comment) (RW)       Precautions / Restrictions Precautions Precautions: Fall Restrictions Weight Bearing Restrictions: Yes LLE Weight Bearing: Non weight bearing      Mobility Bed Mobility Overal bed mobility: Modified Independent Bed Mobility: Supine to Sit     Supine to sit: Supervision;HOB elevated     General bed mobility comments: use of rail coming out on right side of bed, HOB up 35*  Transfers Overall transfer level: Needs assistance Equipment used: Rolling walker (2 wheeled) Transfers: Sit to/from Stand Sit to Stand: Min assist         General transfer comment: min A to rise and steady, verbal cues hand placement    Balance Overall balance assessment: Needs assistance (reliant upon BUE support in standing) Sitting-balance support: No upper extremity supported;Feet supported Sitting balance-Leahy Scale: Good     Standing balance support: Bilateral upper extremity supported Standing balance-Leahy Scale: Poor Standing balance comment: reliant on RW for support                            ADL Overall ADL's : Needs assistance/impaired Eating/Feeding: Independent;Sitting   Grooming: Set up;Sitting   Upper Body Bathing: Set up;Sitting   Lower Body Bathing: Moderate assistance (min A sit<>stand)    Upper Body Dressing : Set up;Sitting   Lower Body Dressing: Moderate assistance (min A sit<>stand)   Toilet Transfer: Minimal assistance;Ambulation;RW (bed>around to recliner)   Toileting- Clothing Manipulation and Hygiene: Minimal assistance (min A sit<>stand)                         Pertinent Vitals/Pain Pain Assessment: 0-10 Pain Score: 9  Pain Location: L ankle in dependent position Pain Descriptors / Indicators: Sharp Pain Intervention(s): Limited activity within patient's tolerance;Monitored during session;Premedicated before session;Repositioned;Relaxation     Hand Dominance Right   Extremity/Trunk Assessment Upper Extremity Assessment Upper Extremity Assessment: Overall WFL for tasks assessed   Lower Extremity Assessment Lower Extremity Assessment: LLE deficits/detail LLE Deficits / Details: trace wiggling of toes, 3/5 SLR, 3/5 knee extension, toes numb to light touch     Communication Communication Communication: No difficulties   Cognition Arousal/Alertness: Awake/alert Behavior During Therapy: WFL for tasks assessed/performed Overall Cognitive Status: Within Functional Limits for tasks assessed                                Home Living Family/patient expects to be discharged to:: Private residence Living Arrangements: Spouse/significant other Available Help at Discharge: Family;Available PRN/intermittently (however boyfriend says he can stay with her as much as she needs for now) Type of Home: House Home Access: Stairs to enter [employer] of Steps: 4 Entrance Stairs-Rails: None Home Layout: One level     Bathroom Shower/Tub:  Tub/shower unit;Curtain Shower/tub characteristics: Engineer, building servicesCurtain Bathroom Toilet: Standard     Home Equipment: Crutches (knee walker)          Prior Functioning/Environment Level of Independence: Needs assistance  Gait / Transfers Assistance Needed: since April 14th has had to use crutches (which  she fell with) or knee walker ADL's / Homemaking Assistance Needed: has needed A since April 14        OT Diagnosis: Generalized weakness;Acute pain   OT Problem List: Decreased strength;Decreased range of motion;Impaired balance (sitting and/or standing);Pain;Decreased knowledge of precautions;Decreased knowledge of use of DME or AE   OT Treatment/Interventions: Self-care/ADL training;Patient/family education;Balance training;DME and/or AE instruction    OT Goals(Current goals can be found in the care plan section) Acute Rehab OT Goals Patient Stated Goal: home maybe tomorrow, be able to dress self and walk farther OT Goal Formulation: With patient Time For Goal Achievement: 04/24/16 Potential to Achieve Goals: Good  OT Frequency: Min 2X/week           Co-evaluation PT/OT/SLP Co-Evaluation/Treatment: Yes Reason for Co-Treatment: For patient/therapist safety PT goals addressed during session: Mobility/safety with mobility;Balance;Proper use of DME OT goals addressed during session: ADL's and self-care;Strengthening/ROM      End of Session Equipment Utilized During Treatment: Gait belt;Rolling walker  Activity Tolerance: Patient tolerated treatment well Patient left: in chair;with call bell/phone within reach;with family/visitor present   Time: [id number] OT Time Calculation (min): 24 min Charges:  OT General Charges $OT Visit: 1 Procedure OT Evaluation $OT Eval Moderate Complexity: 1 Procedure G-Codes: OT G-codes **NOT FOR INPATIENT CLASS** Functional Assessment Tool Used: Clinical observation Functional Limitation: Self care Self Care Current Status ([id number](G8987): At least 60 percent but less than 80 percent impaired, limited or restricted Self Care Goal Status ([id number](G8988): At least 20 percent but less than 40 percent impaired, limited or restricted  Kaylee Simpson, Kaylee Simpson Eva [phone] 04/17/2016, 1:41 PM

## 2016-04-17 NOTE — Evaluation (Signed)
Physical Therapy Evaluation Patient Details Name: Kaylee Simpson MRN: [id number] DOB: 1976-03-08 Today's Date: 04/17/2016   History of Present Illness  ORIF of right bimalleolar ankle fracture  Clinical Impression  Pt admitted with above diagnosis. Pt currently with functional limitations due to the deficits listed below (see PT Problem List). Pt ambulated 15' with RW, distance limited by 9/10 L ankle pain. Good progress expected as pain diminishes.  Pt will benefit from skilled PT to increase their independence and safety with mobility to allow discharge to the venue listed below.       Follow Up Recommendations No PT follow up    Equipment Recommendations  Rolling walker with 5" wheels    Recommendations for Other Services       Precautions / Restrictions Precautions Precautions: Fall Restrictions Weight Bearing Restrictions: Yes LLE Weight Bearing: Non weight bearing      Mobility  Bed Mobility Overal bed mobility: Modified Independent Bed Mobility: Supine to Sit     Supine to sit: Supervision;HOB elevated     General bed mobility comments: use of rail coming out on right side of bed, HOB up 35*  Transfers Overall transfer level: Needs assistance Equipment used: Rolling walker (2 wheeled) Transfers: Sit to/from Stand Sit to Stand: Min assist         General transfer comment: min A to rise and steady, verbal cues hand placement  Ambulation/Gait Ambulation/Gait assistance: Min assist;Min guard Ambulation Distance (Feet): 15 Feet Assistive device: Rolling walker (2 wheeled) Gait Pattern/deviations: Step-to pattern;Decreased step length - right   Gait velocity interpretation: Below normal speed for age/gender General Gait Details: NWB LLE, distance limited by pain with LLE in dependent position, min A for balance  Stairs            Wheelchair Mobility    Modified Rankin (Stroke Patients Only)       Balance Overall balance assessment: Needs  assistance (reliant upon BUE support in standing) Sitting-balance support: No upper extremity supported;Feet supported Sitting balance-Leahy Scale: Good     Standing balance support: Bilateral upper extremity supported Standing balance-Leahy Scale: Poor Standing balance comment: reliant on RW for support                             Pertinent Vitals/Pain Pain Assessment: 0-10 Pain Score: 9  Pain Location: L ankle in dependent position Pain Descriptors / Indicators: Sharp Pain Intervention(s): Limited activity within patient's tolerance;Monitored during session;Premedicated before session;Repositioned;Relaxation    Home Living Family/patient expects to be discharged to:: Private residence Living Arrangements: Spouse/significant other Available Help at Discharge: Family;Available PRN/intermittently (however boyfriend says he can stay with her as much as she needs for now) Type of Home: House Home Access: Stairs to enter Entrance Stairs-Rails: None Entrance Stairs-Number of Steps: 4 Home Layout: One level Home Equipment: Crutches (knee walker)      Prior Function Level of Independence: Needs assistance   Gait / Transfers Assistance Needed: since April 14th has had to use crutches (which she fell with) or knee walker  ADL's / Homemaking Assistance Needed: has needed A since April 14        Hand Dominance   Dominant Hand: Right    Extremity/Trunk Assessment   Upper Extremity Assessment: Overall WFL for tasks assessed           Lower Extremity Assessment: LLE deficits/detail   LLE Deficits / Details: trace wiggling of toes, 3/5 SLR, 3/5 knee extension, toes numb  to light touch  Cervical / Trunk Assessment: Normal  Communication   Communication: No difficulties  Cognition Arousal/Alertness: Awake/alert Behavior During Therapy: WFL for tasks assessed/performed Overall Cognitive Status: Within Functional Limits for tasks assessed                       General Comments      Exercises        Assessment/Plan    PT Assessment Patient needs continued PT services  PT Diagnosis Difficulty walking;Acute pain   PT Problem List Decreased strength;Decreased activity tolerance;Decreased balance;Pain;Decreased knowledge of use of DME;Decreased mobility  PT Treatment Interventions Gait training;Stair training;Functional mobility training;Therapeutic activities;Patient/family education;DME instruction;Therapeutic exercise;Balance training   PT Goals (Current goals can be found in the Care Plan section) Acute Rehab PT Goals Patient Stated Goal: home maybe tomorrow, be able to dress self and walk farther PT Goal Formulation: With patient/family Time For Goal Achievement: 04/24/16 Potential to Achieve Goals: Good    Frequency Min 6X/week   Barriers to discharge        Co-evaluation PT/OT/SLP Co-Evaluation/Treatment: Yes Reason for Co-Treatment: For patient/therapist safety PT goals addressed during session: Mobility/safety with mobility;Balance;Proper use of DME OT goals addressed during session: ADL's and self-care;Strengthening/ROM       End of Session Equipment Utilized During Treatment: Gait belt Activity Tolerance: Patient limited by pain Patient left: in chair;with call bell/phone within reach;with family/visitor present Nurse Communication: Mobility status    Functional Assessment Tool Used: clinical judgement Functional Limitation: Mobility: Walking and moving around Mobility: Walking and Moving Around Current Status [phone](G8978): At least 40 percent but less than 60 percent impaired, limited or restricted Mobility: Walking and Moving Around Goal Status [phone](G8979): At least 1 percent but less than 20 percent impaired, limited or restricted    Time: [id number] PT Time Calculation (min) (ACUTE ONLY): 26 min   Charges:   PT Evaluation $PT Eval Low Complexity: 1 Procedure     PT G Codes:   PT G-Codes **NOT FOR INPATIENT  CLASS** Functional Assessment Tool Used: clinical judgement Functional Limitation: Mobility: Walking and moving around Mobility: Walking and Moving Around Current Status ([phone](G8978): At least 40 percent but less than 60 percent impaired, limited or restricted Mobility: Walking and Moving Around Goal Status [phone](G8979): At least 1 percent but less than 20 percent impaired, limited or restricted    Tamala SerUhlenberg, Consepcion Utt Kistler 04/17/2016, 1:41 PM [phone]

## 2016-04-17 NOTE — Care Management Note (Signed)
Case Management Note  Patient Details  Name: Mable FillCorliss Paxson MRN: [id number] Date of Birth: 01/13/1976  Subjective/Objective:           S/p ORIF of right ankle         Action/Plan: PT/OT recommended rolling walker, 3N1 and tub bench. Spoke with patient then contacted [facility] and requested equipment be delivered to patient's room. No therapy needs identified. Patient stated that she will have assistance except for a few hours/day.     Expected Discharge Date:                  Expected Discharge Plan:  Home/Self Care  In-House Referral:  NA  Discharge planning Services  CM Consult  Post Acute Care Choice:  Durable Medical Equipment Choice offered to:     DME Arranged:  3-N-1, Tub bench, Walker rolling DME Agency:  [facility]  HH Arranged:  NA HH Agency:     Status of Service:  Completed, signed off  Medicare Important Message Given:    Date Medicare IM Given:    Medicare IM give by:    Date Additional Medicare IM Given:    Additional Medicare Important Message give by:     If discussed at Long Length of Stay Meetings, dates discussed:    Additional Comments:  Monica BectonKrieg, Chemika Night[place]ale Watson, RN 04/17/2016, 2:20 PM

## 2016-04-17 NOTE — Progress Notes (Signed)
PT Cancellation Note  Patient Details Name: Mable FillCorliss Schexnayder MRN: [id number] DOB: 04/09/1976   Cancelled Treatment:    Reason Eval/Treat Not Completed: Medical issues which prohibited therapy (PT on hold until R knee xrays read. Will follow. )   Tamala SerUhlenberg, Tarun Patchell Kistler 04/17/2016, 10:03 AM

## 2016-04-17 NOTE — Progress Notes (Signed)
Subjective: C/o pain left ankle and numbness proximal tibia to foot.  "cannot move my toes".  Also c/o right knee pain from her fall.  No xrays in ED.    Objective: Vital signs in last 24 hours: Temp:  [97.8 F (36.6 C)-98.2 F (36.8 C)] 98 F (36.7 C) (04/20 0517) Pulse Rate:  [70-96] 79 (04/20 0517) Resp:  [9-19] 16 (04/20 0517) BP: (120-146)/(77-106) 134/77 mmHg (04/20 0517) SpO2:  [97 %-100 %] 97 % (04/20 0517) Weight:  [77.52 kg (170 lb 14.4 oz)] 77.52 kg (170 lb 14.4 oz) (04/19 1337)  Intake/Output from previous day: 04/19 0701 - 04/20 0700 In: 1000 [I.V.:1000] Out: 600 [Urine:550; Blood:50] Intake/Output this shift:     Recent Labs  04/16/16 1315  HGB 12.6    Recent Labs  04/16/16 1315  WBC 6.0  RBC 4.01  HCT 39.6  PLT 232    Recent Labs  04/16/16 1315  NA 139  K 4.3  CL 108  CO2 21*  BUN 14  CREATININE 0.53  GLUCOSE 100*  CALCIUM 9.4    Recent Labs  04/16/16 1315  INR 0.93    Exam:  Alert and oriented.  No active movement of toes left foot.  Decreased sensation to light touch.  I did remove ace bandages and check her splint and this was not tight.  Right knee she has some bruising and abrasion around med prox tibia.  Proximal tibia is mod-marked TTP.  Joint line tender.  Pain with varus/valgus stress.  Difficult to assess cruciate and collateral ligament stability due to pain.    Assessment/Plan: Patient had a popliteal block left LE for postop pain management.  RN will monitor motor function.  Will get xray right knee due to pain there from her fall.  Will hold physical therapy until xray complete.     Kaylee Simpson M 04/17/2016, 8:30 AM

## 2016-04-17 NOTE — Clinical Social Work Note (Signed)
CSW received referral for SNF.  Case discussed with case manager, and plan is to discharge home with home health.  CSW to sign off please re-consult if social work needs arise.  Bobbyjoe Pabst R. Teodor Prater, MSW, LCSWA [phone]  

## 2016-04-18 DIAGNOSIS — S82842A Displaced bimalleolar fracture of left lower leg, initial encounter for closed fracture: Secondary | ICD-10-CM | POA: Diagnosis not present

## 2016-04-18 NOTE — Progress Notes (Signed)
Occupational Therapy Treatment Patient Details Name: Kaylee Simpson MRN: [id number] DOB: 22-Aug-1976 Today's Date: 04/18/2016    History of present illness ORIF of right bimalleolar ankle fracture   OT comments  Completed all education regarding ADl and tub transfers with use of DME. Pt/boyfriend able to return demonstrate. AE issued as appropriate. Pt safe to D/C home.  Follow Up Recommendations  No OT follow up    Equipment Recommendations  3 in 1 bedside comode;Tub/shower bench;Other (comment)    Recommendations for Other Services      Precautions / Restrictions Precautions Precautions: Fall Restrictions Weight Bearing Restrictions: Yes LLE Weight Bearing: Non weight bearing       Mobility Bed Mobility   General bed mobility comments: Pt recieved in recliner chair.    Transfers Overall transfer level: Needs assistance Equipment used: Rolling walker (2 wheeled) Transfers: Sit to/from Stand Sit to Stand: Supervision         General transfer comment: Pt performed with improved safety and better carryover pushing from seated surface.      Balance Overall balance assessment: Needs assistance   Sitting balance-Leahy Scale: Good       Standing balance-Leahy Scale: Fair                     ADL Overall ADL's : Needs assistance/impaired             Lower Body Bathing: Minimal assistance;Sitting/lateral leans       Lower Body Dressing: Minimal assistance;Sitting/lateral leans;Sit to/from stand           Tub/ Shower Transfer: Supervision/safety;Tub bench;Ambulation;Rolling walker   Functional mobility during ADLs: Supervision/safety;Rolling walker General ADL Comments: Completed education with pt/boyfriend regarding use of tub bench for tub trasnfer. Pt/boyfriend able to return demonstrate. Also educated pt on use of AE for LB ADL amd home set up to maximize independence at home and reduce risk of falls.       Vision                      Perception     Praxis      Cognition   Behavior During Therapy: WFL for tasks assessed/performed Overall Cognitive Status: Within Functional Limits for tasks assessed                                               General Comments      Pertinent Vitals/ Pain       Pain Assessment: 0-10 Pain Score: 4  Pain Location: L ankle in dependent position.   Pain Descriptors / Indicators: Aching Pain Intervention(s): Monitored during session;Repositioned  Home Living                                          Prior Functioning/Environment              Frequency Min 2X/week     Progress Toward Goals  OT Goals(current goals can now be found in the care plan section)  Progress towards OT goals: Goals met/education completed, patient discharged from OT  Acute Rehab OT Goals Patient Stated Goal: home now OT Goal Formulation: With patient Time For Goal Achievement: 04/24/16 Potential to Achieve Goals: Good ADL Goals Pt Will Perform Lower  Body Dressing: with set-up;with supervision;sit to/from stand;with adaptive equipment Pt Will Transfer to Toilet: with supervision;ambulating;bedside commode Pt Will Perform Toileting - Clothing Manipulation and hygiene: with supervision;sit to/from stand Pt Will Perform Tub/Shower Transfer: with supervision;ambulating;rolling walker;tub bench  Plan Discharge plan remains appropriate    Co-evaluation                 End of Session Equipment Utilized During Treatment: Gait belt;Rolling walker   Activity Tolerance Patient tolerated treatment well   Patient Left in chair;with call bell/phone within reach;with family/visitor present   Nurse Communication Mobility status        Time: 7561-2548 OT Time Calculation (min): 26 min  Charges: OT General Charges $OT Visit: 1 Procedure OT Treatments $Self Care/Home Management : 23-37 mins  Jull Harral,HILLARY 04/18/2016, 1:42 PM   [facility] [place], OTR/L   [phone] 04/18/2016

## 2016-04-18 NOTE — Progress Notes (Signed)
Pt ready for discharge. Education/instructions reviewed and all questions/cocnerns addressed. IV removed and belongings gathered. Pt will be transported out via wheelchair to boyfriend's car. Will continue to monior

## 2016-04-18 NOTE — Progress Notes (Signed)
Physical Therapy Treatment Patient Details Name: Marshea Wisher MRN: [id number] DOB: 07-11-1976 Today's Date: 04/18/2016    History of Present Illness ORIF of right bimalleolar ankle fracture    PT Comments    Pt performed review and demonstration of HEP for continued strengthening at home.    Follow Up Recommendations  No PT follow up     Equipment Recommendations  Rolling walker with 5" wheels    Recommendations for Other Services       Precautions / Restrictions Precautions Precautions: Fall Restrictions Weight Bearing Restrictions: Yes LLE Weight Bearing: Non weight bearing    Mobility  Bed Mobility Overal bed mobility: Modified Independent Bed Mobility: Supine to Sit     Supine to sit: Modified independent (Device/Increase time)     General bed mobility comments: Pt recieved in recliner chair.    Transfers Overall transfer level: Needs assistance Equipment used: Rolling walker (2 wheeled) Transfers: Sit to/from Stand Sit to Stand: Supervision         General transfer comment: Pt performed with improved safety and better carryover pushing from seated surface.    Ambulation/Gait Ambulation/Gait assistance:  (Gt deferred educated pt on home HEP during session.  ) Ambulation Distance (Feet): 120 Feet (6 ft x2 with RW for short gait distances to and from stairs.  ) Assistive device: Crutches;Rolling walker (2 wheeled)     Gait velocity interpretation: Below normal speed for age/gender General Gait Details: NWB LLE, distance limited by pain with LLE in dependent position, min guard A for balance   Stairs Stairs: Yes Stairs assistance: Min assist Stair Management: No rails;With walker;With crutches Number of Stairs: 8 (x4 with RW and x4 with crutches.) General stair comments: Pt required cues for RW placement and crutch placement.  Cues for sequencing.  Pt reports she feels confident to teach this technique to her boyfriend to assist her into the house.     Wheelchair Mobility    Modified Rankin (Stroke Patients Only)       Balance Overall balance assessment: Needs assistance   Sitting balance-Leahy Scale: Good       Standing balance-Leahy Scale: Fair                      Cognition Arousal/Alertness: Awake/alert Behavior During Therapy: WFL for tasks assessed/performed Overall Cognitive Status: Within Functional Limits for tasks assessed                      Exercises Total Joint Exercises Standing Hip Extension: AROM;Left;5 reps;Standing General Exercises - Lower Extremity Ankle Circles/Pumps: AROM;Right;5 reps;Supine Quad Sets: AROM;Both;5 reps;Supine Gluteal Sets: AROM;Both;5 reps;Supine Short Arc Quad: AROM;Both;5 reps;Supine Heel Slides: AROM;Both;5 reps;Supine Hip ABduction/ADduction: AROM;Both;5 reps;Supine;Standing;Left (supine 1x5 both LEs, standing 1x5 LLE. ) Hip Flexion/Marching: Standing;Left;AROM;5 reps Toe Raises: 5 reps Heel Raises: AROM;Standing;Right;5 reps Mini-Sqauts: AROM;Right;5 reps    General Comments        Pertinent Vitals/Pain Pain Assessment: 0-10 Pain Score: 4  Pain Location: L ankle in dependent position.   Pain Intervention(s): Monitored during session;Repositioned    Home Living                      Prior Function            PT Goals (current goals can now be found in the care plan section) Acute Rehab PT Goals Patient Stated Goal: home now Potential to Achieve Goals: Good Progress towards PT goals: Progressing toward goals  Frequency  Min 6X/week    PT Plan      Co-evaluation             End of Session Equipment Utilized During Treatment: Gait belt Activity Tolerance: Patient limited by pain Patient left:  (in WC rolling out with b/f for d/c RN aware.  )     Time: [id number] PT Time Calculation (min) (ACUTE ONLY): 19 min  Charges:  $Gait Training: 8-22 mins $Therapeutic Exercise: 8-22 mins Charge should read therapeutic  exercise only!!                   G Codes:      Florestine Aversimee J Cherene Dobbins 04/18/2016, 1:41 PM  Joycelyn RuaAimee Malene Blaydes, PTA pager [phone]

## 2016-04-18 NOTE — Progress Notes (Signed)
Physical Therapy Treatment Patient Details Name: Kaylee FillCorliss Bouton MRN: [id number] DOB: 03/09/1976 Today's Date: 04/18/2016    History of Present Illness ORIF of right bimalleolar ankle fracture should read Left bimalleolar ankle fx.    PT Comments    Pt performed increased gait distance and successful completion of stair training.  Will f/u in pm to review HEP for therapeutic exercises at home.    Follow Up Recommendations  No PT follow up     Equipment Recommendations  Rolling walker with 5" wheels    Recommendations for Other Services       Precautions / Restrictions Precautions Precautions: Fall Restrictions Weight Bearing Restrictions: Yes LLE Weight Bearing: Non weight bearing    Mobility  Bed Mobility Overal bed mobility: Modified Independent Bed Mobility: Supine to Sit     Supine to sit: Modified independent (Device/Increase time)     General bed mobility comments: Pt required use of rail to move to edge of bed but did not require assistance.    Transfers Overall transfer level: Needs assistance Equipment used: Rolling walker (2 wheeled);Crutches Transfers: Sit to/from Stand Sit to Stand: Min guard         General transfer comment: Pt performed with RW from bed and crutches from chair.  Pt required cues for hand placement to push from seated surface to improve technique.  Pt required cues for safety of crutch placement during sit to stand.    Ambulation/Gait Ambulation/Gait assistance: Min guard Ambulation Distance (Feet): 120 Feet (6 ft x2 with RW for short gait distances to and from stairs.  ) Assistive device: Crutches;Rolling walker (2 wheeled)     Gait velocity interpretation: Below normal speed for age/gender General Gait Details: NWB LLE, distance limited by pain with LLE in dependent position, min guard A for balance   Stairs Stairs: Yes Stairs assistance: Min assist Stair Management: No rails;With walker;With crutches Number of Stairs: 8  (x4 with RW and x4 with crutches.) General stair comments: Pt required cues for RW placement and crutch placement.  Cues for sequencing.  Pt reports she feels confident to teach this technique to her boyfriend to assist her into the house.    Wheelchair Mobility    Modified Rankin (Stroke Patients Only)       Balance     Sitting balance-Leahy Scale: Good       Standing balance-Leahy Scale: Poor                      Cognition Arousal/Alertness: Awake/alert Behavior During Therapy: WFL for tasks assessed/performed Overall Cognitive Status: Within Functional Limits for tasks assessed                      Exercises      General Comments        Pertinent Vitals/Pain Pain Assessment: 0-10 Pain Score: 6  Pain Location: L ankle in dependent position.   Pain Intervention(s): Monitored during session;Repositioned    Home Living                      Prior Function            PT Goals (current goals can now be found in the care plan section) Acute Rehab PT Goals Patient Stated Goal: home maybe tomorrow, be able to dress self and walk farther Potential to Achieve Goals: Good Progress towards PT goals: Progressing toward goals    Frequency  Min 6X/week  PT Plan      Co-evaluation             End of Session Equipment Utilized During Treatment: Gait belt Activity Tolerance: Patient limited by pain Patient left: with call bell/phone within reach;with family/visitor present;in bed     Time: 1610-9604 PT Time Calculation (min) (ACUTE ONLY): 22 min  Charges:  $Gait Training: 8-22 mins                    G Codes:      Florestine Avers 05-06-16, 1:14 PM  Joycelyn Rua, PTA pager [phone]

## 2016-04-18 NOTE — Progress Notes (Signed)
Subjective: 2 Days Post-Op Procedure(s) (LRB): OPEN REDUCTION INTERNAL FIXATION (ORIF) LEFT BIMALLEOLAR ANKLE FRACTURE WITH SYNDESMOSIS SCREW (Left) Patient reports pain as moderate.    Objective: Vital signs in last 24 hours: Temp:  [98.1 F (36.7 C)-98.2 F (36.8 C)] 98.1 F (36.7 C) (04/21 0517) Pulse Rate:  [81-84] 84 (04/21 0517) Resp:  [16-18] 18 (04/21 0517) BP: (124-152)/(66-90) 152/75 mmHg (04/21 0517) SpO2:  [98 %-99 %] 98 % (04/21 0517)  Intake/Output from previous day: 04/20 0701 - 04/21 0700 In: 840 [P.O.:840] Out: -  Intake/Output this shift:     Recent Labs  04/16/16 1315  HGB 12.6    Recent Labs  04/16/16 1315  WBC 6.0  RBC 4.01  HCT 39.6  PLT 232    Recent Labs  04/16/16 1315  NA 139  K 4.3  CL 108  CO2 21*  BUN 14  CREATININE 0.53  GLUCOSE 100*  CALCIUM 9.4    Recent Labs  04/16/16 1315  INR 0.93    Neurologically intact  Assessment/Plan: 2 Days Post-Op Procedure(s) (LRB): OPEN REDUCTION INTERNAL FIXATION (ORIF) LEFT BIMALLEOLAR ANKLE FRACTURE WITH SYNDESMOSIS SCREW (Left) Plan:  Discharge home office one week  Tahnee Cifuentes C 04/18/2016, 7:47 AM

## 2016-04-22 NOTE — Discharge Summary (Signed)
Patient ID: Kaylee Simpson MRN: [id number] DOB/AGE: 41-Jan-1977 40 y.o.  Admit date: 04/16/2016 Discharge date: 04/22/2016  Admission Diagnoses:  Active Problems:   Status post ORIF of fracture of ankle   Discharge Diagnoses:  Active Problems:   Status post ORIF of fracture of ankle  status post Procedure(s): OPEN REDUCTION INTERNAL FIXATION (ORIF) LEFT BIMALLEOLAR ANKLE FRACTURE WITH SYNDESMOSIS SCREW  Past Medical History  Diagnosis Date  . Complication of anesthesia   . PONV (postoperative nausea and vomiting)   . Hypertension     not a problem  . Pyelonephritis "several times"    Surgeries: Procedure(s): OPEN REDUCTION INTERNAL FIXATION (ORIF) LEFT BIMALLEOLAR ANKLE FRACTURE WITH SYNDESMOSIS SCREW on 04/16/2016   Consultants:    Discharged Condition: Improved  Hospital Course: Kaylee Simpson is an 40 y.o. female who was admitted 04/16/2016 for operative treatment of <principal problem not specified>. Patient failed conservative treatments (please see the history and physical for the specifics) and had severe unremitting pain that affects sleep, daily activities and work/hobbies. After pre-op clearance, the patient was taken to the operating room on 04/16/2016 and underwent  Procedure(s): OPEN REDUCTION INTERNAL FIXATION (ORIF) LEFT BIMALLEOLAR ANKLE FRACTURE WITH SYNDESMOSIS SCREW.    Patient was given perioperative antibiotics:  Anti-infectives    Start     Dose/Rate Route Frequency Ordered Stop   04/16/16 2200  ceFAZolin (ANCEF) IVPB 1 g/50 mL premix     1 g 100 mL/hr over 30 Minutes Intravenous Every 8 hours 04/16/16 1804 04/17/16 1432   04/16/16 1330  ceFAZolin (ANCEF) IVPB 2g/100 mL premix     2 g 200 mL/hr over 30 Minutes Intravenous On call to O.R. 04/16/16 1306 04/16/16 1550   04/16/16 1312  ceFAZolin (ANCEF) 2-4 GM/100ML-% IVPB    Comments:  Block, Sarah   : cabinet override      04/16/16 1312 04/17/16 0114       Patient was given sequential compression  devices and early ambulation to prevent DVT.   Patient benefited maximally from hospital stay and there were no complications. At the time of discharge, the patient was urinating/moving their bowels without difficulty, tolerating a regular diet, pain is controlled with oral pain medications and they have been cleared by PT/OT.   Recent vital signs: No data found.    Recent laboratory studies: No results for input(s): WBC, HGB, HCT, PLT, NA, K, CL, CO2, BUN, CREATININE, GLUCOSE, INR, CALCIUM in the last 72 hours.  Invalid input(s): PT, 2   Discharge Medications:     Medication List    STOP taking these medications        acetaminophen 325 MG tablet  Commonly known as:  TYLENOL     aspirin-acetaminophen-caffeine 250-250-65 MG tablet  Commonly known as:  EXCEDRIN MIGRAINE     metoprolol tartrate 25 MG tablet  Commonly known as:  LOPRESSOR      TAKE these medications        aspirin EC 325 MG tablet  Take 1 tablet (325 mg total) by mouth daily.     FREE SPIRIT KNEE/LEG WALKER Misc  1 Device by Does not apply route once.     methocarbamol 500 MG tablet  Commonly known as:  ROBAXIN  Take 1 tablet (500 mg total) by mouth every 6 (six) hours as needed for muscle spasms.     oxyCODONE-acetaminophen 5-325 MG tablet  Commonly known as:  PERCOCET/ROXICET  Take 1-2 tablets by mouth every 6 (six) hours as needed for severe pain.  Diagnostic Studies: Dg Tibia/fibula Left  04/11/2016  CLINICAL DATA:  Fall down stairs. Severe pain and swelling about the ankle with deformity. EXAM: LEFT TIBIA AND FIBULA - 2 VIEW COMPARISON:  Ankle radiographs of the same day. FINDINGS: There is a posterior fracture dislocation of the tibiotalar joint. A distal fibular fracture is also present. The more proximal tibia and fibula are intact. The knee is unremarkable. No ankle joint effusion is present. Extensive soft tissue swelling is present. The more proximal soft tissues are within normal limits.  IMPRESSION: 1. Posterior fracture dislocation of the tibiotalar joint with a joint effusion and extensive soft tissue swelling. 2. Distal fibular fracture. 3. The proximal tibia and fibula are within normal limits. Electronically Signed   By: Marin Robertshristopher  Mattern M.D.   On: 04/11/2016 10:03   Dg Ankle Complete Left  04/11/2016  CLINICAL DATA:  Fall down stairs. Fell backwards. Severe pain and swelling about the ankle. EXAM: LEFT ANKLE COMPLETE - 3+ VIEW COMPARISON:  None. FINDINGS: A complex comminuted posterior tibiotalar fracture dislocation is noted. There is a comminuted posterior medial tibial fracture. There is dorsal angulation of the distal fibular fracture as well. The talar dome appears to be intact. A moderate-sized joint effusion is present. Bimalleolar soft tissue swelling is evident. IMPRESSION: 1. Comminuted posterior distal tibial fracture with posterior dislocation of the tibiotalar joint. The fracture involves less than 1/4 of the articular surface of the distal tibia. 2. Dorsal angulation of distal fibular fracture. 3. Moderate-sized joint effusion. Electronically Signed   By: Marin Robertshristopher  Mattern M.D.   On: 04/11/2016 10:14   Dg Knee Complete 4 Views Right  04/17/2016  CLINICAL DATA:  Swelling and bruising in the right anterior knee after recent fall EXAM: RIGHT KNEE - COMPLETE 4+ VIEW COMPARISON:  None. FINDINGS: There is no evidence of fracture, dislocation, or joint effusion. There is no evidence of arthropathy or other focal bone abnormality. Soft tissues are unremarkable. IMPRESSION: Negative. Electronically Signed   By: Delbert PhenixJason A Poff M.D.   On: 04/17/2016 10:04   Dg Ankle Left Port  04/11/2016  CLINICAL DATA:  Status post reduction EXAM: PORTABLE LEFT ANKLE - 2 VIEW COMPARISON:  Films from earlier in the same day FINDINGS: Casting material is now noted. The distal tibial and fibular fractures have been reduced and the dislocation of the tibiotalar joint is been reduced as well. The  fracture fragments are in near anatomic alignment. IMPRESSION: Reduction of previously seen fracture dislocation. Electronically Signed   By: Alcide CleverMark  Lukens M.D.   On: 04/11/2016 11:38        Discharge Instructions    Call MD / Call 911    Complete by:  As directed   If you experience chest pain or shortness of breath, CALL 911 and be transported to the hospital emergency room.  If you develope a fever above 101 F, pus (white drainage) or increased drainage or redness at the wound, or calf pain, call your surgeon's office.     Constipation Prevention    Complete by:  As directed   Drink plenty of fluids.  Prune juice may be helpful.  You may use a stool softener, such as Colace (over the counter) 100 mg twice a day.  Use MiraLax (over the counter) for constipation as needed.     Diet - low sodium heart healthy    Complete by:  As directed      Discharge instructions    Complete by:  As directed   Do  not remove dressing/splint or get wet.  Strict non-weightbearing left leg until further notice.  Elevate left foot above heart level as much as possible. Ice off and on as needed.     Driving restrictions    Complete by:  As directed   No driving     Increase activity slowly as tolerated    Complete by:  As directed      Lifting restrictions    Complete by:  As directed   No lifting             Discharge Plan:  discharge to home      Signed: Naida Sleight  04/22/2016, 2:01 PM

## 2016-07-17 ENCOUNTER — Encounter (HOSPITAL_COMMUNITY): Payer: Self-pay | Admitting: Emergency Medicine

## 2016-07-17 ENCOUNTER — Emergency Department (HOSPITAL_COMMUNITY): Payer: Medicaid Other

## 2016-07-17 ENCOUNTER — Emergency Department (HOSPITAL_COMMUNITY)
Admission: EM | Admit: 2016-07-17 | Discharge: 2016-07-17 | Disposition: A | Discharge status: Home / Self Care | Payer: Medicaid Other | Attending: Emergency Medicine | Admitting: Emergency Medicine

## 2016-07-17 DIAGNOSIS — M25572 Pain in left ankle and joints of left foot: Secondary | ICD-10-CM | POA: Diagnosis not present

## 2016-07-17 DIAGNOSIS — I10 Essential (primary) hypertension: Secondary | ICD-10-CM | POA: Insufficient documentation

## 2016-07-17 DIAGNOSIS — Z7982 Long term (current) use of aspirin: Secondary | ICD-10-CM | POA: Diagnosis not present

## 2016-07-17 DIAGNOSIS — R0789 Other chest pain: Secondary | ICD-10-CM | POA: Insufficient documentation

## 2016-07-17 DIAGNOSIS — F17210 Nicotine dependence, cigarettes, uncomplicated: Secondary | ICD-10-CM | POA: Diagnosis not present

## 2016-07-17 DIAGNOSIS — R079 Chest pain, unspecified: Secondary | ICD-10-CM | POA: Diagnosis present

## 2016-07-17 LAB — CBC WITH DIFFERENTIAL/PLATELET
Basophils Absolute: 0 10*3/uL (ref 0.0–0.1)
Basophils Relative: 0 %
Eosinophils Absolute: 0.1 10*3/uL (ref 0.0–0.7)
Eosinophils Relative: 1 %
HEMATOCRIT: 40.8 % (ref 36.0–46.0)
HEMOGLOBIN: 13.4 g/dL (ref 12.0–15.0)
LYMPHS ABS: 3.4 10*3/uL (ref 0.7–4.0)
LYMPHS PCT: 45 %
MCH: 31.5 pg (ref 26.0–34.0)
MCHC: 32.8 g/dL (ref 30.0–36.0)
MCV: 96 fL (ref 78.0–100.0)
MONOS PCT: 6 %
Monocytes Absolute: 0.5 10*3/uL (ref 0.1–1.0)
NEUTROS ABS: 3.6 10*3/uL (ref 1.7–7.7)
NEUTROS PCT: 48 %
Platelets: 251 10*3/uL (ref 150–400)
RBC: 4.25 MIL/uL (ref 3.87–5.11)
RDW: 13.7 % (ref 11.5–15.5)
WBC: 7.5 10*3/uL (ref 4.0–10.5)

## 2016-07-17 LAB — I-STAT TROPONIN, ED
Troponin i, poc: 0 ng/mL (ref 0.00–0.08)
Troponin i, poc: 0 ng/mL (ref 0.00–0.08)

## 2016-07-17 LAB — BASIC METABOLIC PANEL
Anion gap: 6 (ref 5–15)
BUN: 8 mg/dL (ref 6–20)
CALCIUM: 9.1 mg/dL (ref 8.9–10.3)
CO2: 22 mmol/L (ref 22–32)
CREATININE: 0.64 mg/dL (ref 0.44–1.00)
Chloride: 111 mmol/L (ref 101–111)
GFR calc non Af Amer: >60 mL/min (ref >60)
GLUCOSE: 91 mg/dL (ref 65–99)
Potassium: 3.5 mmol/L (ref 3.5–5.1)
Sodium: 139 mmol/L (ref 135–145)

## 2016-07-17 LAB — D-DIMER, QUANTITATIVE: D-Dimer, Quant: 0.27 ug/mL-FEU (ref 0.00–0.50)

## 2016-07-17 LAB — I-STAT BETA HCG BLOOD, ED (MC, WL, AP ONLY): I-stat hCG, quantitative: <5 m[IU]/mL (ref <5)

## 2016-07-17 MED ORDER — MORPHINE SULFATE (PF) 4 MG/ML IV SOLN
4.0000 mg | Freq: Once | INTRAVENOUS | Status: AC
  Administered 2016-07-17: 4 mg via INTRAVENOUS
  Filled 2016-07-17: qty 1

## 2016-07-17 NOTE — ED Notes (Signed)
Patient transported to X-ray 

## 2016-07-17 NOTE — ED Notes (Signed)
Provided patient's family member with Mal[place]key sandwich and sprite.

## 2016-07-17 NOTE — ED Provider Notes (Addendum)
CSN: [id number]     Arrival date & time 07/17/16  1429 History   First MD Initiated Contact with Patient 07/17/16 1958     Chief Complaint  Patient presents with  . Chest Pain     (Consider location/radiation/quality/duration/timing/severity/associated sxs/prior Treatment) HPI Comments: Patient presents with chest pain. She states it started about 1:00 today. It's sharp pain across her chest. It's worse with coughing and moving. She denies any significant cough. No fevers. No injuries to her chest. No prior history of heart problems. She denies any fevers. No URI symptoms. She has had some lightheadedness associated with the pain. She states she does have a family history of heart disease in that her dad had an MI at age 79. She is a smoker. She reports a past history of hypertension but states she doesn't currently have hypertension and that her blood pressures have been normal. Denies any history of hyperlipidemia or diabetes. She also complains of pain to her left ankle. She has a recent fracture of her left ankle in April which required operative fixation. She's been wearing a walking boot. She states when she took the boot off today she noticed her ankle was swollen and painful.  Patient is a 40 y.o. female presenting with chest pain.  Chest Pain Associated symptoms: nausea and shortness of breath   Associated symptoms: no abdominal pain, no back pain, no cough, no diaphoresis, no dizziness, no fatigue, no fever, no headache, no numbness, not vomiting and no weakness     Past Medical History  Diagnosis Date  . Complication of anesthesia   . PONV (postoperative nausea and vomiting)   . Hypertension     not a problem  . Pyelonephritis "several times"   Past Surgical History  Procedure Laterality Date  . Percutaneous pinning toe fracture Right 2011    great toe and middle  . Fracture surgery    . Orif ankle fracture bimalleolar Left 04/16/2016    ORIF syndesmosis.  . Dilation and  curettage of uterus  1997; 1998  . Orif ankle fracture Left 04/16/2016    Procedure: OPEN REDUCTION INTERNAL FIXATION (ORIF) LEFT BIMALLEOLAR ANKLE FRACTURE WITH SYNDESMOSIS SCREW;  Surgeon: Eldred Manges, MD;  Location: [facility] OR;  Service: Orthopedics;  Laterality: Left;   History reviewed. No pertinent family history. Social History  Substance Use Topics  . Smoking status: Current Every Day Smoker -- 0.25 packs/day for 10 years    Types: Cigarettes  . Smokeless tobacco: Never Used  . Alcohol Use: 3.6 oz/week    6 Cans of beer per week   OB History    No data available     Review of Systems  Constitutional: Negative for fever, chills, diaphoresis and fatigue.  HENT: Negative for congestion, rhinorrhea and sneezing.   Eyes: Negative.   Respiratory: Positive for shortness of breath. Negative for cough and chest tightness.   Cardiovascular: Positive for chest pain. Negative for leg swelling.  Gastrointestinal: Positive for nausea. Negative for vomiting, abdominal pain, diarrhea and blood in stool.  Genitourinary: Negative for frequency, hematuria, flank pain and difficulty urinating.  Musculoskeletal: Positive for arthralgias. Negative for back pain.  Skin: Negative for rash.  Neurological: Positive for light-headedness. Negative for dizziness, speech difficulty, weakness, numbness and headaches.      Allergies  Review of patient's allergies indicates no known allergies.  Home Medications   Prior to Admission medications   Medication Sig Start Date End Date Taking? Authorizing Provider  acetaminophen (TYLENOL) 500 MG  tablet Take 500 mg by mouth every 6 (six) hours as needed for mild pain.    Yes Historical Provider, MD  diclofenac sodium (VOLTAREN) 1 % GEL Apply 2 g topically 5 (five) times daily.   Yes Historical Provider, MD  aspirin EC 325 MG tablet Take 1 tablet (325 mg total) by mouth daily. 04/17/16   Naida Sleight, PA-C  methocarbamol (ROBAXIN) 500 MG tablet Take 1 tablet  (500 mg total) by mouth every 6 (six) hours as needed for muscle spasms. 04/17/16   Naida Sleight, PA-C  Misc. Devices (FREE SPIRIT KNEE/LEG WALKER) MISC 1 Device by Does not apply route once. 04/11/16   Lyndal Pulley, MD  oxyCODONE-acetaminophen (PERCOCET/ROXICET) 5-325 MG tablet Take 1-2 tablets by mouth every 6 (six) hours as needed for severe pain. 04/17/16   Naida Sleight, PA-C   BP 166/100 mmHg  Pulse 82  Temp(Src) 98.8 F (37.1 C) (Oral)  Resp 17  SpO2 98%  LMP 07/02/2016 Physical Exam  Constitutional: She is oriented to person, place, and time. She appears well-developed and well-nourished.  HENT:  Head: Normocephalic and atraumatic.  Eyes: Pupils are equal, round, and reactive to light.  Neck: Normal range of motion. Neck supple.  Cardiovascular: Normal rate, regular rhythm and normal heart sounds.   Pulmonary/Chest: Effort normal and breath sounds normal. No respiratory distress. She has no wheezes. She has no rales. She exhibits tenderness (Positive reproducible tenderness across the anterior chest wall).  Abdominal: Soft. Bowel sounds are normal. There is no tenderness. There is no rebound and no guarding.  Musculoskeletal: Normal range of motion. She exhibits no edema.  There is swelling around the ankle joint. No warmth or erythema. There is mild swelling to the lower leg. No calf tenderness. Pedal pulses are intact.  Lymphadenopathy:    She has no cervical adenopathy.  Neurological: She is alert and oriented to person, place, and time.  Skin: Skin is warm and dry. No rash noted.  Psychiatric: She has a normal mood and affect.    ED Course  Procedures (including critical care time) Labs Review Results for orders placed or performed during the hospital encounter of 07/17/16  Basic metabolic panel  Result Value Ref Range   Sodium 139 135 - 145 mmol/L   Potassium 3.5 3.5 - 5.1 mmol/L   Chloride 111 101 - 111 mmol/L   CO2 22 22 - 32 mmol/L   Glucose, Bld 91 65 - 99 mg/dL    BUN 8 6 - 20 mg/dL   Creatinine, Ser 1.61 0.44 - 1.00 mg/dL   Calcium 9.1 8.9 - 09.6 mg/dL   GFR calc non Af Amer >60 >60 mL/min   GFR calc Af Amer >60 >60 mL/min   Anion gap 6 5 - 15  CBC with Differential  Result Value Ref Range   WBC 7.5 4.0 - 10.5 K/uL   RBC 4.25 3.87 - 5.11 MIL/uL   Hemoglobin 13.4 12.0 - 15.0 g/dL   HCT 04.5 40.9 - 81.1 %   MCV 96.0 78.0 - 100.0 fL   MCH 31.5 26.0 - 34.0 pg   MCHC 32.8 30.0 - 36.0 g/dL   RDW 91.4 78.2 - 95.6 %   Platelets 251 150 - 400 K/uL   Neutrophils Relative % 48 %   Neutro Abs 3.6 1.7 - 7.7 K/uL   Lymphocytes Relative 45 %   Lymphs Abs 3.4 0.7 - 4.0 K/uL   Monocytes Relative 6 %   Monocytes Absolute 0.5 0.1 -  1.0 K/uL   Eosinophils Relative 1 %   Eosinophils Absolute 0.1 0.0 - 0.7 K/uL   Basophils Relative 0 %   Basophils Absolute 0.0 0.0 - 0.1 K/uL  D-dimer, quantitative  Result Value Ref Range   D-Dimer, Quant 0.27 0.00 - 0.50 ug/mL-FEU  I-stat troponin, ED  Result Value Ref Range   Troponin i, poc 0.00 0.00 - 0.08 ng/mL   Comment 3          I-Stat Beta hCG blood, ED ([facility], [facility], [facility] only)  Result Value Ref Range   I-stat hCG, quantitative <5.0 <5 mIU/mL   Comment 3          I-stat troponin, ED  Result Value Ref Range   Troponin i, poc 0.00 0.00 - 0.08 ng/mL   Comment 3           Dg Chest 2 View  07/17/2016  CLINICAL DATA:  Left side chest pain EXAM: CHEST  2 VIEW COMPARISON:  None. FINDINGS: The heart size and mediastinal contours are within normal limits. Both lungs are clear. The visualized skeletal structures are unremarkable. IMPRESSION: No active cardiopulmonary disease. Electronically Signed   By: Natasha Mead M.D.   On: 07/17/2016 15:23   Dg Ankle Complete Left  07/17/2016  CLINICAL DATA:  40 y/o F; recent open reduction and internal fixation of the left ankle. Patient is concerned she re-injury the ankle. There is pain across the top of the foot and ankle joint. EXAM: LEFT ANKLE COMPLETE - 3+ VIEW COMPARISON:  Left  ankle radiographs dated 05/28/2016. FINDINGS: There is a plate along the lateral aspect of the distal fibula fixed by multiple transversely oriented screw is including a screw traversing the distal tibial fibular joint. Hardware is intact and there is no prior hardware related complication. There is a persistent fracture line through the posterior tibial plateau similar in configuration in comparison with prior radiographs. No new fracture is identified. No dislocation. The talar dome and ankle mortise appear intact. IMPRESSION: No new fracture or dislocation is identified. Fixation hardware is intact without apparent hardware related complication. Persistent incomplete fusion of the posterior tibial plateau fracture. Electronically Signed   By: Mitzi Hansen M.D.   On: 07/17/2016 21:22      Imaging Review Dg Chest 2 View  07/17/2016  CLINICAL DATA:  Left side chest pain EXAM: CHEST  2 VIEW COMPARISON:  None. FINDINGS: The heart size and mediastinal contours are within normal limits. Both lungs are clear. The visualized skeletal structures are unremarkable. IMPRESSION: No active cardiopulmonary disease. Electronically Signed   By: Natasha Mead M.D.   On: 07/17/2016 15:23   Dg Ankle Complete Left  07/17/2016  CLINICAL DATA:  40 y/o F; recent open reduction and internal fixation of the left ankle. Patient is concerned she re-injury the ankle. There is pain across the top of the foot and ankle joint. EXAM: LEFT ANKLE COMPLETE - 3+ VIEW COMPARISON:  Left ankle radiographs dated 05/28/2016. FINDINGS: There is a plate along the lateral aspect of the distal fibula fixed by multiple transversely oriented screw is including a screw traversing the distal tibial fibular joint. Hardware is intact and there is no prior hardware related complication. There is a persistent fracture line through the posterior tibial plateau similar in configuration in comparison with prior radiographs. No new fracture is  identified. No dislocation. The talar dome and ankle mortise appear intact. IMPRESSION: No new fracture or dislocation is identified. Fixation hardware is intact without apparent hardware  related complication. Persistent incomplete fusion of the posterior tibial plateau fracture. Electronically Signed   By: Mitzi HansenLance  Furusawa-Stratton M.D.   On: 07/17/2016 21:22   I have personally reviewed and evaluated these images and lab results as part of my medical decision-making.   EKG Interpretation   Date/Time:  Thursday July 17 2016 14:37:15 EDT Ventricular Rate:  94 PR Interval:  142 QRS Duration: 82 QT Interval:  386 QTC Calculation: 482 R Axis:   88 Text Interpretation:  Normal sinus rhythm Septal infarct , age  undetermined Abnormal ECG T wave inversion lead III with some T wave  flattening in II and aVF Confirmed by Delainey Winstanley  MD, Melvyn Hommes ([id number]) on  07/17/2016 8:21:12 PM      MDM   Final diagnoses:  Ankle pain, left  Chest pain, unspecified chest pain type    Patient presents with chest pain. It seems to be musculoskeletal in nature. It's worse with movement and worse with palpation. It's worse with coughing. Her d-dimer is negative and she has no other suggestions of PE. Her chest x-ray is clear. There is no evidence of pneumonia or pneumothorax. Her EKG showed some mild T-wave inversion but she's had 2 negative troponins and her symptoms don't sound cardiac in nature. She also has some associated left ankle pain. There is mild swelling to the ankle. There is no new fractures evident on x-ray. The hardware appears intact. There is no evidence of infection. She was discharged home in good condition. She was encouraged to follow-up with her PCP. Her blood pressure has been elevated in the ED and she'll need to have this rechecked. I encouraged her to follow-up with her orthopedist if her ankle pain continues. She has an upcoming appointment within the next couple of weeks. She was advised return  here if she has any worsening symptoms.She has oxycodone to take at home. I also advised her she can use ibuprofen.    Rolan BuccoMelanie Merriel Zinger, MD 07/17/16 [id number]  Rolan BuccoMelanie Shanine Kreiger, MD 07/17/16 2250

## 2016-07-17 NOTE — ED Notes (Signed)
Pt sts left sided CP and near syncope; pt only answering some questions appearing to be by choice

## 2016-07-17 NOTE — ED Notes (Signed)
Patient verbalized understanding of discharge instructions and denies any further needs or questions at this time. VS stable. Patient ambulatory with steady gait, ambulating with walking boot on L ankle. Escorted to ED entrance in wheelchair.

## 2016-07-17 NOTE — Discharge Instructions (Signed)
Chest Wall Pain Chest wall pain is pain in or around the bones and muscles of your chest. Sometimes, an injury causes this pain. Sometimes, the cause may not be known. This pain may take several weeks or longer to get better. HOME CARE INSTRUCTIONS  Pay attention to any changes in your symptoms. Take these actions to help with your pain:   Rest as told by your health care provider.   Avoid activities that cause pain. These include any activities that use your chest muscles or your abdominal and side muscles to lift heavy items.   If directed, apply ice to the painful area:  Put ice in a plastic bag.  Place a towel between your skin and the bag.  Leave the ice on for 20 minutes, 2-3 times per day.  Take over-the-counter and prescription medicines only as told by your health care provider.  Do not use tobacco products, including cigarettes, chewing tobacco, and e-cigarettes. If you need help quitting, ask your health care provider.  Keep all follow-up visits as told by your health care provider. This is important. SEEK MEDICAL CARE IF:  You have a fever.  Your chest pain becomes worse.  You have new symptoms. SEEK IMMEDIATE MEDICAL CARE IF:  You have nausea or vomiting.  You feel sweaty or light-headed.  You have a cough with phlegm (sputum) or you cough up blood.  You develop shortness of breath.   This information is not intended to replace advice given to you by your health care provider. Make sure you discuss any questions you have with your health care provider.   Document Released: 12/15/2005 Document Revised: 09/05/2015 Document Reviewed: 03/12/2015 Elsevier Interactive Patient Education 2016 [employer]  Ankle Pain Ankle pain is a common symptom. The bones, cartilage, tendons, and muscles of the ankle joint perform a lot of work each day. The ankle joint holds your body weight and allows you to move around. Ankle pain can occur on either side or back of 1 or  both ankles. Ankle pain may be sharp and burning or dull and aching. There may be tenderness, stiffness, redness, or warmth around the ankle. The pain occurs more often when a person walks or puts pressure on the ankle. CAUSES  There are many reasons ankle pain can develop. It is important to work with your caregiver to identify the cause since many conditions can impact the bones, cartilage, muscles, and tendons. Causes for ankle pain include:  Injury, including a break (fracture), sprain, or strain often due to a fall, sports, or a high-impact activity.  Swelling (inflammation) of a tendon (tendonitis).  Achilles tendon rupture.  Ankle instability after repeated sprains and strains.  Poor foot alignment.  Pressure on a nerve (tarsal tunnel syndrome).  Arthritis in the ankle or the lining of the ankle.  Crystal formation in the ankle (gout or pseudogout). DIAGNOSIS  A diagnosis is based on your medical history, your symptoms, results of your physical exam, and results of diagnostic tests. Diagnostic tests may include X-ray exams or a computerized magnetic scan (magnetic resonance imaging, MRI). TREATMENT  Treatment will depend on the cause of your ankle pain and may include:  Keeping pressure off the ankle and limiting activities.  Using crutches or other walking support (a cane or brace).  Using rest, ice, compression, and elevation.  Participating in physical therapy or home exercises.  Wearing shoe inserts or special shoes.  Losing weight.  Taking medications to reduce pain or swelling or receiving an  injection.  Undergoing surgery. HOME CARE INSTRUCTIONS   Only take over-the-counter or prescription medicines for pain, discomfort, or fever as directed by your caregiver.  Put ice on the injured area.  Put ice in a plastic bag.  Place a towel between your skin and the bag.  Leave the ice on for 15-20 minutes at a time, 03-04 times a day.  Keep your leg raised  (elevated) when possible to lessen swelling.  Avoid activities that cause ankle pain.  Follow specific exercises as directed by your caregiver.  Record how often you have ankle pain, the location of the pain, and what it feels like. This information may be helpful to you and your caregiver.  Ask your caregiver about returning to work or sports and whether you should drive.  Follow up with your caregiver for further examination, therapy, or testing as directed. SEEK MEDICAL CARE IF:   Pain or swelling continues or worsens beyond 1 week.  You have an oral temperature above 102 F (38.9 C).  You are feeling unwell or have chills.  You are having an increasingly difficult time with walking.  You have loss of sensation or other new symptoms.  You have questions or concerns. MAKE SURE YOU:   Understand these instructions.  Will watch your condition.  Will get help right away if you are not doing well or get worse.   This information is not intended to replace advice given to you by your health care provider. Make sure you discuss any questions you have with your health care provider.   Document Released: 06/04/2010 Document Revised: 03/08/2012 Document Reviewed: 07/17/2015 Elsevier Interactive Patient Education [employer]

## 2016-07-17 NOTE — ED Notes (Signed)
Patient returned from xray.

## 2016-11-03 ENCOUNTER — Ambulatory Visit (HOSPITAL_COMMUNITY)
Admission: EM | Admit: 2016-11-03 | Discharge: 2016-11-03 | Disposition: A | Discharge status: Home / Self Care | Payer: Medicaid Other | Attending: Family Medicine | Admitting: Family Medicine

## 2016-11-03 ENCOUNTER — Inpatient Hospital Stay (HOSPITAL_COMMUNITY)
Admission: AD | Admit: 2016-11-03 | Discharge: 2016-11-03 | Disposition: A | Discharge status: Home / Self Care | Payer: Medicaid Other | Source: Ambulatory Visit | Attending: Family Medicine | Admitting: Family Medicine

## 2016-11-03 ENCOUNTER — Encounter (HOSPITAL_COMMUNITY): Payer: Self-pay | Admitting: Emergency Medicine

## 2016-11-03 ENCOUNTER — Encounter (HOSPITAL_COMMUNITY): Payer: Self-pay | Admitting: *Deleted

## 2016-11-03 DIAGNOSIS — I10 Essential (primary) hypertension: Secondary | ICD-10-CM | POA: Diagnosis not present

## 2016-11-03 DIAGNOSIS — Z7982 Long term (current) use of aspirin: Secondary | ICD-10-CM | POA: Insufficient documentation

## 2016-11-03 DIAGNOSIS — R102 Pelvic and perineal pain: Secondary | ICD-10-CM | POA: Diagnosis present

## 2016-11-03 DIAGNOSIS — N750 Cyst of Bartholin's gland: Secondary | ICD-10-CM

## 2016-11-03 DIAGNOSIS — F17210 Nicotine dependence, cigarettes, uncomplicated: Secondary | ICD-10-CM | POA: Insufficient documentation

## 2016-11-03 DIAGNOSIS — N751 Abscess of Bartholin's gland: Secondary | ICD-10-CM | POA: Diagnosis not present

## 2016-11-03 LAB — URINALYSIS, ROUTINE W REFLEX MICROSCOPIC
Bilirubin Urine: NEGATIVE
Glucose, UA: NEGATIVE mg/dL
Ketones, ur: NEGATIVE mg/dL
Leukocytes, UA: NEGATIVE
Nitrite: NEGATIVE
Protein, ur: NEGATIVE mg/dL
SPECIFIC GRAVITY, URINE: 1.02 (ref 1.005–1.030)
pH: 6.5 (ref 5.0–8.0)

## 2016-11-03 LAB — URINE MICROSCOPIC-ADD ON

## 2016-11-03 MED ORDER — IBUPROFEN 800 MG PO TABS
800.0000 mg | ORAL_TABLET | Freq: Once | ORAL | Status: AC
  Administered 2016-11-03: 800 mg via ORAL
  Filled 2016-11-03: qty 1

## 2016-11-03 MED ORDER — OXYCODONE-ACETAMINOPHEN 5-325 MG PO TABS
1.0000 | ORAL_TABLET | Freq: Once | ORAL | Status: AC
  Administered 2016-11-03: 1 via ORAL
  Filled 2016-11-03: qty 1

## 2016-11-03 NOTE — ED Provider Notes (Signed)
[facility]    CSN: [id number] Arrival date & time: 11/03/16  1846     History   Chief Complaint Chief Complaint  Patient presents with  . Abscess    HPI Kaylee Simpson is a 40 y.o. female.   The history is provided by the patient.  Abscess  Location:  Pelvis Pelvic abscess location:  Vulva Abscess quality: fluctuance, painful and redness   Red streaking: no   Duration:  3 days Progression:  Worsening Pain details:    Quality:  Throbbing   Severity:  Moderate Chronicity:  Recurrent (h/o similar problem in 2011, recurrent this past fri) Relieved by:  None tried Worsened by:  Nothing Ineffective treatments:  None tried Risk factors: prior abscess     Past Medical History:  Diagnosis Date  . Complication of anesthesia   . Hypertension    not a problem  . PONV (postoperative nausea and vomiting)   . Pyelonephritis "several times"    Patient Active Problem List   Diagnosis Date Noted  . Status post ORIF of fracture of ankle 04/16/2016  . Anemia 10/08/2012  . Pyelonephritis 10/05/2012  . Hypokalemia 10/05/2012  . Hyponatremia 10/05/2012  . Tachycardia 10/05/2012  . SIRS (systemic inflammatory response syndrome) (HCC) 10/05/2012  . Acute renal insufficiency 10/05/2012    Past Surgical History:  Procedure Laterality Date  . DILATION AND CURETTAGE OF UTERUS  1997; 1998  . FRACTURE SURGERY    . ORIF ANKLE FRACTURE Left 04/16/2016   Procedure: OPEN REDUCTION INTERNAL FIXATION (ORIF) LEFT BIMALLEOLAR ANKLE FRACTURE WITH SYNDESMOSIS SCREW;  Surgeon: Eldred MangesMark C Yates, MD;  Location: [facility] OR;  Service: Orthopedics;  Laterality: Left;  . ORIF ANKLE FRACTURE BIMALLEOLAR Left 04/16/2016   ORIF syndesmosis.  Marland Kitchen. PERCUTANEOUS PINNING TOE FRACTURE Right 2011   great toe and middle    OB History    No data available       Home Medications    Prior to Admission medications   Medication Sig Start Date End Date Taking? Authorizing Provider  acetaminophen  (TYLENOL) 500 MG tablet Take 500 mg by mouth every 6 (six) hours as needed for mild pain.     Historical Provider, MD  aspirin EC 325 MG tablet Take 1 tablet (325 mg total) by mouth daily. 04/17/16   Naida SleightJames M Owens, PA-C  diclofenac sodium (VOLTAREN) 1 % GEL Apply 2 g topically 5 (five) times daily.    Historical Provider, MD  methocarbamol (ROBAXIN) 500 MG tablet Take 1 tablet (500 mg total) by mouth every 6 (six) hours as needed for muscle spasms. 04/17/16   Naida SleightJames M Owens, PA-C  Misc. Devices (FREE SPIRIT KNEE/LEG WALKER) MISC 1 Device by Does not apply route once. 04/11/16   Lyndal Pulleyaniel Knott, MD  oxyCODONE-acetaminophen (PERCOCET/ROXICET) 5-325 MG tablet Take 1-2 tablets by mouth every 6 (six) hours as needed for severe pain. 04/17/16   Naida SleightJames M Owens, PA-C    Family History No family history on file.  Social History Social History  Substance Use Topics  . Smoking status: Current Every Day Smoker    Packs/day: 0.25    Years: 10.00    Types: Cigarettes  . Smokeless tobacco: Never Used  . Alcohol use 3.6 oz/week    6 Cans of beer per week     Allergies   Patient has no known allergies.   Review of Systems Review of Systems  Gastrointestinal: Negative.   Genitourinary: Positive for genital sores and pelvic pain. Negative for vaginal bleeding and  vaginal discharge.  All other systems reviewed and are negative.    Physical Exam Triage Vital Signs ED Triage Vitals  Enc Vitals Group     BP 11/03/16 1922 148/98     Pulse Rate 11/03/16 1922 92     Resp 11/03/16 1922 17     Temp 11/03/16 1922 98.9 F (37.2 C)     Temp Source 11/03/16 1922 Oral     SpO2 11/03/16 1922 100 %     Weight 11/03/16 1925 168 lb (76.2 kg)     Height 11/03/16 1923 5\' 6"  (1.676 m)     Head Circumference --      Peak Flow --      Pain Score 11/03/16 1924 10     Pain Loc --      Pain Edu? --      Excl. in GC? --    No data found.   Updated Vital Signs BP 148/98 (BP Location: Left Arm)   Pulse 92    Temp 98.9 F (37.2 C) (Oral)   Resp 17   Ht 5\' 6"  (1.676 m)   Wt 168 lb (76.2 kg)   LMP 10/02/2016   SpO2 100%   BMI 27.12 kg/m   Visual Acuity Right Eye Distance:   Left Eye Distance:   Bilateral Distance:    Right Eye Near:   Left Eye Near:    Bilateral Near:     Physical Exam  Constitutional: She appears well-developed and well-nourished. She appears distressed.  Abdominal: Soft. Bowel sounds are normal.  Genitourinary:    Pelvic exam was performed with patient supine. There is tenderness and lesion on the right labia.  Nursing note and vitals reviewed.    UC Treatments / Results  Labs (all labs ordered are listed, but only abnormal results are displayed) Labs Reviewed - No data to display  EKG  EKG Interpretation None       Radiology No results found.  Procedures Procedures (including critical care time)  Medications Ordered in UC Medications - No data to display   Initial Impression / Assessment and Plan / UC Course  I have reviewed the triage vital signs and the nursing notes.  Pertinent labs & imaging results that were available during my care of the patient were reviewed by me and considered in my medical decision making (see chart for details).  Clinical Course       Final Clinical Impressions(s) / UC Diagnoses   Final diagnoses:  None    New Prescriptions New Prescriptions   No medications on file     Linna HoffJames D Catriona Dillenbeck, MD 11/03/16 2003

## 2016-11-03 NOTE — MAU Provider Note (Addendum)
History     CSN: [id number]  Arrival date and time: 11/03/16 2021   None     No chief complaint on file.  HPI Seen for non-radiating pain right lower vaginal wall. Started Thursday, got bigger Friday. Did spontaneously drain some on Friday, but opening closed up the next day and has been getting worse since then. No fevers, chills, nausea, vomiting, vaginal discharge. Has had a bartholin's cyst before - this feels the same. Pain constant.   OB History    Gravida Para Term Preterm AB Living   6 4   4 2 4    SAB TAB Ectopic Multiple Live Births                  Past Medical History:  Diagnosis Date  . Complication of anesthesia   . Hypertension    not a problem  . PONV (postoperative nausea and vomiting)   . Pyelonephritis "several times"    Past Surgical History:  Procedure Laterality Date  . DILATION AND CURETTAGE OF UTERUS  1997; 1998  . FRACTURE SURGERY    . ORIF ANKLE FRACTURE Left 04/16/2016   Procedure: OPEN REDUCTION INTERNAL FIXATION (ORIF) LEFT BIMALLEOLAR ANKLE FRACTURE WITH SYNDESMOSIS SCREW;  Surgeon: Eldred MangesMark C Yates, MD;  Location: [facility];  Service: Orthopedics;  Laterality: Left;  . ORIF ANKLE FRACTURE BIMALLEOLAR Left 04/16/2016   ORIF syndesmosis.  Marland Kitchen. PERCUTANEOUS PINNING TOE FRACTURE Right 2011   great toe and middle    No family history on file.  Social History  Substance Use Topics  . Smoking status: Current Every Day Smoker    Packs/day: 0.25    Years: 10.00    Types: Cigarettes  . Smokeless tobacco: Never Used  . Alcohol use 3.6 oz/week    6 Cans of beer per week    Allergies: No Known Allergies  Prescriptions Prior to Admission  Medication Sig Dispense Refill Last Dose  . acetaminophen (TYLENOL) 500 MG tablet Take 500 mg by mouth every 6 (six) hours as needed for mild pain.    07/17/2016 at Unknown time  . aspirin EC 325 MG tablet Take 1 tablet (325 mg total) by mouth daily. 30 tablet 0   . diclofenac sodium (VOLTAREN) 1 % GEL Apply 2 g  topically 5 (five) times daily.   07/17/2016 at Unknown time  . methocarbamol (ROBAXIN) 500 MG tablet Take 1 tablet (500 mg total) by mouth every 6 (six) hours as needed for muscle spasms. 60 tablet 0   . Misc. Devices (FREE SPIRIT KNEE/LEG WALKER) MISC 1 Device by Does not apply route once. 1 each 0   . oxyCODONE-acetaminophen (PERCOCET/ROXICET) 5-325 MG tablet Take 1-2 tablets by mouth every 6 (six) hours as needed for severe pain. 60 tablet 0     ROS Physical Exam   Blood pressure (!) 175/110, pulse 86, temperature 98.4 F (36.9 C), temperature source Oral, resp. rate 16, height 5\' 6"  (1.676 m), weight 168 lb (76.2 kg), last menstrual period 10/02/2016.  Physical Exam  Constitutional: She appears well-developed and well-nourished.  HENT:  Head: Normocephalic and atraumatic.  Right Ear: External ear normal.  Left Ear: External ear normal.  Eyes: Pupils are equal, round, and reactive to light.  Cardiovascular: Normal rate.   Respiratory: Effort normal.  GI: Soft. Bowel sounds are normal. She exhibits no distension. There is no tenderness. There is no rebound and no guarding.  Genitourinary:     Skin: Skin is warm and dry.  Psychiatric: She has  a normal mood and affect. Her behavior is normal. Judgment and thought content normal.    MAU Course  Procedures Bartholin Cyst I&D and Word Catheter Placement Enlarged abscess palpated in front of the hymenal ring around 5 or 7 o' clock.  Written informed consent was obtained.  Discussed complications and possible outcomes of procedure including recurrence of cyst, scarring leading to infecton, bleeding, dyspareunia, distortion of anatomy.  Patient was examined in the dorsal lithotomy position and mass was identified.  The area was prepped with Iodine and draped in a sterile manner. 1% Lidocaine (3 ml) was then used to infiltrate area on top of the cyst, behind the hymenal ring.  A 7 mm incision was made using a sterile scapel. Upon palpation  of the mass, a moderate amount of bloody purulent drainage was expressed through the incision. A hemostat was used to break up loculations, which resulted in expression of more bloody purulent drainage.  Samples of the drainage were sent for cultures. The open cyst was then copiously irrigated with normal saline, and a Word catheter was placed. 1.5 ml of sterile water was used to inflate the catheter balloon.  The end of the catheter was tucked into the vagina.  Patient tolerated the procedure well, reported feeling " a lot better." - Bactrim DS bid x 7 days for treatment - Recommended Sitz baths bid and Motrin and Percocet was given  prn pain.   She was told to call to be examined if she experiences increasing swelling, pain, vaginal discharge, or fever.  - She was instructed to wear a peripad to absorb discharge, and to maintain pelvic rest while the Word catheter is in place.  -The catheter will be left in place for at least two to four weeks to promote formation of an epithelialized tract for permanent drainage of glandular secretions.  - She [facility] now for removal of word catheter.   Assessment and Plan  1. Bartholin's Cyst.  No abscess.  S/p I&D.  Word catheter x 2-4 weeks.  F/u in office in 4 weeks, soon if worsens.  Continue ibuprofen 2. Hypertension  Asymptomatic  Pt to see PCP  BP earlier today 148/98.  STINSON, JACOB JEHIEL 11/03/2016, 9:31 PM

## 2016-11-03 NOTE — ED Triage Notes (Signed)
Pt. Stated, I have a bartholinian cyst on the right side. It started on Friday.

## 2016-11-03 NOTE — Discharge Instructions (Signed)
[facility].

## 2016-11-03 NOTE — Discharge Instructions (Signed)
Bartholin Cyst or Abscess A Bartholin cyst is a fluid-filled sac that forms on a Bartholin gland. Bartholin glands are small glands that are located within the folds of skin (labia) along the sides of the lower opening of the vagina. These glands produce a fluid to moisten the outside of the vagina during sexual intercourse. A Bartholin cyst causes a bulge on the side of the vagina. A cyst that is not large or infected may not cause symptoms or problems. However, if the fluid within the cyst becomes infected, the cyst can turn into an abscess. An abscess may cause discomfort or pain. CAUSES A Bartholin cyst may develop when the duct of the gland becomes blocked. In many cases, the cause of this is not known. Various kinds of bacteria can cause the cyst to become infected and develop into an abscess. RISK FACTORS You may be at an increased risk of developing a Bartholin cyst or abscess if:  You are a woman of reproductive age.  You have a history of previous Bartholin cysts or abscesses.  You have diabetes.  You have a sexually transmitted disease (STD). SIGNS AND SYMPTOMS The severity of symptoms varies depending on the size of the cyst and whether it is infected. Symptoms may include:  A bulge or swelling near the lower opening of your vagina.  Discomfort or pain.  Redness.  Pain during sexual intercourse.  Pain when walking.  Fluid draining from the area. DIAGNOSIS Your health care provider may make a diagnosis based on your symptoms and a physical exam. He or she will look for swelling in your vaginal area. Blood tests may be done to check for infections. A sample of fluid from the cyst or abscess may also be taken to be tested in a lab. TREATMENT Small cysts that are not infected may not require any treatment. These often go away on their own. Yourhealth care provider will recommend hot baths and the use of warm compresses. These may also be part of the treatment for an abscess.  Treatment options for a large cyst or abscess may include:   Antibiotic medicine.  A surgical procedure to drain the abscess. One of the following procedures may be done:  Incision and drainage. An incision is made in the cyst or abscess so that the fluid drains out. A catheter may be placed inside the cyst so that it does not close and fill up with fluid again. The catheter will be removed after you have a follow-up visit with a specialist (gynecologist).  Marsupialization. The cyst or abscess is opened and kept open by stitching the edges of the skin to the walls of the cyst or abscess. This allows it to continue to drain and not fill up with fluid again. If you have cysts or abscesses that keep returning and have required incision and drainage multiple times, your health care provider may talk to you about surgery to remove the Bartholin gland. HOME CARE INSTRUCTIONS  Take medicines only as directed by your health care provider.  If you were prescribed an antibiotic medicine, finish it all even if you start to feel better.  Apply warm, wet compresses to the area or take warm, shallow baths that cover your pelvic region (sitz baths) several times a day or as directed by your health care provider.  Do not squeeze the cyst or apply heavy pressure to it.  Do not have sexual intercourse until the cyst has gone away.  If your cyst or abscess was   opened, a small piece of gauze or a drain may have been placed in the area to allow drainage. Do not remove the gauze or the drain until directed by your health care provider.  Wear feminine pads--not tampons--as needed for any drainage or bleeding.  Keep all follow-up visits as directed by your health care provider. This is important. PREVENTION Take these steps to help prevent a Bartholin cyst from returning:  Practice good hygiene.   Clean your vaginal area with mild soap and a soft cloth when you bathe.  Practice safe sex to prevent  STDs. SEEK MEDICAL CARE IF:  You have increased pain, swelling, or redness in the area of the cyst.  Puslike drainage is coming from the cyst.  You have a fever.   This information is not intended to replace advice given to you by your health care provider. Make sure you discuss any questions you have with your health care provider.   Document Released: 12/15/2005 Document Revised: 01/05/2015 Document Reviewed: 07/31/2014 Elsevier Interactive Patient Education 2016 Elsevier Inc.  

## 2016-11-03 NOTE — MAU Note (Signed)
Pt has felt a tender area on her right labia this past Friday and has increased in size since then. Pt has taken hot baths and the site was draining but has closed and filled up again.

## 2016-11-28 ENCOUNTER — Telehealth (INDEPENDENT_AMBULATORY_CARE_PROVIDER_SITE_OTHER): Payer: Self-pay | Admitting: *Deleted

## 2016-11-28 NOTE — Telephone Encounter (Signed)
I left voicemail for patient. I asked for return call next week in regards to what she is feeling, so that we can see if she needs to be added to Dr. Ophelia CharterYates schedule.

## 2016-11-28 NOTE — Telephone Encounter (Signed)
Pt called stating the plate in her ankle feels like it is moving, pt requesting call back

## 2016-12-10 ENCOUNTER — Encounter (INDEPENDENT_AMBULATORY_CARE_PROVIDER_SITE_OTHER): Payer: Self-pay | Admitting: Orthopedic Surgery

## 2016-12-10 ENCOUNTER — Ambulatory Visit (INDEPENDENT_AMBULATORY_CARE_PROVIDER_SITE_OTHER): Payer: Medicaid Other | Admitting: Orthopedic Surgery

## 2016-12-10 ENCOUNTER — Encounter (INDEPENDENT_AMBULATORY_CARE_PROVIDER_SITE_OTHER): Payer: Self-pay

## 2016-12-10 ENCOUNTER — Ambulatory Visit (INDEPENDENT_AMBULATORY_CARE_PROVIDER_SITE_OTHER): Payer: Medicaid Other

## 2016-12-10 DIAGNOSIS — Z8781 Personal history of (healed) traumatic fracture: Secondary | ICD-10-CM

## 2016-12-10 DIAGNOSIS — Z967 Presence of other bone and tendon implants: Secondary | ICD-10-CM | POA: Diagnosis not present

## 2016-12-10 DIAGNOSIS — Z9889 Other specified postprocedural states: Secondary | ICD-10-CM

## 2016-12-10 DIAGNOSIS — G8929 Other chronic pain: Secondary | ICD-10-CM | POA: Insufficient documentation

## 2016-12-10 DIAGNOSIS — M25572 Pain in left ankle and joints of left foot: Secondary | ICD-10-CM

## 2016-12-10 MED ORDER — DICLOFENAC SODIUM 1 % TD GEL: 2.0000 g | Freq: Four times a day (QID) | TRANSDERMAL | 3 refills | Status: DC

## 2016-12-12 NOTE — Progress Notes (Signed)
Office Visit Note   Patient: Kaylee Simpson           Date of Birth: 11/28/1976           MRN: [id number] Visit Date: 12/10/2016 Requested by: Fleet ContrasEdwin Avbuere, MD [address] [place], [place] [id number] PCP: Dorrene GermanEdwin A Avbuere, MD  Subjective: Chief Complaint  Patient presents with  . Left Ankle - Pain    HPI Kaylee Simpson is a 40 year old patient with left ankle pain.  She underwent fracture fixation for 1917 with syndesmotic screw removal 08/08/2016.  She feels like one of the screws is poking out more.  She states that the incision is tender to touch always.  Taking extra strength Tylenol for the problem.  She is a smoker.              Review of Systems All systems reviewed are negative as they relate to the chief complaint within the history of present illness.  Patient denies  fevers or chills.    Assessment & Plan: Visit Diagnoses:  1. Chronic pain of left ankle   2. Status post open reduction with internal fixation (ORIF) of fracture of ankle     Plan: Impression symptomatic hardware left ankle with radiographic evidence of fracture healing.  The patient may need to get that plate removed because she is very thin around the ankle and this is likely symptomatic in that region.  She will see Dr. Ophelia CharterYates if she wants to proceed with that surgical option follow up with me as needed  Follow-Up Instructions: Return if symptoms worsen or fail to improve.   Orders:  Orders Placed This Encounter  Procedures  . XR Ankle Complete Left   Meds ordered this encounter  Medications  . diclofenac sodium (VOLTAREN) 1 % GEL    Sig: Apply 2 g topically 4 (four) times daily.    Dispense:  1 Tube    Refill:  3      Procedures: No procedures performed   Clinical Data: No additional findings.  Objective: Vital Signs: There were no vitals taken for this visit.  Physical Exam left ankle demonstrates palpable pedal pulses intact sensation on the dorsal and plantar aspect of the foot palpable  intact nontender anterior to posterior tib peroneal and Achilles tendon.  Symmetric tibiotalar subtalar transverse tarsal range of motion is noted.  She does have tenderness to direct palpation over the plate.  There is no warmth or swelling over the plate  Constitutional: Patient appears well-developed HEENT:  Head: Normocephalic Eyes:EOM are normal Neck: Normal range of motion Cardiovascular: Normal rate Pulmonary/chest: Effort normal Neurologic: Patient is alert Skin: Skin is warm Psychiatric: Patient has normal mood and affect    Ortho Exam  Specialty Comments:  No specialty comments available.  Imaging: No results found.   PMFS History: Patient Active Problem List   Diagnosis Date Noted  . Chronic pain of left ankle 12/10/2016  . Status post open reduction with internal fixation (ORIF) of fracture of ankle 12/10/2016  . Status post ORIF of fracture of ankle 04/16/2016  . Anemia 10/08/2012  . Pyelonephritis 10/05/2012  . Hypokalemia 10/05/2012  . Hyponatremia 10/05/2012  . Tachycardia 10/05/2012  . SIRS (systemic inflammatory response syndrome) (HCC) 10/05/2012  . Acute renal insufficiency 10/05/2012   Past Medical History:  Diagnosis Date  . Complication of anesthesia   . Hypertension    not a problem  . PONV (postoperative nausea and vomiting)   . Pyelonephritis "several times"    No  family history on file.  Past Surgical History:  Procedure Laterality Date  . DILATION AND CURETTAGE OF UTERUS  1997; 1998  . FRACTURE SURGERY    . ORIF ANKLE FRACTURE Left 04/16/2016   Procedure: OPEN REDUCTION INTERNAL FIXATION (ORIF) LEFT BIMALLEOLAR ANKLE FRACTURE WITH SYNDESMOSIS SCREW;  Surgeon: Eldred MangesMark C Yates, MD;  Location: [facility] OR;  Service: Orthopedics;  Laterality: Left;  . ORIF ANKLE FRACTURE BIMALLEOLAR Left 04/16/2016   ORIF syndesmosis.  Marland Kitchen. PERCUTANEOUS PINNING TOE FRACTURE Right 2011   great toe and middle   Social History   Occupational History  . Not on file.     Social History Main Topics  . Smoking status: Current Every Day Smoker    Packs/day: 0.25    Years: 10.00    Types: Cigarettes  . Smokeless tobacco: Current User  . Alcohol use 3.6 oz/week    6 Cans of beer per week     Comment: Social  . Drug use: No  . Sexual activity: Yes

## 2017-11-13 ENCOUNTER — Other Ambulatory Visit: Payer: Self-pay

## 2017-11-13 ENCOUNTER — Encounter (HOSPITAL_COMMUNITY): Payer: Self-pay | Admitting: *Deleted

## 2017-11-13 ENCOUNTER — Inpatient Hospital Stay (HOSPITAL_COMMUNITY)
Admission: AD | Admit: 2017-11-13 | Discharge: 2017-11-13 | Disposition: A | Discharge status: Home / Self Care | Payer: Medicaid Other | Source: Ambulatory Visit | Attending: Obstetrics and Gynecology | Admitting: Obstetrics and Gynecology

## 2017-11-13 DIAGNOSIS — Z79899 Other long term (current) drug therapy: Secondary | ICD-10-CM | POA: Insufficient documentation

## 2017-11-13 DIAGNOSIS — N751 Abscess of Bartholin's gland: Secondary | ICD-10-CM | POA: Insufficient documentation

## 2017-11-13 DIAGNOSIS — Z9889 Other specified postprocedural states: Secondary | ICD-10-CM | POA: Insufficient documentation

## 2017-11-13 DIAGNOSIS — I10 Essential (primary) hypertension: Secondary | ICD-10-CM | POA: Diagnosis not present

## 2017-11-13 DIAGNOSIS — Z0189 Encounter for other specified special examinations: Secondary | ICD-10-CM

## 2017-11-13 DIAGNOSIS — F17210 Nicotine dependence, cigarettes, uncomplicated: Secondary | ICD-10-CM | POA: Insufficient documentation

## 2017-11-13 DIAGNOSIS — R102 Pelvic and perineal pain: Secondary | ICD-10-CM | POA: Diagnosis present

## 2017-11-13 DIAGNOSIS — Z7689 Persons encountering health services in other specified circumstances: Secondary | ICD-10-CM

## 2017-11-13 LAB — URINALYSIS, ROUTINE W REFLEX MICROSCOPIC
BACTERIA UA: NONE SEEN
Bilirubin Urine: NEGATIVE
Glucose, UA: NEGATIVE mg/dL
Ketones, ur: 20 mg/dL — AB
Leukocytes, UA: NEGATIVE
NITRITE: NEGATIVE
Protein, ur: 30 mg/dL — AB
SPECIFIC GRAVITY, URINE: 1.03 (ref 1.005–1.030)
pH: 5 (ref 5.0–8.0)

## 2017-11-13 LAB — POCT PREGNANCY, URINE: Preg Test, Ur: NEGATIVE

## 2017-11-13 MED ORDER — HYDROCODONE-ACETAMINOPHEN 5-325 MG PO TABS
2.0000 | ORAL_TABLET | Freq: Once | ORAL | Status: AC
  Administered 2017-11-13: 2 via ORAL
  Filled 2017-11-13 (×2): qty 2

## 2017-11-13 MED ORDER — SULFAMETHOXAZOLE-TRIMETHOPRIM 800-160 MG PO TABS: 1.0000 | ORAL_TABLET | Freq: Two times a day (BID) | ORAL | 0 refills | Status: AC

## 2017-11-13 MED ORDER — LIDOCAINE HCL (PF) 1 % IJ SOLN
INTRAMUSCULAR | Status: AC
  Filled 2017-11-13: qty 30

## 2017-11-13 MED ORDER — IBUPROFEN 800 MG PO TABS: 800.0000 mg | ORAL_TABLET | Freq: Three times a day (TID) | ORAL | 0 refills | Status: DC | PRN

## 2017-11-13 NOTE — MAU Note (Signed)
Has been having cold symptoms for about 2 wks, now has chest pain/tightness.  Has been doing a lot of coughing ,started having pain in her groin on Tues- thought it was from the coughing. fever yesterday 102.1, ear pain, no real sore throat. Noticed a bartholin's cyst last night.  Has had one years ago.  Soaked in a tub of hot water last night, it getting bigger and bigger- has started draining.

## 2017-11-13 NOTE — Discharge Instructions (Signed)
In late 2019, [facility] be [facility]. At that time, the MAU (Maternity Admissions Unit), where you are being seen today, will no longer see non-pregnant patients. We strongly encourage you to find a doctor's office before that time, so that you can be seen with any GYN concerns, like vaginal discharge, urinary tract infection, etc.. in a timely manner.   In order to make the office visit more convenient, the [facility] will be offering evening hours from 4pm-7:30pm on Monday. There will be same-day appointments, walk-in appointments and scheduled appointments available during this time. We will be adding more evening hours over the next year before the move.   [facility] [phone]- [phone]  For urgent needs, [facility] is also available for management of urgent GYN complaints such as vaginal discharge or urinary tract infections.     Primary care follow up  [facility] (will see you even if you do not have sickle cell): [phone] [facility]: [phone] [facility]: [phone]    Bartholin Cyst or Abscess A Bartholin cyst is a fluid-filled sac that forms on a Bartholin gland. Bartholin glands are small glands that are located within the folds of skin (labia) along the sides of the lower opening of the vagina. These glands produce a fluid to moisten the outside of the vagina during sexual intercourse. A Bartholin cyst causes a bulge on the side of the vagina. A cyst that is not large or infected may not cause symptoms or problems. However, if the fluid within the cyst becomes infected, the cyst can turn into an abscess. An abscess may cause discomfort or pain. What are the causes? A Bartholin cyst may develop when the duct of the gland becomes blocked. In many cases, the cause of this is not known. Various kinds of bacteria can  cause the cyst to become infected and develop into an abscess. What increases the risk? You may be at an increased risk of developing a Bartholin cyst or abscess if:  You are a woman of reproductive age.  You have a history of previous Bartholin cysts or abscesses.  You have diabetes.  You have a sexually transmitted disease (STD).  What are the signs or symptoms? The severity of symptoms varies depending on the size of the cyst and whether it is infected. Symptoms may include:  A bulge or swelling near the lower opening of your vagina.  Discomfort or pain.  Redness.  Pain during sexual intercourse.  Pain when walking.  Fluid draining from the area.  How is this diagnosed? Your health care provider may make a diagnosis based on your symptoms and a physical exam. He or she will look for swelling in your vaginal area. Blood tests may be done to check for infections. A sample of fluid from the cyst or abscess may also be taken to be tested in a lab. How is this treated? Small cysts that are not infected may not require any treatment. These often go away on their own. Yourhealth care provider will recommend hot baths and the use of warm compresses. These may also be part of the treatment for an abscess. Treatment options for a large cyst or abscess may include:  Antibiotic medicine.  A surgical procedure to drain the abscess. One of the following procedures may be done: ? Incision and drainage. An incision is made in the  cyst or abscess so that the fluid drains out. A catheter may be placed inside the cyst so that it does not close and fill up with fluid again. The catheter will be removed after you have a follow-up visit with a specialist (gynecologist). ? Marsupialization. The cyst or abscess is opened and kept open by stitching the edges of the skin to the walls of the cyst or abscess. This allows it to continue to drain and not fill up with fluid again.  If you have cysts or  abscesses that keep returning and have required incision and drainage multiple times, your health care provider may talk to you about surgery to remove the Bartholin gland. Follow these instructions at home:  Take medicines only as directed by your health care provider.  If you were prescribed an antibiotic medicine, finish it all even if you start to feel better.  Apply warm, wet compresses to the area or take warm, shallow baths that cover your pelvic region (sitz baths) several times a day or as directed by your health care provider.  Do not squeeze the cyst or apply heavy pressure to it.  Do not have sexual intercourse until the cyst has gone away.  If your cyst or abscess was opened, a small piece of gauze or a drain may have been placed in the area to allow drainage. Do not remove the gauze or the drain until directed by your health care provider.  Wear feminine pads--not tampons--as needed for any drainage or bleeding.  Keep all follow-up visits as directed by your health care provider. This is important. How is this prevented? Take these steps to help prevent a Bartholin cyst from returning:  Practice good hygiene.  Clean your vaginal area with mild soap and a soft cloth when you bathe.  Practice safe sex to prevent STDs.  Contact a health care provider if:  You have increased pain, swelling, or redness in the area of the cyst.  Puslike drainage is coming from the cyst.  You have a fever. This information is not intended to replace advice given to you by your health care provider. Make sure you discuss any questions you have with your health care provider. Document Released: 12/15/2005 Document Revised: 05/22/2016 Document Reviewed: 07/31/2014 Elsevier Interactive Patient Education  2018 [employer]

## 2017-11-13 NOTE — MAU Provider Note (Signed)
History     CSN: [id number]  Arrival date and time: 11/13/17 1151   First Provider Initiated Contact with Patient 11/13/17 1411      Chief Complaint  Patient presents with  . Bartholin's Cyst   Pelvic Pain  The patient's primary symptoms include genital lesions. The patient's pertinent negatives include no pelvic pain. This is a new problem. The current episode started yesterday. The problem occurs constantly. The problem has been gradually improving. Pain severity now: 9/10. The problem affects the left side. She is not pregnant. The vaginal discharge was brown (brown drainage from abscess ). There has been no bleeding. Nothing aggravates the symptoms. She has tried acetaminophen for the symptoms. The treatment provided no relief. Her menstrual history has been irregular (LMP approx 2 months ago).    Past Medical History:  Diagnosis Date  . Complication of anesthesia   . Hypertension    not a problem  . PONV (postoperative nausea and vomiting)   . Pyelonephritis "several times"    Past Surgical History:  Procedure Laterality Date  . DILATION AND CURETTAGE OF UTERUS  1997; 1998  . FRACTURE SURGERY    . OPEN REDUCTION INTERNAL FIXATION (ORIF) LEFT BIMALLEOLAR ANKLE FRACTURE WITH SYNDESMOSIS SCREW Left 04/16/2016   Performed by Eldred MangesYates, Mark C, [facility]  . ORIF ANKLE FRACTURE BIMALLEOLAR Left 04/16/2016   ORIF syndesmosis.  Marland Kitchen. PERCUTANEOUS PINNING TOE FRACTURE Right 2011   great toe and middle    History reviewed. No pertinent family history.  Social History   Tobacco Use  . Smoking status: Current Every Day Smoker    Packs/day: 0.25    Years: 10.00    Pack years: 2.50    Types: Cigarettes  . Smokeless tobacco: Current User  Substance Use Topics  . Alcohol use: Yes    Alcohol/week: 3.6 oz    Types: 6 Cans of beer per week    Comment: Social  . Drug use: No    Allergies: No Known Allergies  Medications Prior to Admission  Medication Sig Dispense Refill Last  Dose  . acetaminophen (TYLENOL) 500 MG tablet Take 500 mg by mouth every 6 (six) hours as needed for mild pain.    11/13/2017 at Unknown time  . ibuprofen (ADVIL,MOTRIN) 200 MG tablet Take 600 mg every 6 (six) hours as needed by mouth for headache or moderate pain.   11/13/2017 at Unknown time  . diclofenac sodium (VOLTAREN) 1 % GEL Apply 2 g topically 4 (four) times daily. (Patient taking differently: Apply 2 g 4 (four) times daily as needed topically. ) 1 Tube 3 11/10/2017  . Misc. Devices (FREE SPIRIT KNEE/LEG WALKER) MISC 1 Device by Does not apply route once. (Patient not taking: Reported on 12/10/2016) 1 each 0 Not Taking  . oxyCODONE-acetaminophen (PERCOCET/ROXICET) 5-325 MG tablet Take 1-2 tablets by mouth every 6 (six) hours as needed for severe pain. (Patient not taking: Reported on 12/10/2016) 60 tablet 0 Completed Course at Unknown time    Review of Systems  Genitourinary: Negative for pelvic pain.   Physical Exam   Blood pressure (!) 138/96, pulse 79, temperature 98.1 F (36.7 C), temperature source Oral, resp. rate 18, weight 166 lb 8 oz (75.5 kg), SpO2 100 %.  Physical Exam  Nursing note and vitals reviewed. Constitutional: She is oriented to person, place, and time. She appears well-developed and well-nourished. No distress.  HENT:  Head: Normocephalic.  Cardiovascular: Normal rate.  Respiratory: Effort normal.  GI: Soft. There is no  tenderness. There is no rebound.  Genitourinary:  Genitourinary Comments: Large fluctuant bartholin's abscess.   Neurological: She is alert and oriented to person, place, and time.  Skin: Skin is warm and dry.  Psychiatric: She has a normal mood and affect.   Results for orders placed or performed during the hospital encounter of 11/13/17 (from the past 24 hour(s))  Urinalysis, Routine w reflex microscopic     Status: Abnormal   Collection Time: 11/13/17 12:09 PM  Result Value Ref Range   Color, Urine YELLOW YELLOW   APPearance HAZY (A)  CLEAR   Specific Gravity, Urine 1.030 1.005 - 1.030   pH 5.0 5.0 - 8.0   Glucose, UA NEGATIVE NEGATIVE mg/dL   Hgb urine dipstick SMALL (A) NEGATIVE   Bilirubin Urine NEGATIVE NEGATIVE   Ketones, ur 20 (A) NEGATIVE mg/dL   Protein, ur 30 (A) NEGATIVE mg/dL   Nitrite NEGATIVE NEGATIVE   Leukocytes, UA NEGATIVE NEGATIVE   RBC / HPF 0-5 0 - 5 RBC/hpf   WBC, UA 0-5 0 - 5 WBC/hpf   Bacteria, UA NONE SEEN NONE SEEN   Squamous Epithelial / LPF 0-5 (A) NONE SEEN   Mucus PRESENT   Pregnancy, urine POC     Status: None   Collection Time: 11/13/17 12:17 PM  Result Value Ref Range   Preg Test, Ur NEGATIVE NEGATIVE    MAU Course  Procedures  MDM Time out done, verified name and DOB, informed consent obtained  Bartholin Cyst I&D and Word Catheter Placement  Enlarged abscess palpated in front of the hymenal ring around 5 or 7 o' clock.  Written informed consent was obtained.  Discussed complications and possible outcomes of procedure including recurrence of cyst, scarring leading to infection, bleeding, dyspareunia, distortion of anatomy.  Patient was examined in the dorsal lithotomy position and mass was identified.  The area was prepped with Iodine and draped in a sterile manner. 1% Lidocaine (3 ml) was then used to infiltrate area on top of the cyst, behind the hymenal ring.  A 7 mm incision was made using a sterile scapel. Upon palpation of the mass, a moderate amount of bloody purulent drainage was expressed through the incision. A hemostat was used to break up loculations, which resulted in expression of more bloody purulent drainage.  Samples of the drainage were sent for cultures. The open cyst was then copiously irrigated with normal saline, and a Word catheter was placed. 1.5 ml of sterile water was used to inflate the catheter balloon.  The end of the catheter was tucked into the vagina.  Patient tolerated the procedure well, reported feeling " a lot better." - Bactrim DS bid x 7 days for  treatment  - Recommended Sitz baths bid and Motrin was given  prn pain.   She was told to call to be examined if she experiences increasing swelling, pain, vaginal discharge, or fever.  - She was instructed to wear a peripad to absorb discharge, and to maintain pelvic rest while the Word catheter is in place.  -The catheter will be left in place for at least two to four weeks to promote formation of an epithelialized tract for permanent drainage of glandular secretions.  - She [facility] now for removal of word catheter.    Assessment and Plan   1. Bartholin's gland abscess   2. Encounter for incision and drainage procedure    DC home Comfort measures reviewed  RX: Bactrim  DS x 7 days, Ibuprofen 800mg  PRN #30  Return to MAU as needed   Follow-up Information    [facility]   Specialty:  Obstetrics and Gynecology Contact information: [address] [place] [id number] [phone]           Thressa ShellerHeather Dalisha Shively 11/13/2017, 2:11 PM

## 2017-11-16 LAB — GC/CHLAMYDIA PROBE AMP (~~LOC~~) NOT AT ARMC
CHLAMYDIA, DNA PROBE: NEGATIVE
NEISSERIA GONORRHEA: NEGATIVE

## 2018-05-11 ENCOUNTER — Ambulatory Visit (INDEPENDENT_AMBULATORY_CARE_PROVIDER_SITE_OTHER): Payer: Medicaid Other | Admitting: Orthopaedic Surgery

## 2018-05-11 ENCOUNTER — Ambulatory Visit (INDEPENDENT_AMBULATORY_CARE_PROVIDER_SITE_OTHER): Payer: Medicaid Other

## 2018-05-11 VITALS — BP 158/110 | HR 80 | Ht 66.0 in | Wt 166.0 lb

## 2018-05-11 DIAGNOSIS — M7062 Trochanteric bursitis, left hip: Secondary | ICD-10-CM | POA: Diagnosis not present

## 2018-05-11 DIAGNOSIS — M25572 Pain in left ankle and joints of left foot: Secondary | ICD-10-CM

## 2018-05-11 NOTE — Progress Notes (Signed)
Office Visit Note   Patient: Kaylee Simpson           Date of Birth: March 04, 1976           MRN: [id number] Visit Date: 05/11/2018              Requested by: Fleet Contras, MD [address] [place], [place] [address] PCP: Fleet Contras, MD   Assessment & Plan: Visit Diagnoses:  1. Pain in left ankle and joints of left foot   2. Trochanteric bursitis, left hip     Plan: Injection performed with good improvement in her pain.  X-ray results of her ankle were reviewed.  She has increased problems with her ankle.  She ambulated much more comfortably after the trochanteric injection in office follow-up PRN.  Follow-Up Instructions: Return if symptoms worsen or fail to improve.   Orders:  Orders Placed This Encounter  Procedures  . Large Joint Inj: L greater trochanter  . XR Ankle Complete Left   No orders of the defined types were placed in this encounter.     Procedures: Large Joint Inj: L greater trochanter on 05/11/2018 2:17 PM Details: lateral approach Medications: 0.5 mL lidocaine 1 %; 2 mL bupivacaine 0.25 %; 40 mg methylPREDNISolone acetate 40 MG/ML      Clinical Data: No additional findings.   Subjective: Chief Complaint  Patient presents with  . Left Ankle - Pain    HPI 42 year old female had left lateral malleolar ORIF with syndesmotic screw placement in April 2017.  She later had syndesmotic screw removal.  She has had pain laterally in her left hip and states she feels like the lateral aspect of her ankle she notes a palpable screws turned there is a loose screw or problem with 1 of the screws.  And ambulating at times with a limp.  She denies chills or fever.  The drainage from the lateral incision.  Review of Systems positive for history of pyelonephritis, renal insufficiency, ankle fracture left April 2017 otherwise negative as it pertains HPI.   Objective: Vital Signs: BP (!) 158/110   Pulse 80   Ht  (1.676 m)   Wt 166 lb (75.3 kg)   BMI  26.79 kg/m   Physical Exam  Constitutional: She is oriented to person, place, and time. She appears well-developed.  HENT:  Head: Normocephalic.  Right Ear: External ear normal.  Left Ear: External ear normal.  Eyes: Pupils are equal, round, and reactive to light.  Neck: No tracheal deviation present. No thyromegaly present.  Cardiovascular: Normal rate.  Pulmonary/Chest: Effort normal.  Abdominal: Soft.  Neurological: She is alert and oriented to person, place, and time.  Skin: Skin is warm and dry.  Psychiatric: She has a normal mood and affect. Her behavior is normal.    Ortho Exam patient has negative straight leg raising normal logroll of the hip.  She has exquisite tenderness over left trochanter reproduces her pain that radiates laterally down past her knee.  No swelling in the knee ligamentous exam of the knee is normal.  Good range of motion of the ankle.  Lateral ankle incision is well-healed.  Negative anterior drawer.  No ankle effusion no tenderness over the deltoid.Marland Kitchen  Fascia is normal.  Specialty Comments:  No specialty comments available.  Imaging: Three-view x-rays left ankle were obtained and reviewed.  His previous lateral malleolar fixation with healing of the fracture.  No posttraumatic ankle degenerative changes are noted.   Mortise is well reduced.  PMFS History: Patient Active Problem List   Diagnosis Date Noted  . Trochanteric bursitis, left hip 05/24/2018  . Pain in left ankle and joints of left foot 05/24/2018  . Chronic pain of left ankle 12/10/2016  . Status post open reduction with internal fixation (ORIF) of fracture of ankle 12/10/2016  . Status post ORIF of fracture of ankle 04/16/2016  . Anemia 10/08/2012  . Pyelonephritis 10/05/2012  . Hypokalemia 10/05/2012  . Hyponatremia 10/05/2012  . Tachycardia 10/05/2012  . SIRS (systemic inflammatory response syndrome) (HCC) 10/05/2012  . Acute renal insufficiency 10/05/2012   Past Medical  History:  Diagnosis Date  . Complication of anesthesia   . Hypertension    not a problem  . PONV (postoperative nausea and vomiting)   . Pyelonephritis "several times"    No family history on file.  Past Surgical History:  Procedure Laterality Date  . DILATION AND CURETTAGE OF UTERUS  1997; 1998  . FRACTURE SURGERY    . ORIF ANKLE FRACTURE Left 04/16/2016   Procedure: OPEN REDUCTION INTERNAL FIXATION (ORIF) LEFT BIMALLEOLAR ANKLE FRACTURE WITH SYNDESMOSIS SCREW;  Surgeon: Eldred Manges, MD;  Location: [facility] OR;  Service: Orthopedics;  Laterality: Left;  . ORIF ANKLE FRACTURE BIMALLEOLAR Left 04/16/2016   ORIF syndesmosis.  Marland Kitchen PERCUTANEOUS PINNING TOE FRACTURE Right 2011   great toe and middle   Social History   Occupational History  . Not on file  Tobacco Use  . Smoking status: Current Every Day Smoker    Packs/day: 0.25    Years: 10.00    Pack years: 2.50    Types: Cigarettes  . Smokeless tobacco: Current User  Substance and Sexual Activity  . Alcohol use: Yes    Alcohol/week: 3.6 oz    Types: 6 Cans of beer per week    Comment: Social  . Drug use: No  . Sexual activity: Yes

## 2018-05-24 ENCOUNTER — Encounter (INDEPENDENT_AMBULATORY_CARE_PROVIDER_SITE_OTHER): Payer: Self-pay | Admitting: Orthopaedic Surgery

## 2018-05-24 DIAGNOSIS — M25572 Pain in left ankle and joints of left foot: Secondary | ICD-10-CM | POA: Insufficient documentation

## 2018-05-24 DIAGNOSIS — M7062 Trochanteric bursitis, left hip: Secondary | ICD-10-CM | POA: Insufficient documentation

## 2018-05-24 MED ORDER — METHYLPREDNISOLONE ACETATE 40 MG/ML IJ SUSP
40.0000 mg | INTRAMUSCULAR | Status: AC | PRN
  Administered 2018-05-11: 40 mg via INTRA_ARTICULAR

## 2018-05-24 MED ORDER — LIDOCAINE HCL 1 % IJ SOLN
0.5000 mL | INTRAMUSCULAR | Status: AC | PRN
  Administered 2018-05-11: .5 mL

## 2018-05-24 MED ORDER — BUPIVACAINE HCL 0.25 % IJ SOLN
2.0000 mL | INTRAMUSCULAR | Status: AC | PRN
  Administered 2018-05-11: 2 mL via INTRA_ARTICULAR

## 2018-07-09 LAB — GLUCOSE, POCT (MANUAL RESULT ENTRY): POC Glucose: 126 mg/dl — AB (ref 70–99)

## 2018-09-07 ENCOUNTER — Other Ambulatory Visit: Payer: Self-pay

## 2018-09-07 ENCOUNTER — Emergency Department (HOSPITAL_COMMUNITY): Payer: Medicaid Other

## 2018-09-07 ENCOUNTER — Encounter (HOSPITAL_COMMUNITY): Payer: Self-pay | Admitting: Emergency Medicine

## 2018-09-07 ENCOUNTER — Emergency Department (HOSPITAL_COMMUNITY)
Admission: EM | Admit: 2018-09-07 | Discharge: 2018-09-07 | Disposition: A | Discharge status: Home / Self Care | Payer: Medicaid Other | Attending: Emergency Medicine | Admitting: Emergency Medicine

## 2018-09-07 DIAGNOSIS — R1011 Right upper quadrant pain: Secondary | ICD-10-CM | POA: Diagnosis present

## 2018-09-07 DIAGNOSIS — K529 Noninfective gastroenteritis and colitis, unspecified: Secondary | ICD-10-CM | POA: Diagnosis not present

## 2018-09-07 DIAGNOSIS — I10 Essential (primary) hypertension: Secondary | ICD-10-CM | POA: Insufficient documentation

## 2018-09-07 DIAGNOSIS — Z79899 Other long term (current) drug therapy: Secondary | ICD-10-CM | POA: Insufficient documentation

## 2018-09-07 DIAGNOSIS — F17210 Nicotine dependence, cigarettes, uncomplicated: Secondary | ICD-10-CM | POA: Diagnosis not present

## 2018-09-07 LAB — URINALYSIS, ROUTINE W REFLEX MICROSCOPIC
BACTERIA UA: NONE SEEN
Bilirubin Urine: NEGATIVE
Glucose, UA: NEGATIVE mg/dL
Ketones, ur: NEGATIVE mg/dL
Leukocytes, UA: NEGATIVE
Nitrite: NEGATIVE
Protein, ur: NEGATIVE mg/dL
SPECIFIC GRAVITY, URINE: 1.01 (ref 1.005–1.030)
pH: 6 (ref 5.0–8.0)

## 2018-09-07 LAB — COMPREHENSIVE METABOLIC PANEL
ALT: 27 U/L (ref 0–44)
AST: 34 U/L (ref 15–41)
Albumin: 3.5 g/dL (ref 3.5–5.0)
Alkaline Phosphatase: 66 U/L (ref 38–126)
Anion gap: 13 (ref 5–15)
BILIRUBIN TOTAL: 0.4 mg/dL (ref 0.3–1.2)
BUN: 6 mg/dL (ref 6–20)
CHLORIDE: 108 mmol/L (ref 98–111)
CO2: 24 mmol/L (ref 22–32)
Calcium: 8.7 mg/dL — ABNORMAL LOW (ref 8.9–10.3)
Creatinine, Ser: 0.59 mg/dL (ref 0.44–1.00)
GFR calc Af Amer: >60 mL/min (ref >60)
GFR calc non Af Amer: >60 mL/min (ref >60)
GLUCOSE: 99 mg/dL (ref 70–99)
POTASSIUM: 3.8 mmol/L (ref 3.5–5.1)
Sodium: 145 mmol/L (ref 135–145)
TOTAL PROTEIN: 7.2 g/dL (ref 6.5–8.1)

## 2018-09-07 LAB — CBC
HEMATOCRIT: 41.8 % (ref 36.0–46.0)
Hemoglobin: 13.6 g/dL (ref 12.0–15.0)
MCH: 32 pg (ref 26.0–34.0)
MCHC: 32.5 g/dL (ref 30.0–36.0)
MCV: 98.4 fL (ref 78.0–100.0)
PLATELETS: 279 10*3/uL (ref 150–400)
RBC: 4.25 MIL/uL (ref 3.87–5.11)
RDW: 14 % (ref 11.5–15.5)
WBC: 6.4 10*3/uL (ref 4.0–10.5)

## 2018-09-07 LAB — I-STAT BETA HCG BLOOD, ED (MC, WL, AP ONLY)

## 2018-09-07 LAB — LIPASE, BLOOD: Lipase: 23 U/L (ref 11–51)

## 2018-09-07 MED ORDER — ONDANSETRON HCL 4 MG/2ML IJ SOLN
4.0000 mg | Freq: Once | INTRAMUSCULAR | Status: AC
  Administered 2018-09-07: 4 mg via INTRAVENOUS
  Filled 2018-09-07: qty 2

## 2018-09-07 MED ORDER — IOPAMIDOL (ISOVUE-300) INJECTION 61%
100.0000 mL | Freq: Once | INTRAVENOUS | Status: AC | PRN
  Administered 2018-09-07: 100 mL via INTRAVENOUS

## 2018-09-07 MED ORDER — LACTATED RINGERS IV BOLUS
1000.0000 mL | Freq: Once | INTRAVENOUS | Status: AC
  Administered 2018-09-07: 1000 mL via INTRAVENOUS

## 2018-09-07 MED ORDER — FENTANYL CITRATE (PF) 100 MCG/2ML IJ SOLN
50.0000 ug | Freq: Once | INTRAMUSCULAR | Status: AC
  Administered 2018-09-07: 50 ug via INTRAVENOUS
  Filled 2018-09-07: qty 2

## 2018-09-07 MED ORDER — ONDANSETRON HCL 4 MG PO TABS: 4.0000 mg | ORAL_TABLET | Freq: Four times a day (QID) | ORAL | 0 refills | Status: AC

## 2018-09-07 MED ORDER — IOPAMIDOL (ISOVUE-300) INJECTION 61%
INTRAVENOUS | Status: AC
  Filled 2018-09-07: qty 100

## 2018-09-07 NOTE — ED Notes (Signed)
Pt reports continued nausea and return of the pain.  Dr. Lockie Mola made aware.

## 2018-09-07 NOTE — Discharge Instructions (Signed)
Increase oral hydration, keep a bland diet, take nausea medicine as needed.  Return to ED if symptoms worsen including worsening abdominal pain, fever.

## 2018-09-07 NOTE — ED Provider Notes (Signed)
[facility]-EMERGENCY DEPT Provider Note   CSN: [id number] Arrival date & time: 09/07/18  4696     History   Chief Complaint Chief Complaint  Patient presents with  . Abdominal Pain  . Emesis    HPI Kaylee Simpson is a 42 y.o. female.  The history is provided by the patient.  Abdominal Pain   This is a new problem. The current episode started 12 to 24 hours ago. The problem occurs constantly. The problem has not changed since onset.The pain is associated with eating and alcohol use. The pain is located in the epigastric region and RUQ. The quality of the pain is aching and dull. The pain is at a severity of 5/10. The pain is moderate. Associated symptoms include anorexia, nausea, vomiting and frequency. Pertinent negatives include fever, belching, diarrhea, flatus, hematochezia, melena, constipation, dysuria, hematuria, headaches, arthralgias and myalgias. Nothing aggravates the symptoms. Nothing relieves the symptoms. Past workup does not include GI consult or CT scan. Her past medical history does not include PUD or ulcerative colitis.    Past Medical History:  Diagnosis Date  . Complication of anesthesia   . Hypertension    not a problem  . PONV (postoperative nausea and vomiting)   . Pyelonephritis "several times"    Patient Active Problem List   Diagnosis Date Noted  . Trochanteric bursitis, left hip 05/24/2018  . Pain in left ankle and joints of left foot 05/24/2018  . Chronic pain of left ankle 12/10/2016  . Status post open reduction with internal fixation (ORIF) of fracture of ankle 12/10/2016  . Status post ORIF of fracture of ankle 04/16/2016  . Anemia 10/08/2012  . Pyelonephritis 10/05/2012  . Hypokalemia 10/05/2012  . Hyponatremia 10/05/2012  . Tachycardia 10/05/2012  . SIRS (systemic inflammatory response syndrome) (HCC) 10/05/2012  . Acute renal insufficiency 10/05/2012    Past Surgical History:  Procedure Laterality Date  .  DILATION AND CURETTAGE OF UTERUS  1997; 1998  . FRACTURE SURGERY    . ORIF ANKLE FRACTURE Left 04/16/2016   Procedure: OPEN REDUCTION INTERNAL FIXATION (ORIF) LEFT BIMALLEOLAR ANKLE FRACTURE WITH SYNDESMOSIS SCREW;  Surgeon: Eldred Manges, MD;  Location: [facility] OR;  Service: Orthopedics;  Laterality: Left;  . ORIF ANKLE FRACTURE BIMALLEOLAR Left 04/16/2016   ORIF syndesmosis.  Marland Kitchen PERCUTANEOUS PINNING TOE FRACTURE Right 2011   great toe and middle     OB History    Gravida  6   Para  4   Term      Preterm  4   AB  2   Living  4     SAB      TAB      Ectopic      Multiple      Live Births  4            Home Medications    Prior to Admission medications   Medication Sig Start Date End Date Taking? Authorizing Provider  acetaminophen (TYLENOL) 500 MG tablet Take 1,000 mg by mouth every 6 (six) hours as needed for mild pain.    Yes [provider]  diclofenac sodium (VOLTAREN) 1 % GEL Apply 2 g topically 4 (four) times daily. Patient taking differently: Apply 2 g 4 (four) times daily as needed topically.  12/10/16  Yes Dean, Corrie Mckusick, MD  Menthol-Methyl Salicylate (MUSCLE RUB) 10-15 % CREA Apply 1 application topically 3 (three) times daily as needed for muscle pain.   Yes [provider]  Multiple Vitamins-Minerals (MULTIVITAMIN WOMEN PO) Take 1 tablet by mouth once a week.   Yes [provider]  ibuprofen (ADVIL,MOTRIN) 800 MG tablet Take 1 tablet (800 mg total) every 8 (eight) hours as needed by mouth. Patient not taking: Reported on 09/07/2018 11/13/17   Armando Reichert, CNM  Misc. Devices (FREE SPIRIT KNEE/LEG WALKER) MISC 1 Device by Does not apply route once. Patient not taking: Reported on 12/10/2016 04/11/16   Lyndal Pulley, MD  ondansetron (ZOFRAN) 4 MG tablet Take 1 tablet (4 mg total) by mouth every 6 (six) hours for 12 doses. 09/07/18 09/10/18  Josely Moffat, DO  oxyCODONE-acetaminophen (PERCOCET/ROXICET) 5-325 MG tablet Take 1-2  tablets by mouth every 6 (six) hours as needed for severe pain. Patient not taking: Reported on 12/10/2016 04/17/16   Naida Sleight, PA-C    Family History No family history on file.  Social History Social History   Tobacco Use  . Smoking status: Current Every Day Smoker    Packs/day: 0.25    Years: 10.00    Pack years: 2.50    Types: Cigarettes  . Smokeless tobacco: Current User  Substance Use Topics  . Alcohol use: Yes    Alcohol/week: 6.0 standard drinks    Types: 6 Cans of beer per week    Comment: Social  . Drug use: No     Allergies   Patient has no known allergies.   Review of Systems Review of Systems  Constitutional: Negative for chills and fever.  HENT: Negative for ear pain and sore throat.   Eyes: Negative for pain and visual disturbance.  Respiratory: Negative for cough and shortness of breath.   Cardiovascular: Negative for chest pain and palpitations.  Gastrointestinal: Positive for abdominal pain, anorexia, nausea and vomiting. Negative for constipation, diarrhea, flatus, hematochezia and melena.  Genitourinary: Positive for frequency. Negative for dysuria and hematuria.  Musculoskeletal: Negative for arthralgias, back pain and myalgias.  Skin: Negative for color change and rash.  Neurological: Negative for seizures, syncope and headaches.  All other systems reviewed and are negative.    Physical Exam Updated Vital Signs  ED Triage Vitals  Enc Vitals Group     BP 09/07/18 0736 (!) 128/102     Pulse Rate 09/07/18 0736 (!) 109     Resp 09/07/18 0736 20     Temp 09/07/18 0736 98.7 F (37.1 C)     Temp Source 09/07/18 0736 Oral     SpO2 09/07/18 0736 97 %     Weight 09/07/18 0735 181 lb (82.1 kg)     Height 09/07/18 0735 5\' 6"  (1.676 m)     Head Circumference --      Peak Flow --      Pain Score 09/07/18 0735 10     Pain Loc --      Pain Edu? --      Excl. in GC? --     Physical Exam  Constitutional: She appears well-developed and  well-nourished. She appears distressed.  HENT:  Head: Normocephalic and atraumatic.  Eyes: Pupils are equal, round, and reactive to light. Conjunctivae and EOM are normal.  Neck: Neck supple.  Cardiovascular: Normal rate, regular rhythm, normal heart sounds and intact distal pulses.  No murmur heard. Pulmonary/Chest: Effort normal and breath sounds normal. No respiratory distress.  Abdominal: Soft. There is tenderness in the right upper quadrant and epigastric area. There is no rigidity and no guarding. No hernia.  Musculoskeletal: She exhibits no edema.  Neurological: She  is alert.  Skin: Skin is warm and dry. Capillary refill takes less than 2 seconds.  Psychiatric: She has a normal mood and affect.  Nursing note and vitals reviewed.    ED Treatments / Results  Labs (all labs ordered are listed, but only abnormal results are displayed) Labs Reviewed  COMPREHENSIVE METABOLIC PANEL - Abnormal; Notable for the following components:      Result Value   Calcium 8.7 (*)    All other components within normal limits  URINALYSIS, ROUTINE W REFLEX MICROSCOPIC - Abnormal; Notable for the following components:   Hgb urine dipstick SMALL (*)    All other components within normal limits  LIPASE, BLOOD  CBC  I-STAT BETA HCG BLOOD, ED ([facility], [facility], [facility] ONLY)    EKG None  Radiology Ct Abdomen Pelvis W Contrast  Result Date: 09/07/2018 CLINICAL DATA:  Right upper quadrant pain for 3 days, initial encounter EXAM: CT ABDOMEN AND PELVIS WITH CONTRAST TECHNIQUE: Multidetector CT imaging of the abdomen and pelvis was performed using the standard protocol following bolus administration of intravenous contrast. CONTRAST:  100 mL ISOVUE-300 IOPAMIDOL (ISOVUE-300) INJECTION 61% COMPARISON:  10/07/2012. FINDINGS: Lower chest: Patchy dependent atelectatic changes are seen in the bases bilaterally Hepatobiliary: Mild fatty infiltration of the liver is noted. The gallbladder is within normal limits. Pancreas:  Unremarkable. No pancreatic ductal dilatation or surrounding inflammatory changes. Spleen: Normal in size without focal abnormality. Adrenals/Urinary Tract: Adrenal glands are unremarkable. Kidneys show no renal calculi or obstructive changes. Stable left renal cyst is noted measuring 3.8 cm. Bladder is decompressed. Stomach/Bowel: Scattered diverticular change of the colon is noted without definitive diverticulitis. The appendix is within normal limits. No obstructive changes are noted. Some mild venous engorgement is noted within the small bowel mesentery few scattered lymph nodes are identified. This may be related to a generalized degree of small-bowel enteritis. No abscess is identified. Vascular/Lymphatic: No significant vascular findings are present. No enlarged abdominal or pelvic lymph nodes. Reproductive: Status post hysterectomy. No adnexal masses. Other: No abdominal wall hernia or abnormality. No abdominopelvic ascites. Musculoskeletal: No acute or significant osseous findings. IMPRESSION: Mild venous engorgement and mesenteric inflammatory change with small scattered lymph nodes throughout the mesentery. This may represent a generalized small bowel enteritis or mesenteric adenitis. No obstructive changes are seen. No focal abscess is identified. Chronic changes as described above. Electronically Signed   By: Alcide Clever M.D.   On: 09/07/2018 10:14    Procedures Procedures (including critical care time)  Medications Ordered in ED Medications  lactated ringers bolus 1,000 mL (0 mLs Intravenous Stopped 09/07/18 0948)  ondansetron (ZOFRAN) injection 4 mg (4 mg Intravenous Given 09/07/18 0822)  fentaNYL (SUBLIMAZE) injection 50 mcg (50 mcg Intravenous Given 09/07/18 0832)  iopamidol (ISOVUE-300) 61 % injection 100 mL (100 mLs Intravenous Contrast Given 09/07/18 0942)  ondansetron (ZOFRAN) injection 4 mg (4 mg Intravenous Given 09/07/18 1038)     Initial Impression / Assessment and Plan / ED  Course  I have reviewed the triage vital signs and the nursing notes.  Pertinent labs & imaging results that were available during my care of the patient were reviewed by me and considered in my medical decision making (see chart for details).     Kaylee Simpson is a 42 year old female with no significant medical history who presents to the ED with abdominal pain, emesis, diarrhea.  Patient with normal vitals.  No fever.  Patient with symptoms for the last 2 days.  Pain mostly in epigastric region  in the right upper quadrant.  No peritonitis on exam.  Patient is overall well-appearing.  Denies any urinary symptoms.  No vaginal symptoms.  Appears well-hydrated.  Concern for possible gastritis versus enteritis versus gallbladder/liver pathology.  Basic labs ordered including urinalysis and CT abdomen pelvis performed.  Patient given lactated Ringer bolus and IV Zofran.  IV fentanyl given.  No signs to suggest urinary tract infection.  Negative pregnancy test.  Normal lipase and doubt pancreatitis.  No significant electrolyte abnormality, kidney injury, leukocytosis, anemia.  Gallbladder and liver enzymes within normal limits.  Patient was CT scan that showed enteritis, fairly uncomplicated, no abscess.  Suspect patient likely with viral process.  Patient given additional dose of IV Zofran and feels improved.  Patient would like discharged to home and believe that this is a safe plan.  Recommend increase oral hydration.  Recommend close follow-up with primary care doctor and given strict return precautions including worsening pain, fever.  Patient given prescription for Zofran and discharged from ED in good condition.  This chart was dictated using voice recognition software.  Despite best efforts to proofread,  errors can occur which can change the documentation meaning.   Final Clinical Impressions(s) / ED Diagnoses   Final diagnoses:  Enteritis    ED Discharge Orders         Ordered     ondansetron (ZOFRAN) 4 MG tablet  Every 6 hours     09/07/18 1055           Athea Haley, DO 09/07/18 1058

## 2018-09-07 NOTE — ED Notes (Signed)
Pt in CT.

## 2018-09-07 NOTE — ED Triage Notes (Signed)
Pt c/o RUQ pain since Sunday with n/v.

## 2019-04-20 ENCOUNTER — Other Ambulatory Visit: Payer: Self-pay

## 2019-04-20 ENCOUNTER — Encounter (HOSPITAL_COMMUNITY): Payer: Self-pay

## 2019-04-20 ENCOUNTER — Ambulatory Visit (HOSPITAL_COMMUNITY)
Admission: EM | Admit: 2019-04-20 | Discharge: 2019-04-20 | Disposition: A | Discharge status: Home / Self Care | Payer: Medicaid Other | Attending: Family Medicine | Admitting: Family Medicine

## 2019-04-20 DIAGNOSIS — H1031 Unspecified acute conjunctivitis, right eye: Secondary | ICD-10-CM

## 2019-04-20 MED ORDER — FLUORESCEIN SODIUM 1 MG OP STRP
ORAL_STRIP | OPHTHALMIC | Status: AC
  Filled 2019-04-20: qty 1

## 2019-04-20 MED ORDER — TOBRAMYCIN 0.3 % OP SOLN
OPHTHALMIC | Status: AC
  Filled 2019-04-20: qty 5

## 2019-04-20 MED ORDER — TETRACAINE HCL 0.5 % OP SOLN
OPHTHALMIC | Status: AC
  Filled 2019-04-20: qty 4

## 2019-04-20 MED ORDER — TOBRAMYCIN 0.3 % OP SOLN
2.0000 [drp] | OPHTHALMIC | Status: DC
  Administered 2019-04-20: 2 [drp] via OPHTHALMIC

## 2019-04-20 NOTE — ED Notes (Signed)
Patient verbalizes understanding of discharge instructions. Opportunity for questioning and answers were provided. Patient discharged from [facility] by provider.  

## 2019-04-20 NOTE — Discharge Instructions (Signed)
Tobrex 1 to 2 drops in the right eye every 4 hours while awake You can do 2 drops for severe infection every hour until you see improvement.  Then 1- 2 drops every 4 hours.  If your symptoms do not improve or worsen in the next 24 to 48 hours please follow-up promptly.

## 2019-04-20 NOTE — ED Triage Notes (Signed)
Patient presents to Urgent Care with complaints of right eye pain and redness and swelling since 2 days ago. Patient states she has tried eye drops and rinsing with baby shampoo. Pt states she does not have small children at home.

## 2019-04-20 NOTE — ED Provider Notes (Signed)
[facility]    CSN: [id number] Arrival date & time: 04/20/19  4098     History   Chief Complaint Chief Complaint  Patient presents with  . Eye Problem    HPI Kaylee Simpson is a 43 y.o. female.   Pt is a 43 year old female that presents with right eye swelling, drainage, mild itching and tenderness. Symptoms have been constant and worsening over the last 2 days. Started Monday with just some upper lid swelling and she was doing lid scrubs and warm compresses. Treating as a stye.  Denies any foreign body in the eye.  Denies any eye injuries.  Denies any fevers, chills, body aches, headaches.  Mild blurred vision due to glazed over the eye. No recent sick contacts. No cough, nasal congestion, sneezing, rhinorrhea.   ROS per HPI      Past Medical History:  Diagnosis Date  . Complication of anesthesia   . Hypertension    not a problem  . PONV (postoperative nausea and vomiting)   . Pyelonephritis "several times"    Patient Active Problem List   Diagnosis Date Noted  . Trochanteric bursitis, left hip 05/24/2018  . Pain in left ankle and joints of left foot 05/24/2018  . Chronic pain of left ankle 12/10/2016  . Status post open reduction with internal fixation (ORIF) of fracture of ankle 12/10/2016  . Status post ORIF of fracture of ankle 04/16/2016  . Anemia 10/08/2012  . Pyelonephritis 10/05/2012  . Hypokalemia 10/05/2012  . Hyponatremia 10/05/2012  . Tachycardia 10/05/2012  . SIRS (systemic inflammatory response syndrome) (HCC) 10/05/2012  . Acute renal insufficiency 10/05/2012    Past Surgical History:  Procedure Laterality Date  . DILATION AND CURETTAGE OF UTERUS  1997; 1998  . FRACTURE SURGERY    . ORIF ANKLE FRACTURE Left 04/16/2016   Procedure: OPEN REDUCTION INTERNAL FIXATION (ORIF) LEFT BIMALLEOLAR ANKLE FRACTURE WITH SYNDESMOSIS SCREW;  Surgeon: Eldred Manges, MD;  Location: [facility];  Service: Orthopedics;  Laterality: Left;  . ORIF ANKLE FRACTURE  BIMALLEOLAR Left 04/16/2016   ORIF syndesmosis.  Marland Kitchen PERCUTANEOUS PINNING TOE FRACTURE Right 2011   great toe and middle    OB History    Gravida  6   Para  4   Term      Preterm  4   AB  2   Living  4     SAB      TAB      Ectopic      Multiple      Live Births  4            Home Medications    Prior to Admission medications   Medication Sig Start Date End Date Taking? Authorizing Provider  acetaminophen (TYLENOL) 500 MG tablet Take 1,000 mg by mouth every 6 (six) hours as needed for mild pain.     [provider]  diclofenac sodium (VOLTAREN) 1 % GEL Apply 2 g topically 4 (four) times daily. Patient taking differently: Apply 2 g 4 (four) times daily as needed topically.  12/10/16   Cammy Copa, MD  Menthol-Methyl Salicylate (MUSCLE RUB) 10-15 % CREA Apply 1 application topically 3 (three) times daily as needed for muscle pain.    [provider]  Misc. Devices (FREE SPIRIT KNEE/LEG WALKER) MISC 1 Device by Does not apply route once. Patient not taking: Reported on 12/10/2016 04/11/16   Lyndal Pulley, MD  Multiple Vitamins-Minerals (MULTIVITAMIN WOMEN PO) Take 1 tablet by  mouth once a week.    [provider]    Family History History reviewed. No pertinent family history.  Social History Social History   Tobacco Use  . Smoking status: Current Every Day Smoker    Packs/day: 0.25    Years: 10.00    Pack years: 2.50    Types: Cigarettes  . Smokeless tobacco: Current User  Substance Use Topics  . Alcohol use: Yes    Alcohol/week: 6.0 standard drinks    Types: 6 Cans of beer per week    Comment: Social  . Drug use: No     Allergies   Patient has no known allergies.   Review of Systems Review of Systems   Physical Exam Triage Vital Signs ED Triage Vitals  Enc Vitals Group     BP 04/20/19 0931 (!) 160/112     Pulse Rate 04/20/19 0931 78     Resp 04/20/19 0931 17     Temp 04/20/19 0931 97.8 F (36.6 C)      Temp Source 04/20/19 0931 Oral     SpO2 04/20/19 0931 100 %     Weight --      Height --      Head Circumference --      Peak Flow --      Pain Score 04/20/19 0929 10     Pain Loc --      Pain Edu? --      Excl. in GC? --    No data found.  Updated Vital Signs BP (!) 160/112 (BP Location: Left Arm)   Pulse 78   Temp 97.8 F (36.6 C) (Oral)   Resp 17   SpO2 100%   Visual Acuity Right Eye Distance:   Left Eye Distance:   Bilateral Distance:    Right Eye Near:   Left Eye Near:    Bilateral Near:     Physical Exam Constitutional:      General: She is not in acute distress.    Appearance: She is not ill-appearing, toxic-appearing or diaphoretic.  HENT:     Head: Normocephalic and atraumatic.     Nose: Nose normal.  Eyes:     Extraocular Movements: Extraocular movements intact.     Pupils: Pupils are equal, round, and reactive to light.     Comments: See picture for detail.   Neck:     Musculoskeletal: Normal range of motion.  Pulmonary:     Effort: Pulmonary effort is normal.  Musculoskeletal: Normal range of motion.  Skin:    General: Skin is warm and dry.  Neurological:     Mental Status: She is alert.  Psychiatric:        Mood and Affect: Mood normal.        UC Treatments / Results  Labs (all labs ordered are listed, but only abnormal results are displayed) Labs Reviewed - No data to display  EKG None  Radiology No results found.  Procedures Procedures (including critical care time)  Medications Ordered in UC Medications  tobramycin (TOBREX) 0.3 % ophthalmic solution 2 drop (2 drops Right Eye Given by Other 04/20/19 1006)    Initial Impression / Assessment and Plan / UC Course  I have reviewed the triage vital signs and the nursing notes.  Pertinent labs & imaging results that were available during my care of the patient were reviewed by me and considered in my medical decision making (see chart for details).    Bacterial conjunctivitis  Treating for bacterial conjunctivitis 2 drops of Tobrex given here in clinic and the rest sent homw with pt.  Instructed that she can use this every hour due to severe infection until symptoms mildly improved. She can then start using 1 to 2 drops every 4 hours. Patient understanding agrees.  Should precautions and instructions if her symptoms continue to worsen despite treatment she need to return promptly Final Clinical Impressions(s) / UC Diagnoses   Final diagnoses:  Acute bacterial conjunctivitis of right eye     Discharge Instructions     Tobrex 1 to 2 drops in the right eye every 4 hours while awake You can do 2 drops for severe infection every hour until you see improvement.  Then 1- 2 drops every 4 hours.  If your symptoms do not improve or worsen in the next 24 to 48 hours please follow-up promptly.      ED Prescriptions    None     Controlled Substance Prescriptions [place] Controlled Substance Registry consulted? Not Applicable   Janace Aris, NP 04/20/19 1030

## 2019-08-29 IMAGING — CT CT ABD-PELV W/ CM
2 of 5 series · 16 of 46 positions shown, 18 images · IV contrast (ISOVUE)
Comparison: 10/07/2012.

CLINICAL DATA: Right upper quadrant pain for 3 days, initial
encounter

EXAM:
CT ABDOMEN AND PELVIS WITH CONTRAST
TECHNIQUE: Multidetector CT imaging of the abdomen and pelvis was performed
using the standard protocol following bolus administration of
intravenous contrast.
CONTRAST:  100 mL [id number] IOPAMIDOL ([id number]) INJECTION 61%

[Series 4: axial st · axial · 0.68mm/px · z∈[+1031,+1396]mm · 13 of 87 slices shown, 15 images]
[im 7/87  soft-tissue]
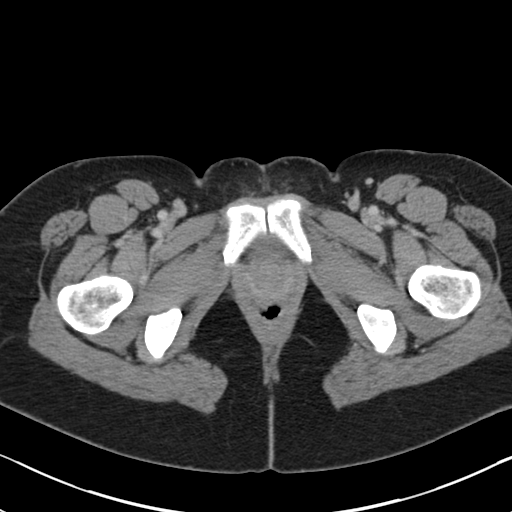
[im 7/87  bone]
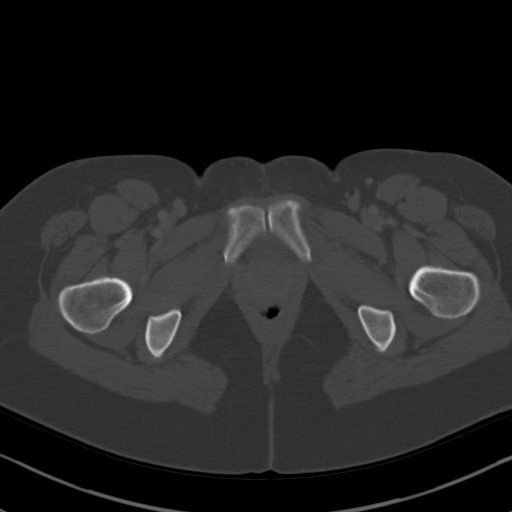
[im 13/87  soft-tissue]
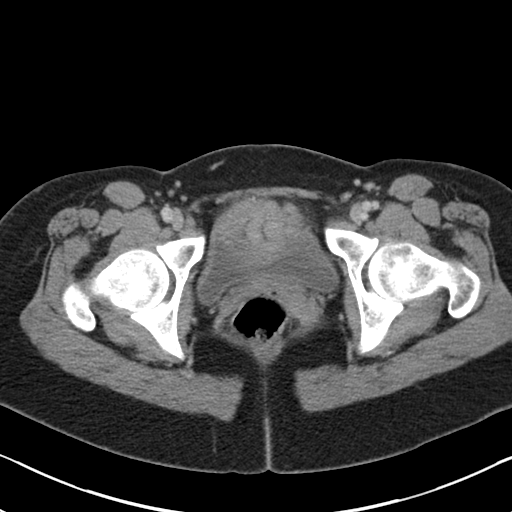
[im 19/87  soft-tissue]
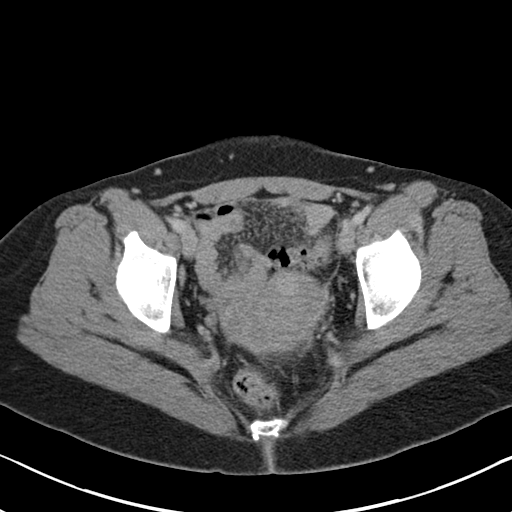
[im 25/87  soft-tissue]
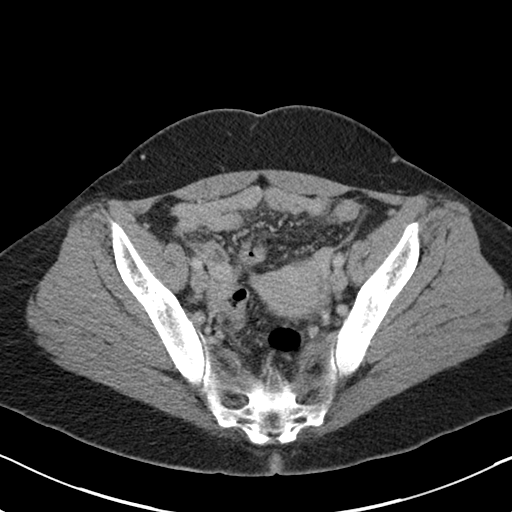
[im 31/87  soft-tissue]
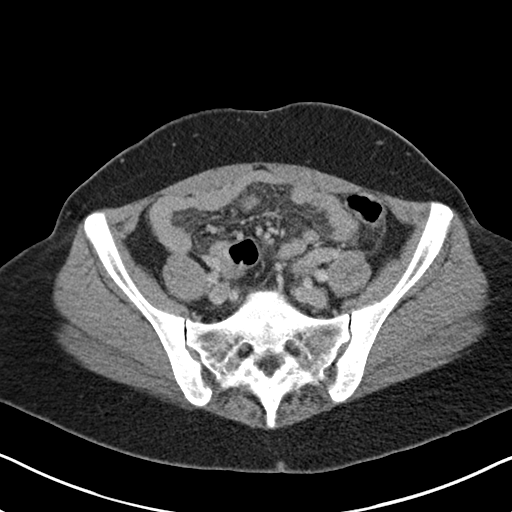
[im 37/87  soft-tissue]
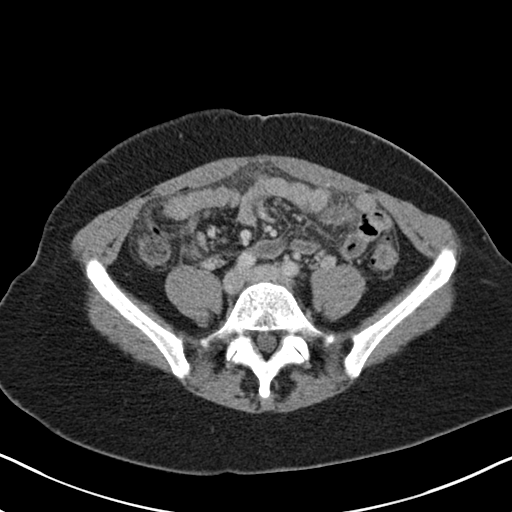
[im 44/87  soft-tissue]
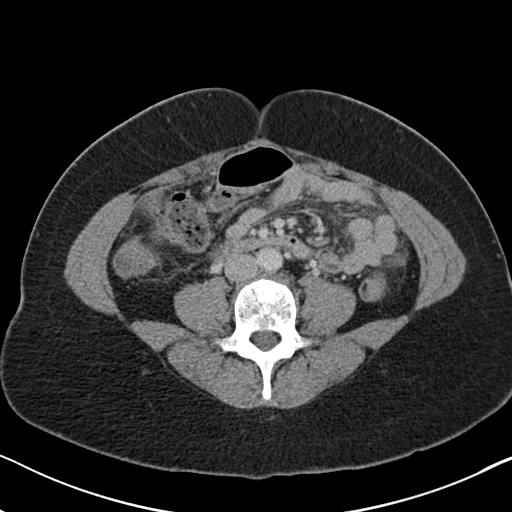
[im 50/87  soft-tissue]
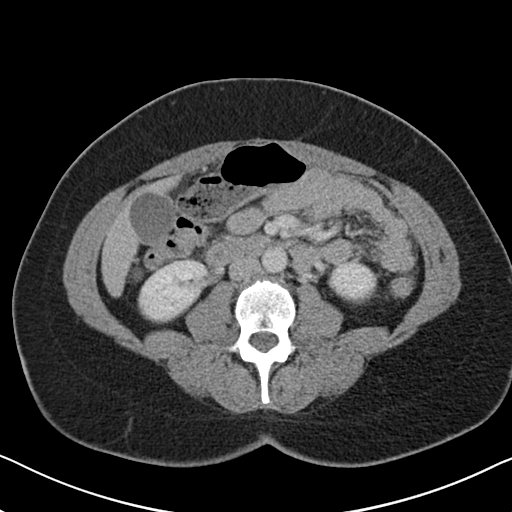
[im 56/87  soft-tissue]
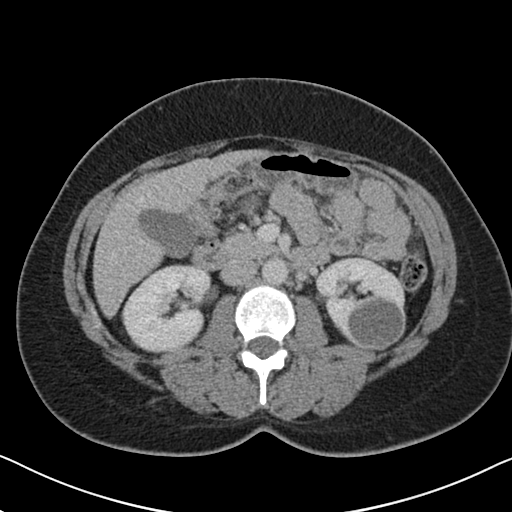
[im 56/87  bone]
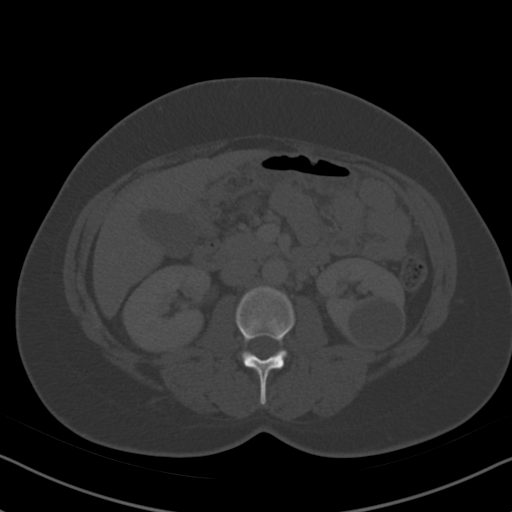
[im 62/87  soft-tissue]
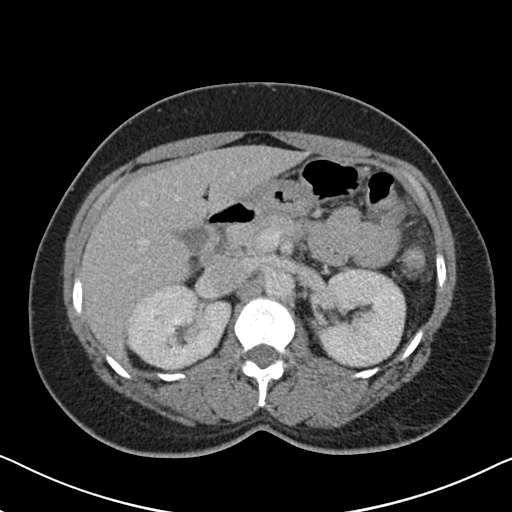
[im 68/87  soft-tissue]
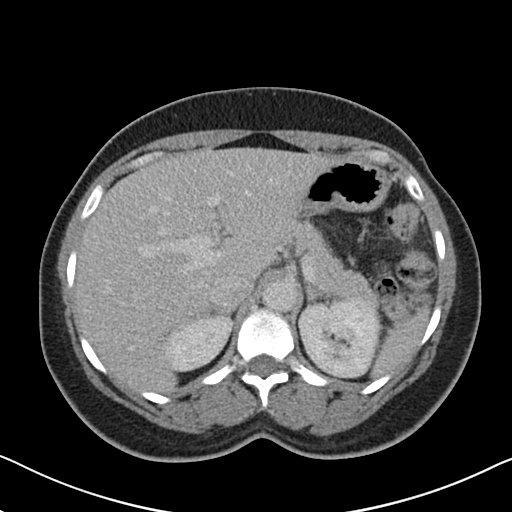
[im 74/87  soft-tissue]
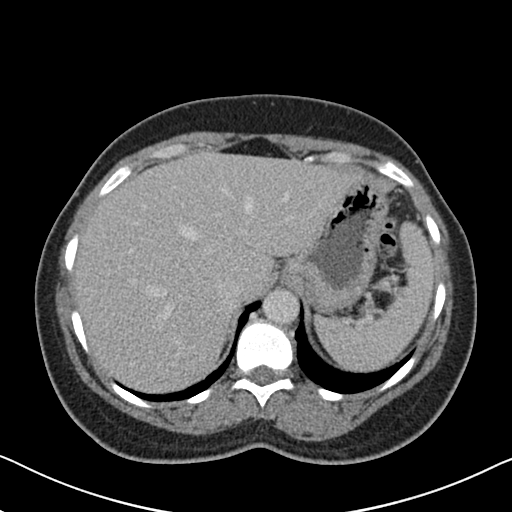
[im 80/87  soft-tissue]
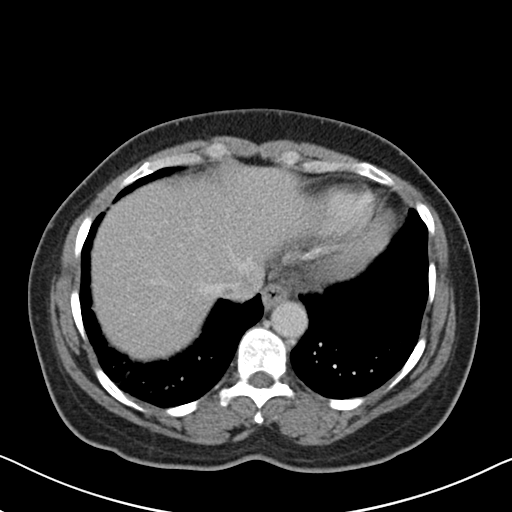

[Series 7: coronal st · coronal · 0.72mm/px · 3 of 93 slices shown]
[im 31/93  soft-tissue]
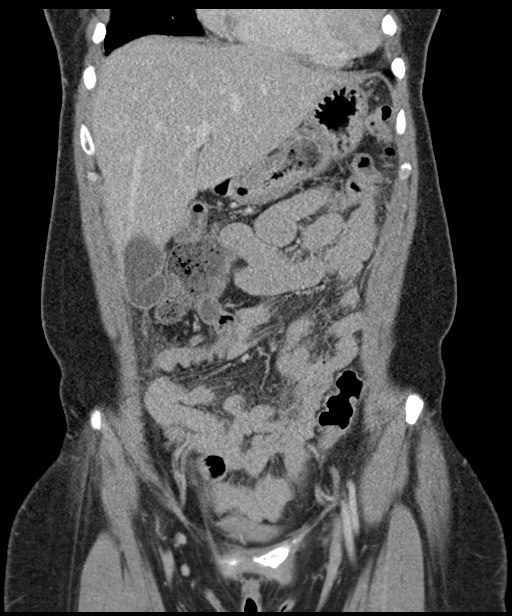
[im 41/93  soft-tissue]
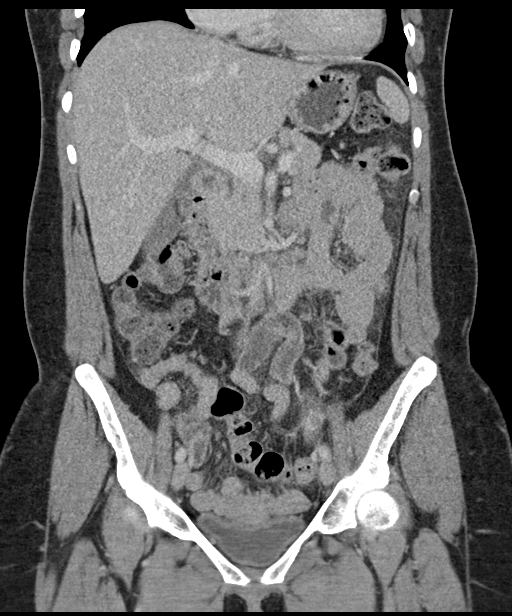
[im 52/93  soft-tissue]
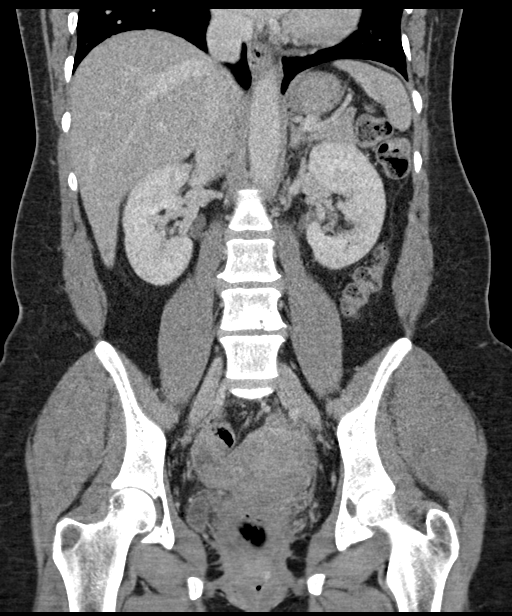

[16 of 46 positions shown; findings below may reference images not displayed]

FINDINGS: Lower chest: Patchy dependent atelectatic changes are seen in the
bases bilaterally

Hepatobiliary: Mild fatty infiltration of the liver is noted. The
gallbladder is within normal limits.

Pancreas: Unremarkable. No pancreatic ductal dilatation or
surrounding inflammatory changes.

Spleen: Normal in size without focal abnormality.

Adrenals/Urinary Tract: Adrenal glands are unremarkable. Kidneys
show no renal calculi or obstructive changes. Stable left renal cyst
is noted measuring 3.8 cm. Bladder is decompressed.

Stomach/Bowel: Scattered diverticular change of the colon is noted
without definitive diverticulitis. The appendix is within normal
limits. No obstructive changes are noted. Some mild venous
engorgement is noted within the small bowel mesentery few scattered
lymph nodes are identified. This may be related to a generalized
degree of small-bowel enteritis. No abscess is identified.

Vascular/Lymphatic: No significant vascular findings are present. No
enlarged abdominal or pelvic lymph nodes.

Reproductive: Status post hysterectomy. No adnexal masses.

Other: No abdominal wall hernia or abnormality. No abdominopelvic
ascites.

Musculoskeletal: No acute or significant osseous findings.
IMPRESSION: Mild venous engorgement and mesenteric inflammatory change with
small scattered lymph nodes throughout the mesentery. This may
represent a generalized small bowel enteritis or mesenteric
adenitis. No obstructive changes are seen. No focal abscess is
identified.

Chronic changes as described above.

## 2019-11-28 ENCOUNTER — Other Ambulatory Visit: Payer: Self-pay

## 2019-11-28 ENCOUNTER — Ambulatory Visit (INDEPENDENT_AMBULATORY_CARE_PROVIDER_SITE_OTHER): Payer: Medicaid Other

## 2019-11-28 ENCOUNTER — Encounter (HOSPITAL_COMMUNITY): Payer: Self-pay

## 2019-11-28 ENCOUNTER — Ambulatory Visit (HOSPITAL_COMMUNITY)
Admission: EM | Admit: 2019-11-28 | Discharge: 2019-11-28 | Disposition: A | Discharge status: Home / Self Care | Payer: Medicaid Other | Attending: Family Medicine | Admitting: Family Medicine

## 2019-11-28 DIAGNOSIS — S82435A Nondisplaced oblique fracture of shaft of left fibula, initial encounter for closed fracture: Secondary | ICD-10-CM

## 2019-11-28 DIAGNOSIS — S82302A Unspecified fracture of lower end of left tibia, initial encounter for closed fracture: Secondary | ICD-10-CM | POA: Diagnosis not present

## 2019-11-28 MED ORDER — KETOROLAC TROMETHAMINE 60 MG/2ML IM SOLN
60.0000 mg | Freq: Once | INTRAMUSCULAR | Status: AC
  Administered 2019-11-28: 60 mg via INTRAMUSCULAR

## 2019-11-28 MED ORDER — KETOROLAC TROMETHAMINE 60 MG/2ML IM SOLN
INTRAMUSCULAR | Status: AC
  Filled 2019-11-28: qty 2

## 2019-11-28 MED ORDER — IBUPROFEN 600 MG PO TABS: 600.0000 mg | ORAL_TABLET | Freq: Three times a day (TID) | ORAL | 0 refills | Status: DC | PRN

## 2019-11-28 NOTE — Discharge Instructions (Signed)
Please try ice and elevation  Please try to use vitamin K2, Vitamin D and calcium  Please follow up within a week.
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
## 2019-12-05 ENCOUNTER — Ambulatory Visit: Payer: Medicaid Other | Admitting: Family Medicine

## 2019-12-05 ENCOUNTER — Other Ambulatory Visit: Payer: Self-pay

## 2019-12-05 ENCOUNTER — Encounter: Payer: Self-pay | Admitting: Family Medicine

## 2019-12-05 VITALS — BP 140/96 | Ht 66.0 in | Wt 188.0 lb

## 2019-12-05 DIAGNOSIS — S82436D Nondisplaced oblique fracture of shaft of unspecified fibula, subsequent encounter for closed fracture with routine healing: Secondary | ICD-10-CM | POA: Insufficient documentation

## 2019-12-05 DIAGNOSIS — S82435D Nondisplaced oblique fracture of shaft of left fibula, subsequent encounter for closed fracture with routine healing: Secondary | ICD-10-CM

## 2019-12-05 MED ORDER — MELOXICAM 15 MG PO TABS: 15.0000 mg | ORAL_TABLET | Freq: Every day | ORAL | 2 refills | Status: DC

## 2019-12-05 MED ORDER — HYDROCODONE-ACETAMINOPHEN 5-325 MG PO TABS: 1.0000 | ORAL_TABLET | Freq: Four times a day (QID) | ORAL | 0 refills | Status: DC | PRN

## 2019-12-05 NOTE — Patient Instructions (Signed)
You have a distal fibula shaft fracture. Wear boot when up and walking around. Come out of this at least twice a day to do simple motion exercises. Icing 15 minutes at a time at least 3-4 times a day. Meloxicam 15mg  daily with food for pain and inflammation - don't take aleve or ibuprofen with this. Norco as needed for severe pain (no working or driving on this) - would recommend taking 1-2 tylenol in morning, at lunch then using norco after you get home from work and at bedtime. Do NOT take more than 4000mg  of tylenol a day (each norco tablet has 325mg  of tylenol). Follow up with me in 2 weeks - get x-rays before you return to see [place].
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
## 2019-12-06 ENCOUNTER — Encounter: Payer: Self-pay | Admitting: Family Medicine

## 2019-12-15 ENCOUNTER — Other Ambulatory Visit: Payer: Self-pay | Admitting: Cardiology

## 2019-12-15 DIAGNOSIS — Z20828 Contact with and (suspected) exposure to other viral communicable diseases: Secondary | ICD-10-CM | POA: Diagnosis not present

## 2019-12-15 DIAGNOSIS — Z20822 Contact with and (suspected) exposure to covid-19: Secondary | ICD-10-CM

## 2019-12-16 LAB — NOVEL CORONAVIRUS, NAA: SARS-CoV-2, NAA: NOT DETECTED

## 2020-06-28 DIAGNOSIS — Z419 Encounter for procedure for purposes other than remedying health state, unspecified: Secondary | ICD-10-CM | POA: Diagnosis not present

## 2020-07-14 ENCOUNTER — Encounter (HOSPITAL_COMMUNITY): Payer: Self-pay | Admitting: Emergency Medicine

## 2020-07-14 ENCOUNTER — Inpatient Hospital Stay (HOSPITAL_COMMUNITY)
Admission: EM | Admit: 2020-07-14 | Discharge: 2020-07-16 | DRG: 871 | Disposition: A | Discharge status: Home / Self Care | Payer: Medicaid Other | Attending: Family Medicine | Admitting: Family Medicine

## 2020-07-14 ENCOUNTER — Emergency Department (HOSPITAL_COMMUNITY): Payer: Medicaid Other

## 2020-07-14 ENCOUNTER — Other Ambulatory Visit: Payer: Self-pay

## 2020-07-14 DIAGNOSIS — J690 Pneumonitis due to inhalation of food and vomit: Secondary | ICD-10-CM

## 2020-07-14 DIAGNOSIS — R4020 Unspecified coma: Secondary | ICD-10-CM | POA: Diagnosis not present

## 2020-07-14 DIAGNOSIS — N179 Acute kidney failure, unspecified: Secondary | ICD-10-CM | POA: Diagnosis present

## 2020-07-14 DIAGNOSIS — I10 Essential (primary) hypertension: Secondary | ICD-10-CM | POA: Diagnosis not present

## 2020-07-14 DIAGNOSIS — R680 Hypothermia, not associated with low environmental temperature: Secondary | ICD-10-CM | POA: Diagnosis present

## 2020-07-14 DIAGNOSIS — G9341 Metabolic encephalopathy: Secondary | ICD-10-CM

## 2020-07-14 DIAGNOSIS — J189 Pneumonia, unspecified organism: Secondary | ICD-10-CM | POA: Diagnosis not present

## 2020-07-14 DIAGNOSIS — R4182 Altered mental status, unspecified: Secondary | ICD-10-CM | POA: Diagnosis not present

## 2020-07-14 DIAGNOSIS — T50901A Poisoning by unspecified drugs, medicaments and biological substances, accidental (unintentional), initial encounter: Secondary | ICD-10-CM | POA: Diagnosis present

## 2020-07-14 DIAGNOSIS — F1410 Cocaine abuse, uncomplicated: Secondary | ICD-10-CM | POA: Diagnosis present

## 2020-07-14 DIAGNOSIS — R410 Disorientation, unspecified: Secondary | ICD-10-CM | POA: Diagnosis not present

## 2020-07-14 DIAGNOSIS — A419 Sepsis, unspecified organism: Principal | ICD-10-CM

## 2020-07-14 DIAGNOSIS — T68XXXA Hypothermia, initial encounter: Secondary | ICD-10-CM | POA: Diagnosis not present

## 2020-07-14 DIAGNOSIS — R404 Transient alteration of awareness: Secondary | ICD-10-CM | POA: Diagnosis not present

## 2020-07-14 DIAGNOSIS — F17210 Nicotine dependence, cigarettes, uncomplicated: Secondary | ICD-10-CM | POA: Diagnosis present

## 2020-07-14 DIAGNOSIS — Z20822 Contact with and (suspected) exposure to covid-19: Secondary | ICD-10-CM | POA: Diagnosis present

## 2020-07-14 DIAGNOSIS — F1910 Other psychoactive substance abuse, uncomplicated: Secondary | ICD-10-CM

## 2020-07-14 DIAGNOSIS — F1010 Alcohol abuse, uncomplicated: Secondary | ICD-10-CM | POA: Diagnosis present

## 2020-07-14 DIAGNOSIS — R6520 Severe sepsis without septic shock: Secondary | ICD-10-CM | POA: Diagnosis not present

## 2020-07-14 DIAGNOSIS — G9340 Encephalopathy, unspecified: Secondary | ICD-10-CM | POA: Diagnosis not present

## 2020-07-14 DIAGNOSIS — N281 Cyst of kidney, acquired: Secondary | ICD-10-CM | POA: Diagnosis not present

## 2020-07-14 DIAGNOSIS — R0902 Hypoxemia: Secondary | ICD-10-CM | POA: Diagnosis not present

## 2020-07-14 DIAGNOSIS — F1210 Cannabis abuse, uncomplicated: Secondary | ICD-10-CM | POA: Diagnosis present

## 2020-07-14 DIAGNOSIS — M4802 Spinal stenosis, cervical region: Secondary | ICD-10-CM | POA: Diagnosis not present

## 2020-07-14 DIAGNOSIS — R0689 Other abnormalities of breathing: Secondary | ICD-10-CM | POA: Diagnosis not present

## 2020-07-14 DIAGNOSIS — I4891 Unspecified atrial fibrillation: Secondary | ICD-10-CM | POA: Diagnosis present

## 2020-07-14 LAB — COMPREHENSIVE METABOLIC PANEL
ALT: 40 U/L (ref 0–44)
AST: 68 U/L — ABNORMAL HIGH (ref 15–41)
Albumin: 3.7 g/dL (ref 3.5–5.0)
Alkaline Phosphatase: 71 U/L (ref 38–126)
Anion gap: 17 — ABNORMAL HIGH (ref 5–15)
BUN: 20 mg/dL (ref 6–20)
CO2: 18 mmol/L — ABNORMAL LOW (ref 22–32)
Calcium: 9.1 mg/dL (ref 8.9–10.3)
Chloride: 108 mmol/L (ref 98–111)
Creatinine, Ser: 1.95 mg/dL — ABNORMAL HIGH (ref 0.44–1.00)
GFR calc Af Amer: 35 mL/min — ABNORMAL LOW (ref >60)
GFR calc non Af Amer: 31 mL/min — ABNORMAL LOW (ref >60)
Glucose, Bld: 181 mg/dL — ABNORMAL HIGH (ref 70–99)
Potassium: 4.1 mmol/L (ref 3.5–5.1)
Sodium: 143 mmol/L (ref 135–145)
Total Bilirubin: <0.1 mg/dL — ABNORMAL LOW (ref 0.3–1.2)
Total Protein: 7.5 g/dL (ref 6.5–8.1)

## 2020-07-14 LAB — URINALYSIS, ROUTINE W REFLEX MICROSCOPIC
Bilirubin Urine: NEGATIVE
Glucose, UA: NEGATIVE mg/dL
Ketones, ur: NEGATIVE mg/dL
Nitrite: NEGATIVE
Protein, ur: 100 mg/dL — AB
Specific Gravity, Urine: 1.011 (ref 1.005–1.030)
pH: 6 (ref 5.0–8.0)

## 2020-07-14 LAB — HCG, QUANTITATIVE, PREGNANCY: hCG, Beta Chain, Quant, S: 2 m[IU]/mL (ref <5)

## 2020-07-14 LAB — I-STAT BETA HCG BLOOD, ED (MC, WL, AP ONLY): I-stat hCG, quantitative: <5 m[IU]/mL (ref <5)

## 2020-07-14 LAB — CBC
HCT: 50.3 % — ABNORMAL HIGH (ref 36.0–46.0)
Hemoglobin: 15.1 g/dL — ABNORMAL HIGH (ref 12.0–15.0)
MCH: 31.9 pg (ref 26.0–34.0)
MCHC: 30 g/dL (ref 30.0–36.0)
MCV: 106.1 fL — ABNORMAL HIGH (ref 80.0–100.0)
Platelets: 279 10*3/uL (ref 150–400)
RBC: 4.74 MIL/uL (ref 3.87–5.11)
RDW: 13.7 % (ref 11.5–15.5)
WBC: 10.5 10*3/uL (ref 4.0–10.5)
nRBC: 0 % (ref 0.0–0.2)

## 2020-07-14 LAB — SALICYLATE LEVEL: Salicylate Lvl: <7 mg/dL — ABNORMAL LOW (ref 7.0–30.0)

## 2020-07-14 LAB — RAPID URINE DRUG SCREEN, HOSP PERFORMED
Amphetamines: NOT DETECTED
Barbiturates: NOT DETECTED
Benzodiazepines: NOT DETECTED
Cocaine: POSITIVE — AB
Opiates: NOT DETECTED
Tetrahydrocannabinol: POSITIVE — AB

## 2020-07-14 LAB — LACTIC ACID, PLASMA
Lactic Acid, Venous: 5.9 mmol/L (ref 0.5–1.9)
Lactic Acid, Venous: 4.5 mmol/L (ref 0.5–1.9)

## 2020-07-14 LAB — ETHANOL: Alcohol, Ethyl (B): 96 mg/dL — ABNORMAL HIGH (ref <10)

## 2020-07-14 LAB — SARS CORONAVIRUS 2 BY RT PCR (HOSPITAL ORDER, PERFORMED IN ~~LOC~~ HOSPITAL LAB): SARS Coronavirus 2: NEGATIVE

## 2020-07-14 LAB — CBG MONITORING, ED: Glucose-Capillary: 209 mg/dL — ABNORMAL HIGH (ref 70–99)

## 2020-07-14 LAB — TSH: TSH: 4.258 u[IU]/mL (ref 0.350–4.500)

## 2020-07-14 LAB — ACETAMINOPHEN LEVEL: Acetaminophen (Tylenol), Serum: <10 ug/mL — ABNORMAL LOW (ref 10–30)

## 2020-07-14 MED ORDER — SODIUM CHLORIDE 0.9% FLUSH: 3.0000 mL | Freq: Once | INTRAVENOUS | Status: DC

## 2020-07-14 MED ORDER — ADULT MULTIVITAMIN W/MINERALS CH
1.0000 | ORAL_TABLET | Freq: Every day | ORAL | Status: DC
  Administered 2020-07-16: 1 via ORAL
  Filled 2020-07-14: qty 1

## 2020-07-14 MED ORDER — LACTATED RINGERS IV SOLN: INTRAVENOUS | Status: DC

## 2020-07-14 MED ORDER — VANCOMYCIN HCL 1500 MG/300ML IV SOLN
1500.0000 mg | Freq: Once | INTRAVENOUS | Status: AC
  Administered 2020-07-14: 1500 mg via INTRAVENOUS
  Filled 2020-07-14: qty 300

## 2020-07-14 MED ORDER — THIAMINE HCL 100 MG/ML IJ SOLN
100.0000 mg | Freq: Every day | INTRAMUSCULAR | Status: DC
  Filled 2020-07-14: qty 2

## 2020-07-14 MED ORDER — LORAZEPAM 2 MG/ML IJ SOLN: 1.0000 mg | INTRAMUSCULAR | Status: DC | PRN

## 2020-07-14 MED ORDER — LORAZEPAM 2 MG/ML IJ SOLN
1.0000 mg | Freq: Once | INTRAMUSCULAR | Status: DC
  Filled 2020-07-14: qty 1

## 2020-07-14 MED ORDER — SODIUM CHLORIDE 0.9 % IV BOLUS
1000.0000 mL | Freq: Once | INTRAVENOUS | Status: AC
  Administered 2020-07-15: 1000 mL via INTRAVENOUS

## 2020-07-14 MED ORDER — FOLIC ACID 1 MG PO TABS
1.0000 mg | ORAL_TABLET | Freq: Every day | ORAL | Status: DC
  Administered 2020-07-16: 1 mg via ORAL
  Filled 2020-07-14: qty 1

## 2020-07-14 MED ORDER — ENOXAPARIN SODIUM 40 MG/0.4ML ~~LOC~~ SOLN
40.0000 mg | SUBCUTANEOUS | Status: DC
  Administered 2020-07-16: 40 mg via SUBCUTANEOUS
  Filled 2020-07-14: qty 0.4

## 2020-07-14 MED ORDER — ONDANSETRON HCL 4 MG/2ML IJ SOLN
4.0000 mg | Freq: Once | INTRAMUSCULAR | Status: AC
  Administered 2020-07-15: 4 mg via INTRAVENOUS
  Filled 2020-07-14: qty 2

## 2020-07-14 MED ORDER — VANCOMYCIN HCL IN DEXTROSE 1-5 GM/200ML-% IV SOLN: 1000.0000 mg | Freq: Once | INTRAVENOUS | Status: DC

## 2020-07-14 MED ORDER — LACTATED RINGERS IV BOLUS
1000.0000 mL | Freq: Once | INTRAVENOUS | Status: AC
  Administered 2020-07-14: 1000 mL via INTRAVENOUS

## 2020-07-14 MED ORDER — SODIUM CHLORIDE 0.9 % IV SOLN
3.0000 g | Freq: Three times a day (TID) | INTRAVENOUS | Status: DC
  Administered 2020-07-15 – 2020-07-16 (×5): 3 g via INTRAVENOUS
  Filled 2020-07-14 (×6): qty 8
  Filled 2020-07-14: qty 3
  Filled 2020-07-14: qty 8

## 2020-07-14 MED ORDER — LORAZEPAM 1 MG PO TABS
1.0000 mg | ORAL_TABLET | ORAL | Status: DC | PRN
  Administered 2020-07-15: 2 mg via ORAL
  Filled 2020-07-14: qty 2

## 2020-07-14 MED ORDER — SODIUM CHLORIDE 0.9 % IV SOLN
2.0000 g | Freq: Once | INTRAVENOUS | Status: AC
  Administered 2020-07-14: 2 g via INTRAVENOUS
  Filled 2020-07-14: qty 2

## 2020-07-14 MED ORDER — THIAMINE HCL 100 MG PO TABS
100.0000 mg | ORAL_TABLET | Freq: Every day | ORAL | Status: DC
  Administered 2020-07-16: 100 mg via ORAL
  Filled 2020-07-14: qty 1

## 2020-07-14 NOTE — ED Provider Notes (Addendum)
[facility] EMERGENCY DEPARTMENT Provider Note   CSN: [id number] Arrival date & time: 07/14/20  1709     History Chief Complaint  Patient presents with  . Altered Mental Status    Kaylee Simpson is a 44 y.o. female who presents via EMS for AMS.  Patient was found at neighbor's house.  When EMS arrived on the scene fire was giving breaths with BVM.  2 mg Narcan given (1 mg intranasal, 1 mg IV) with reported improvement.  Patient oriented only to herself, birthdate.  Patient unable to say what happened that prompted presentation.  Patient denies drug use.  The history is provided by the EMS personnel. The history is limited by the condition of the patient.       Past Medical History:  Diagnosis Date  . Complication of anesthesia   . Hypertension    not a problem  . PONV (postoperative nausea and vomiting)   . Pyelonephritis "several times"    Patient Active Problem List   Diagnosis Date Noted  . Closed nondisplaced oblique fracture of shaft of fibula with routine healing 12/05/2019  . Trochanteric bursitis, left hip 05/24/2018  . Pain in left ankle and joints of left foot 05/24/2018  . Chronic pain of left ankle 12/10/2016  . Status post open reduction with internal fixation (ORIF) of fracture of ankle 12/10/2016  . Status post ORIF of fracture of ankle 04/16/2016  . Anemia 10/08/2012  . Pyelonephritis 10/05/2012  . Hypokalemia 10/05/2012  . Hyponatremia 10/05/2012  . Tachycardia 10/05/2012  . SIRS (systemic inflammatory response syndrome) (HCC) 10/05/2012  . Acute renal insufficiency 10/05/2012    Past Surgical History:  Procedure Laterality Date  . DILATION AND CURETTAGE OF UTERUS  1997; 1998  . FRACTURE SURGERY    . ORIF ANKLE FRACTURE Left 04/16/2016   Procedure: OPEN REDUCTION INTERNAL FIXATION (ORIF) LEFT BIMALLEOLAR ANKLE FRACTURE WITH SYNDESMOSIS SCREW;  Surgeon: Eldred Manges, MD;  Location: [facility] OR;  Service: Orthopedics;  Laterality: Left;  .  ORIF ANKLE FRACTURE BIMALLEOLAR Left 04/16/2016   ORIF syndesmosis.  Marland Kitchen PERCUTANEOUS PINNING TOE FRACTURE Right 2011   great toe and middle     OB History    Gravida  6   Para  4   Term      Preterm  4   AB  2   Living  4     SAB      TAB      Ectopic      Multiple      Live Births  4           No family history on file.  Social History   Tobacco Use  . Smoking status: Current Every Day Smoker    Packs/day: 0.25    Years: 10.00    Pack years: 2.50    Types: Cigarettes  . Smokeless tobacco: Current User  Substance Use Topics  . Alcohol use: Yes    Alcohol/week: 6.0 standard drinks    Types: 6 Cans of beer per week    Comment: Social  . Drug use: No    Home Medications Prior to Admission medications   Medication Sig Start Date End Date Taking? Authorizing Provider  acetaminophen (TYLENOL) 500 MG tablet Take 1,000 mg by mouth 3 (three) times daily as needed for headache (pain).    Yes [provider]  diclofenac sodium (VOLTAREN) 1 % GEL Apply 2 g topically 4 (four) times daily. Patient not taking: Reported on  07/14/2020 12/10/16   Cammy Copa, MD  HYDROcodone-acetaminophen (NORCO) 5-325 MG tablet Take 1 tablet by mouth every 6 (six) hours as needed for moderate pain. Patient not taking: Reported on 07/14/2020 12/05/19   Lenda Kelp, MD  ibuprofen (ADVIL) 600 MG tablet Take 1 tablet (600 mg total) by mouth every 8 (eight) hours as needed. Patient not taking: Reported on 07/14/2020 11/28/19   Myra Rude, MD  meloxicam (MOBIC) 15 MG tablet Take 1 tablet (15 mg total) by mouth daily. Patient not taking: Reported on 07/14/2020 12/05/19   Lenda Kelp, MD  Misc. Devices (FREE SPIRIT KNEE/LEG WALKER) MISC 1 Device by Does not apply route once. 04/11/16   Lyndal Pulley, MD    Allergies    Patient has no known allergies.  Review of Systems   Review of Systems  Unable to perform ROS: Mental status change    Physical  Exam Updated Vital Signs BP 95/71   Pulse (!) 122   Temp (!) 96.7 F (35.9 C) (Rectal)   Resp 14   Ht 5\' 5"  (1.651 m)   Wt 77.1 kg   SpO2 90%   BMI 28.29 kg/m   Physical Exam Vitals and nursing note reviewed.  Constitutional:      General: She is not in acute distress.    Appearance: She is normal weight. She is ill-appearing. She is not toxic-appearing or diaphoretic.  HENT:     Head: Normocephalic and atraumatic.     Comments: Dried blood noted to center at base.  Midface stable.  No abrasions/lacerations noted.    Nose: Nose normal.     Mouth/Throat:     Mouth: Mucous membranes are moist.  Eyes:     Conjunctiva/sclera: Conjunctivae normal.  Cardiovascular:     Rate and Rhythm: Regular rhythm. Tachycardia present.     Heart sounds: Normal heart sounds. No murmur heard.   Pulmonary:     Effort: Pulmonary effort is normal. No respiratory distress.     Breath sounds: Normal breath sounds.  Abdominal:     Palpations: Abdomen is soft.     Tenderness: There is no abdominal tenderness. There is no guarding or rebound.  Musculoskeletal:        General: No deformity or signs of injury.     Right lower leg: No edema.     Left lower leg: No edema.  Skin:    General: Skin is warm.     Findings: No rash.  Neurological:     Mental Status: She is lethargic and disoriented.     Comments: Patient appears clinically altered.  Psychiatric:        Mood and Affect: Mood normal.        Behavior: Behavior is uncooperative.     ED Results / Procedures / Treatments   Labs (all labs ordered are listed, but only abnormal results are displayed) Labs Reviewed  COMPREHENSIVE METABOLIC PANEL - Abnormal; Notable for the following components:      Result Value   CO2 18 (*)    Glucose, Bld 181 (*)    Creatinine, Ser 1.95 (*)    AST 68 (*)    Total Bilirubin <0.1 (*)    GFR calc non Af Amer 31 (*)    GFR calc Af Amer 35 (*)    Anion gap 17 (*)    All other components within normal  limits  CBC - Abnormal; Notable for the following components:   Hemoglobin 15.1 (*)  HCT 50.3 (*)    MCV 106.1 (*)    All other components within normal limits  ETHANOL - Abnormal; Notable for the following components:   Alcohol, Ethyl (B) 96 (*)    All other components within normal limits  RAPID URINE DRUG SCREEN, HOSP PERFORMED - Abnormal; Notable for the following components:   Cocaine POSITIVE (*)    Tetrahydrocannabinol POSITIVE (*)    All other components within normal limits  URINALYSIS, ROUTINE W REFLEX MICROSCOPIC - Abnormal; Notable for the following components:   APPearance HAZY (*)    Hgb urine dipstick LARGE (*)    Protein, ur 100 (*)    Leukocytes,Ua TRACE (*)    Bacteria, UA RARE (*)    All other components within normal limits  ACETAMINOPHEN LEVEL - Abnormal; Notable for the following components:   Acetaminophen (Tylenol), Serum <10 (*)    All other components within normal limits  SALICYLATE LEVEL - Abnormal; Notable for the following components:   Salicylate Lvl <7.0 (*)    All other components within normal limits  LACTIC ACID, PLASMA - Abnormal; Notable for the following components:   Lactic Acid, Venous 5.9 (*)    All other components within normal limits  LACTIC ACID, PLASMA - Abnormal; Notable for the following components:   Lactic Acid, Venous 4.5 (*)    All other components within normal limits  CBG MONITORING, ED - Abnormal; Notable for the following components:   Glucose-Capillary 209 (*)    All other components within normal limits  SARS CORONAVIRUS 2 BY RT PCR (HOSPITAL ORDER, PERFORMED IN [facility])  CULTURE, BLOOD (ROUTINE X 2)  CULTURE, BLOOD (ROUTINE X 2)  HCG, QUANTITATIVE, PREGNANCY  TSH  I-STAT BETA HCG BLOOD, ED ([facility], [facility], [facility] ONLY)    EKG EKG Interpretation  Date/Time:  Saturday July 14 2020 17:34:05 EDT Ventricular Rate:  120 PR Interval:    QRS Duration: 107 QT Interval:  373 QTC Calculation: 528 R  Axis:   80 Text Interpretation: Atrial fibrillation Probable left ventricular hypertrophy Prolonged QT interval When compared with ECG of EARLIER SAME DATE QT has lengthened Confirmed by Dione Booze ([id number]) on 07/14/2020 11:16:37 PM   Radiology CT Head Wo Contrast  Result Date: 07/14/2020 CLINICAL DATA:  Found unresponsive, altered mental status EXAM: CT HEAD WITHOUT CONTRAST CT CERVICAL SPINE WITHOUT CONTRAST TECHNIQUE: Multidetector CT imaging of the head and cervical spine was performed following the standard protocol without intravenous contrast. Multiplanar CT image reconstructions of the cervical spine were also generated. COMPARISON:  CT head 07/11/2015 FINDINGS: CT HEAD FINDINGS Brain: No evidence of acute infarction, hemorrhage, hydrocephalus, extra-axial collection or mass lesion/mass effect. Vascular: No hyperdense vessel or unexpected calcification. Skull: No calvarial fracture or suspicious osseous lesion. No scalp swelling or hematoma. Sinuses/Orbits: Minimal mural thickening in the ethmoids and maxillary sinuses. Included orbital structures are unremarkable. Other: None. CT CERVICAL SPINE FINDINGS Alignment: Stabilization collar absent. Reversal the normal cervical lordosis centered at the C3-4 level. No evidence of traumatic listhesis. No abnormally widened, perched or jumped facets. Normal alignment of the craniocervical and atlantoaxial articulations. Skull base and vertebrae: Tiny corticated ossification adjacent the tip of the dens is likely degenerative in nature. No convincing acute skull base fracture, vertebral body fracture or height loss. Normal bone mineralization. No worrisome osseous lesions. Minimal spondylitic changes detailed below. Soft tissues and spinal canal: No pre or paravertebral fluid or swelling. No visible canal hematoma. Subglottic secretions are noted in the trachea. Disc levels:  Minimal diffuse mild intervertebral disc height loss with early spondylitic changes.  Aimed maximal disc osteophyte complexes present at C3-4 resulting in at most mild canal stenosis. No other significant canal stenosis or foraminal impingement within the imaged levels of the spine. Upper chest: Some dependent ground-glass opacity noted in the lungs could reflect atelectatic change or developing edema. Other: No concerning thyroid nodules. IMPRESSION: 1. No acute intracranial abnormality. 2. No acute cervical spine fracture or traumatic listhesis. 3. Reversal of the normal cervical lordosis centered at the C3-4 level, possibly positional, degenerative or related to muscle spasm. 4. Subglottic secretions are noted in the trachea. 5. Some dependent ground-glass opacity in the lungs could reflect atelectatic change or developing edema. Electronically Signed   By: Kreg Shropshire M.D.   On: 07/14/2020 18:20   CT Cervical Spine Wo Contrast  Result Date: 07/14/2020 CLINICAL DATA:  Found unresponsive, altered mental status EXAM: CT HEAD WITHOUT CONTRAST CT CERVICAL SPINE WITHOUT CONTRAST TECHNIQUE: Multidetector CT imaging of the head and cervical spine was performed following the standard protocol without intravenous contrast. Multiplanar CT image reconstructions of the cervical spine were also generated. COMPARISON:  CT head 07/11/2015 FINDINGS: CT HEAD FINDINGS Brain: No evidence of acute infarction, hemorrhage, hydrocephalus, extra-axial collection or mass lesion/mass effect. Vascular: No hyperdense vessel or unexpected calcification. Skull: No calvarial fracture or suspicious osseous lesion. No scalp swelling or hematoma. Sinuses/Orbits: Minimal mural thickening in the ethmoids and maxillary sinuses. Included orbital structures are unremarkable. Other: None. CT CERVICAL SPINE FINDINGS Alignment: Stabilization collar absent. Reversal the normal cervical lordosis centered at the C3-4 level. No evidence of traumatic listhesis. No abnormally widened, perched or jumped facets. Normal alignment of the  craniocervical and atlantoaxial articulations. Skull base and vertebrae: Tiny corticated ossification adjacent the tip of the dens is likely degenerative in nature. No convincing acute skull base fracture, vertebral body fracture or height loss. Normal bone mineralization. No worrisome osseous lesions. Minimal spondylitic changes detailed below. Soft tissues and spinal canal: No pre or paravertebral fluid or swelling. No visible canal hematoma. Subglottic secretions are noted in the trachea. Disc levels: Minimal diffuse mild intervertebral disc height loss with early spondylitic changes. Aimed maximal disc osteophyte complexes present at C3-4 resulting in at most mild canal stenosis. No other significant canal stenosis or foraminal impingement within the imaged levels of the spine. Upper chest: Some dependent ground-glass opacity noted in the lungs could reflect atelectatic change or developing edema. Other: No concerning thyroid nodules. IMPRESSION: 1. No acute intracranial abnormality. 2. No acute cervical spine fracture or traumatic listhesis. 3. Reversal of the normal cervical lordosis centered at the C3-4 level, possibly positional, degenerative or related to muscle spasm. 4. Subglottic secretions are noted in the trachea. 5. Some dependent ground-glass opacity in the lungs could reflect atelectatic change or developing edema. Electronically Signed   By: Kreg Shropshire M.D.   On: 07/14/2020 18:20   DG Chest Portable 1 View  Result Date: 07/14/2020 CLINICAL DATA:  Altered mental status. EXAM: PORTABLE CHEST 1 VIEW COMPARISON:  July 17, 2016 FINDINGS: There is no evidence of acute infiltrate, pleural effusion or pneumothorax. There is mild to moderate severity enlargement of the cardiac silhouette. The visualized skeletal structures are unremarkable. IMPRESSION: No active disease. Electronically Signed   By: Aram Candela M.D.   On: 07/14/2020 19:30   CT Renal Stone Study  Result Date:  07/14/2020 CLINICAL DATA:  Altered mental status with lower abdominal pain EXAM: CT ABDOMEN AND PELVIS WITHOUT CONTRAST TECHNIQUE: Multidetector  CT imaging of the abdomen and pelvis was performed following the standard protocol without IV contrast. COMPARISON:  2019 FINDINGS: Lower chest: Partially imaged bilateral lower lobe patchy density. Hepatobiliary: No focal liver abnormality is seen. No gallstones, gallbladder wall thickening, or biliary dilatation. Pancreas: Unremarkable. Spleen: Unremarkable. Adrenals/Urinary Tract: Adrenals are unremarkable. Decreased size of left lower pole renal cyst with peripheral hyperdensity, which may reflect mineralization. No renal calculi or hydronephrosis. Bladder is unremarkable. Stomach/Bowel: Stomach is within normal limits. Bowel is normal in caliber. Normal appendix. Vascular/Lymphatic: No significant vascular findings on this noncontrast study. No enlarged lymph nodes identified. Reproductive: Uterus and bilateral adnexa are unremarkable. Other: No ascites.  No acute abdominal wall abnormality. Musculoskeletal: No acute osseous abnormality. IMPRESSION: Partially imaged bilateral lower lobe patchy density reflecting atelectasis or pneumonia in the appropriate setting. No acute abnormality of the abdomen and pelvis. Electronically Signed   By: Guadlupe Spanish M.D.   On: 07/14/2020 20:33    Procedures Procedures (including critical care time)  Medications Ordered in ED Medications  sodium chloride flush (NS) 0.9 % injection 3 mL (3 mLs Intravenous Not Given 07/14/20 1840)  LORazepam (ATIVAN) injection 1 mg (1 mg Intravenous Not Given 07/14/20 1937)  ondansetron (ZOFRAN) injection 4 mg (has no administration in time range)  lactated ringers infusion ( Intravenous New Bag/Given 07/14/20 2127)  lactated ringers bolus 1,000 mL (0 mLs Intravenous Stopped 07/14/20 1959)  ceFEPIme (MAXIPIME) 2 g in sodium chloride 0.9 % 100 mL IVPB (0 g Intravenous Stopped 07/14/20 2047)   vancomycin (VANCOREADY) IVPB 1500 mg/300 mL (1,500 mg Intravenous New Bag/Given 07/14/20 2049)    ED Course  I have reviewed the triage vital signs and the nursing notes.  Pertinent labs & imaging results that were available during my care of the patient were reviewed by me and considered in my medical decision making (see chart for details).    MDM Rules/Calculators/A&P                          Kaylee Simpson is a 44 y.o. female with pmhx significant for HTN, who presents to the ED with AMS.  Per EMS patient was receiving breaths via BVM on arrival.  Patient received total of 2 mg Narcan with reported improvement.  Patient oriented only to name, birthday.  Patient denies drug use.  On arrival patient hypothermic to 92.2 rectally, tachycardic HR 120s, blood pressure stable. Bair hugger applied.   On exam patient appears clinically altered.  Patient with dried blood to face however no abrasion, evidence of trauma, lacerations noted.  No murmur appreciated on cardiac auscultation.  Lungs clear to auscultation all fields.  Abdomen soft, appears nontender.  No other evidence of trauma.  Differential is broad at this time includes infectious (encephalitis, meningitis, pneumonia, UTI, soft tissue), intoxication, metabolic encephalopathy, CNS insult (ICH, SAH, infarct), seizures, thyroid storm.  Broad screening labs obtained.  No leukocytosis.  Blood glucose 209.  CMP significant for elevated creatinine 1.59 (baseline appears to be ~0.6) concerning for AKI.  No leukocytosis on CBC.  Ethanol 96, UDS significant for THC, cocaine.  Salicylate, acetaminophen negative.  TSH WNL.  Initial lactic acidosis to 5.9, with improvement to 4.5 following liter bolus, LR infusion.  UA without concern for infection.  Blood cultures x2 pending.  Covid negative.  In the setting of possible trauma due to dried blood on face and AMS CT head and C-spine obtained that were not significant for acute trauma/intracranial abnormality  however significant for possible groundglass opacity in the lungs.  In the setting of hypothermia, AMS will initiate antibiotics.  CT of the abdomen/pelvis was completed to rule out intra-abdominal pathology leading to presentation and was without significant findings however redemonstrated possible lung opacity.  On reassessment pt appeared more alert, however confused about events leading to presentation Denied opioid use. Patient updated on findings and need for admission. Patient in agreement with plan. Altered mental status probably multifactorial including substance use (alcohol, cocaine, THC) worsened by possible underlying infection (pneumonia) and AKI.  Will admit patient for further IV antibiotics, fluid rehydration. Patient admitted in stable condition.   Patient treatment plan discussed with and medical decision making supervised by my attending physician Dr. Silverio LayYao.   Final Clinical Impression(s) / ED Diagnoses Final diagnoses:  Hypothermia, initial encounter  Community acquired pneumonia, unspecified laterality  Altered mental status, unspecified altered mental status type  AKI (acute kidney injury) [facility](HCC)    Rx / DC Orders ED Discharge Orders    None       Golden PopPapier, Katesha Eichel, MD 07/14/20 2318    Golden PopPapier, Aislee Landgren, MD 07/14/20 2320    Charlynne PanderYao, David Hsienta, MD 07/15/20 1747

## 2020-07-14 NOTE — Progress Notes (Signed)
Pharmacy Antibiotic Note  Kaylee Simpson is a 44 y.o. female admitted on 07/14/2020 with asp pna.  Pharmacy has been consulted for Unasyn dosing.  Plan: Unasyn 3gm IV q8h Will f/u renal function, micro data, and pt's clinical condition  Height: 5\' 5"  (165.1 cm) Weight: 77.1 kg (170 lb) IBW/kg (Calculated) : 57  Temp (24hrs), Avg:94.2 F (34.6 C), Min:92.2 F (33.4 C), Max:96.7 F (35.9 C)  Recent Labs  Lab 07/14/20 1742 07/14/20 1825 07/14/20 2019  WBC 10.5  --   --   CREATININE 1.95*  --   --   LATICACIDVEN  --  5.9* 4.5*    Estimated Creatinine Clearance: 37.8 mL/min (A) (by C-G formula based on SCr of 1.95 mg/dL (H)).    No Known Allergies  Antimicrobials this admission: 7/16 Cefepime x 1 7/17 Unasyn >>    Microbiology results: 7/17 BCx:    Thank you for allowing pharmacy to be a part of this patient's care.  8/17, PharmD, BCPS Please see amion for complete clinical pharmacist phone list 07/14/2020 11:37 PM

## 2020-07-14 NOTE — ED Notes (Signed)
Pt able to void and provide urine sample via clean catch

## 2020-07-14 NOTE — ED Notes (Signed)
Pt placed on bear hugger.  

## 2020-07-14 NOTE — H&P (Signed)
History and Physical    Kaylee FillCorliss Stettler BJY:[id number] DOB: 08/27/1976 DOA: 07/14/2020  PCP: Fleet ContrasAvbuere, Edwin, MD  Patient coming from: near home  I have personally briefly reviewed patient's old medical records in [facility]  Chief Complaint: Altered mental status  HPI: Kaylee Simpson is a 44 y.o. female with no known medical history who presents with concerns of altered mental status.  Patient reportedly was found by a neighbor and was unconscious.  Patient does not recall any of this event.  She only remembers that yesterday she went to a cook out and was drinking alcohol and also did cocaine and marijuana.  She questions whether the cocaine had other drugs that she was unaware of.  States she normally drinks about 4 beers a day.  States last week she also had some cold symptoms with sore throat but took DayQuil and symptoms resolved.  Reportedly she was bagged by the fire department and EMS administered Narcan 1 mg nasal and IV dose in the field and she had some improvement to her mental status on arrival to the ED.  She was noted to be hypothermic down to 90 3.46F, tachycardic up to 120, hypotensive down to systolic of 90s over 60s.  She was nauseous and episodes of emesis while in the ED.  Also notes some chest pain worse with deep respiration but denies any shortness of breath.  No leukocytosis.  Lactate of 5.9. Creatinine of 1.95. CT head negative CT cervical spine negative CT renal stone was negative but did show some bilateral lower extremity patchy opacity concerning for pneumonia  She was given IV vancomycin and cefepime in the ED.   Review of Systems:  Constitutional: No Weight Change, No Fever ENT/Mouth: No sore throat, No Rhinorrhea Eyes: No Eye Pain, No Vision Changes Cardiovascular: +Chest Pain, no SOB Respiratory: No Cough, No Sputum, No Wheezing, no Dyspnea  Gastrointestinal:+ Nausea, + Vomiting,  No Pain Genitourinary: no Urinary Incontinence,  Musculoskeletal:  No Arthralgias, No Myalgias Skin: No Skin Lesions, No Pruritus, Neuro: no Weakness, No Numbness,  + Loss of Consciousness, No Syncope Psych: No Anxiety/Panic, No Depression, no decrease appetite Heme/Lymph: No Bruising, No Bleeding  Past Medical History:  Diagnosis Date  . Complication of anesthesia   . Hypertension    not a problem  . PONV (postoperative nausea and vomiting)   . Pyelonephritis "several times"    Past Surgical History:  Procedure Laterality Date  . DILATION AND CURETTAGE OF UTERUS  1997; 1998  . FRACTURE SURGERY    . ORIF ANKLE FRACTURE Left 04/16/2016   Procedure: OPEN REDUCTION INTERNAL FIXATION (ORIF) LEFT BIMALLEOLAR ANKLE FRACTURE WITH SYNDESMOSIS SCREW;  Surgeon: Eldred MangesMark C Yates, MD;  Location: [facility] OR;  Service: Orthopedics;  Laterality: Left;  . ORIF ANKLE FRACTURE BIMALLEOLAR Left 04/16/2016   ORIF syndesmosis.  Marland Kitchen. PERCUTANEOUS PINNING TOE FRACTURE Right 2011   great toe and middle     reports that she has been smoking cigarettes. She has a 2.50 pack-year smoking history. She uses smokeless tobacco. She reports current alcohol use of about 6.0 standard drinks of alcohol per week. She reports that she does not use drugs.  No Known Allergies     Prior to Admission medications   Medication Sig Start Date End Date Taking? Authorizing Provider  acetaminophen (TYLENOL) 500 MG tablet Take 1,000 mg by mouth 3 (three) times daily as needed for headache (pain).    Yes [provider]  diclofenac sodium (VOLTAREN) 1 % GEL Apply 2  g topically 4 (four) times daily. Patient not taking: Reported on 07/14/2020 12/10/16   Cammy Copa, MD  HYDROcodone-acetaminophen [facility]) 5-325 MG tablet Take 1 tablet by mouth every 6 (six) hours as needed for moderate pain. Patient not taking: Reported on 07/14/2020 12/05/19   Lenda Kelp, MD  ibuprofen (ADVIL) 600 MG tablet Take 1 tablet (600 mg total) by mouth every 8 (eight) hours as needed. Patient not taking:  Reported on 07/14/2020 11/28/19   Myra Rude, MD  meloxicam (MOBIC) 15 MG tablet Take 1 tablet (15 mg total) by mouth daily. Patient not taking: Reported on 07/14/2020 12/05/19   Lenda Kelp, MD  Misc. Devices (FREE SPIRIT KNEE/LEG WALKER) MISC 1 Device by Does not apply route once. 04/11/16   Lyndal Pulley, MD    Physical Exam: Vitals:   07/14/20 2045 07/14/20 2130 07/14/20 2200 07/14/20 2304  BP: 93/69 123/81 95/71   Pulse: (!) 125 (!) 122 (!) 122   Resp: 15 16 14    Temp:    (!) 96.7 F (35.9 C)  TempSrc:    Rectal  SpO2: (!) 87% 92% 90%   Weight:      Height:        Constitutional: NAD, calm, comfortable, mildly ill-appearing female sitting upright in bed Vitals:   07/14/20 2045 07/14/20 2130 07/14/20 2200 07/14/20 2304  BP: 93/69 123/81 95/71   Pulse: (!) 125 (!) 122 (!) 122   Resp: 15 16 14    Temp:    (!) 96.7 F (35.9 C)  TempSrc:    Rectal  SpO2: (!) 87% 92% 90%   Weight:      Height:       Eyes: PERRL, lids and conjunctivae normal ENMT: Mucous membranes are moist. Posterior pharynx clear of any exudate or lesions.Normal dentition.  Neck: normal, supple Respiratory: clear to auscultation bilaterally, no wheezing, no crackles. Normal respiratory effort. No accessory muscle use.  Cardiovascular: Regular rate and rhythm, no murmurs / rubs / gallops. No extremity edema.   Abdomen: no tenderness, no masses palpated.Bowel sounds positive.  Musculoskeletal: no clubbing / cyanosis. No joint deformity upper and lower extremities. Good ROM, no contractures. Normal muscle tone.  Skin: no rashes, lesions, ulcers. No induration Neurologic: Patient unaware of the events surrounding her admission but was fully alert and oriented x3 during evaluation.  CN 2-12 grossly intact. Sensation intact. Strength 5/5 in all 4.  Psychiatric: Normal judgment and insight. Alert and oriented x 3. Normal mood.     Labs on Admission: I have personally reviewed following labs and imaging  studies  CBC: Recent Labs  Lab 07/14/20 1742  WBC 10.5  HGB 15.1*  HCT 50.3*  MCV 106.1*  PLT 279   Basic Metabolic Panel: Recent Labs  Lab 07/14/20 1742  NA 143  K 4.1  CL 108  CO2 18*  GLUCOSE 181*  BUN 20  CREATININE 1.95*  CALCIUM 9.1   GFR: Estimated Creatinine Clearance: 37.8 mL/min (A) (by C-G formula based on SCr of 1.95 mg/dL (H)). Liver Function Tests: Recent Labs  Lab 07/14/20 1742  AST 68*  ALT 40  ALKPHOS 71  BILITOT <0.1*  PROT 7.5  ALBUMIN 3.7   No results for input(s): LIPASE, AMYLASE in the last 168 hours. No results for input(s): AMMONIA in the last 168 hours. Coagulation Profile: No results for input(s): INR, PROTIME in the last 168 hours. Cardiac Enzymes: No results for input(s): CKTOTAL, CKMB, CKMBINDEX, TROPONINI in the last 168 hours. BNP (  last 3 results) No results for input(s): PROBNP in the last 8760 hours. HbA1C: No results for input(s): HGBA1C in the last 72 hours. CBG: Recent Labs  Lab 07/14/20 1729  GLUCAP 209*   Lipid Profile: No results for input(s): CHOL, HDL, LDLCALC, TRIG, CHOLHDL, LDLDIRECT in the last 72 hours. Thyroid Function Tests: Recent Labs    07/14/20 1822  TSH 4.258   Anemia Panel: No results for input(s): VITAMINB12, FOLATE, FERRITIN, TIBC, IRON, RETICCTPCT in the last 72 hours. Urine analysis:    Component Value Date/Time   COLORURINE YELLOW 07/14/2020 2001   APPEARANCEUR HAZY (A) 07/14/2020 2001   LABSPEC 1.011 07/14/2020 2001   PHURINE 6.0 07/14/2020 2001   GLUCOSEU NEGATIVE 07/14/2020 2001   HGBUR LARGE (A) 07/14/2020 2001   BILIRUBINUR NEGATIVE 07/14/2020 2001   KETONESUR NEGATIVE 07/14/2020 2001   PROTEINUR 100 (A) 07/14/2020 2001   UROBILINOGEN 1.0 07/10/2015 1751   NITRITE NEGATIVE 07/14/2020 2001   LEUKOCYTESUR TRACE (A) 07/14/2020 2001    Radiological Exams on Admission: CT Head Wo Contrast  Result Date: 07/14/2020 CLINICAL DATA:  Found unresponsive, altered mental status EXAM:  CT HEAD WITHOUT CONTRAST CT CERVICAL SPINE WITHOUT CONTRAST TECHNIQUE: Multidetector CT imaging of the head and cervical spine was performed following the standard protocol without intravenous contrast. Multiplanar CT image reconstructions of the cervical spine were also generated. COMPARISON:  CT head 07/11/2015 FINDINGS: CT HEAD FINDINGS Brain: No evidence of acute infarction, hemorrhage, hydrocephalus, extra-axial collection or mass lesion/mass effect. Vascular: No hyperdense vessel or unexpected calcification. Skull: No calvarial fracture or suspicious osseous lesion. No scalp swelling or hematoma. Sinuses/Orbits: Minimal mural thickening in the ethmoids and maxillary sinuses. Included orbital structures are unremarkable. Other: None. CT CERVICAL SPINE FINDINGS Alignment: Stabilization collar absent. Reversal the normal cervical lordosis centered at the C3-4 level. No evidence of traumatic listhesis. No abnormally widened, perched or jumped facets. Normal alignment of the craniocervical and atlantoaxial articulations. Skull base and vertebrae: Tiny corticated ossification adjacent the tip of the dens is likely degenerative in nature. No convincing acute skull base fracture, vertebral body fracture or height loss. Normal bone mineralization. No worrisome osseous lesions. Minimal spondylitic changes detailed below. Soft tissues and spinal canal: No pre or paravertebral fluid or swelling. No visible canal hematoma. Subglottic secretions are noted in the trachea. Disc levels: Minimal diffuse mild intervertebral disc height loss with early spondylitic changes. Aimed maximal disc osteophyte complexes present at C3-4 resulting in at most mild canal stenosis. No other significant canal stenosis or foraminal impingement within the imaged levels of the spine. Upper chest: Some dependent ground-glass opacity noted in the lungs could reflect atelectatic change or developing edema. Other: No concerning thyroid nodules.  IMPRESSION: 1. No acute intracranial abnormality. 2. No acute cervical spine fracture or traumatic listhesis. 3. Reversal of the normal cervical lordosis centered at the C3-4 level, possibly positional, degenerative or related to muscle spasm. 4. Subglottic secretions are noted in the trachea. 5. Some dependent ground-glass opacity in the lungs could reflect atelectatic change or developing edema. Electronically Signed   By: Kreg Shropshire M.D.   On: 07/14/2020 18:20   CT Cervical Spine Wo Contrast  Result Date: 07/14/2020 CLINICAL DATA:  Found unresponsive, altered mental status EXAM: CT HEAD WITHOUT CONTRAST CT CERVICAL SPINE WITHOUT CONTRAST TECHNIQUE: Multidetector CT imaging of the head and cervical spine was performed following the standard protocol without intravenous contrast. Multiplanar CT image reconstructions of the cervical spine were also generated. COMPARISON:  CT head 07/11/2015 FINDINGS: CT  HEAD FINDINGS Brain: No evidence of acute infarction, hemorrhage, hydrocephalus, extra-axial collection or mass lesion/mass effect. Vascular: No hyperdense vessel or unexpected calcification. Skull: No calvarial fracture or suspicious osseous lesion. No scalp swelling or hematoma. Sinuses/Orbits: Minimal mural thickening in the ethmoids and maxillary sinuses. Included orbital structures are unremarkable. Other: None. CT CERVICAL SPINE FINDINGS Alignment: Stabilization collar absent. Reversal the normal cervical lordosis centered at the C3-4 level. No evidence of traumatic listhesis. No abnormally widened, perched or jumped facets. Normal alignment of the craniocervical and atlantoaxial articulations. Skull base and vertebrae: Tiny corticated ossification adjacent the tip of the dens is likely degenerative in nature. No convincing acute skull base fracture, vertebral body fracture or height loss. Normal bone mineralization. No worrisome osseous lesions. Minimal spondylitic changes detailed below. Soft tissues  and spinal canal: No pre or paravertebral fluid or swelling. No visible canal hematoma. Subglottic secretions are noted in the trachea. Disc levels: Minimal diffuse mild intervertebral disc height loss with early spondylitic changes. Aimed maximal disc osteophyte complexes present at C3-4 resulting in at most mild canal stenosis. No other significant canal stenosis or foraminal impingement within the imaged levels of the spine. Upper chest: Some dependent ground-glass opacity noted in the lungs could reflect atelectatic change or developing edema. Other: No concerning thyroid nodules. IMPRESSION: 1. No acute intracranial abnormality. 2. No acute cervical spine fracture or traumatic listhesis. 3. Reversal of the normal cervical lordosis centered at the C3-4 level, possibly positional, degenerative or related to muscle spasm. 4. Subglottic secretions are noted in the trachea. 5. Some dependent ground-glass opacity in the lungs could reflect atelectatic change or developing edema. Electronically Signed   By: Kreg Shropshire M.D.   On: 07/14/2020 18:20   DG Chest Portable 1 View  Result Date: 07/14/2020 CLINICAL DATA:  Altered mental status. EXAM: PORTABLE CHEST 1 VIEW COMPARISON:  July 17, 2016 FINDINGS: There is no evidence of acute infiltrate, pleural effusion or pneumothorax. There is mild to moderate severity enlargement of the cardiac silhouette. The visualized skeletal structures are unremarkable. IMPRESSION: No active disease. Electronically Signed   By: Aram Candela M.D.   On: 07/14/2020 19:30   CT Renal Stone Study  Result Date: 07/14/2020 CLINICAL DATA:  Altered mental status with lower abdominal pain EXAM: CT ABDOMEN AND PELVIS WITHOUT CONTRAST TECHNIQUE: Multidetector CT imaging of the abdomen and pelvis was performed following the standard protocol without IV contrast. COMPARISON:  2019 FINDINGS: Lower chest: Partially imaged bilateral lower lobe patchy density. Hepatobiliary: No focal liver  abnormality is seen. No gallstones, gallbladder wall thickening, or biliary dilatation. Pancreas: Unremarkable. Spleen: Unremarkable. Adrenals/Urinary Tract: Adrenals are unremarkable. Decreased size of left lower pole renal cyst with peripheral hyperdensity, which may reflect mineralization. No renal calculi or hydronephrosis. Bladder is unremarkable. Stomach/Bowel: Stomach is within normal limits. Bowel is normal in caliber. Normal appendix. Vascular/Lymphatic: No significant vascular findings on this noncontrast study. No enlarged lymph nodes identified. Reproductive: Uterus and bilateral adnexa are unremarkable. Other: No ascites.  No acute abdominal wall abnormality. Musculoskeletal: No acute osseous abnormality. IMPRESSION: Partially imaged bilateral lower lobe patchy density reflecting atelectasis or pneumonia in the appropriate setting. No acute abnormality of the abdomen and pelvis. Electronically Signed   By: Guadlupe Spanish M.D.   On: 07/14/2020 20:33      Assessment/Plan  Acute metabolic encephalopathy secondary to suspected drug overdose/hypothermia/potential sepsis Received Narcan by EMS with improvement in mental status Suspect temperature dysregulation, hypotension and tachycardia likely secondary to drug use and subsequent pneumonia caused  by aspiration Continuous IV fluids.  Continue to trend lactate. Continue Bair hugger  Sepsis secondary to Aspiration pneumonia Patient is she started on IV vancomycin and cefepime in ED.  Will switch to IV Unasyn and can likely transition to p.o. in the morning  Polysubstance abuse (EtOH, cocaine, MJ) CIWA protocol  Patient is concerned that she could have died with her drug use this time.  She is willing to quit. TOC team consulted for resources.   DVT prophylaxis:.Lovenox Code Status: Full Family Communication: Plan discussed with patient at bedside  disposition Plan: Home with observation Consults called:  Admission status:  Observation   Status is: Observation  The patient remains OBS appropriate and will d/c before 2 midnights.  Dispo: The patient is from: Home              Anticipated d/c is to: Home              Anticipated d/c date is: 1 day              Patient currently is not medically stable to d/c.         Anselm Jungling DO [facility]   If 7PM-7AM, please contact night-coverage [email or web address]   07/14/2020, 11:30 PM

## 2020-07-14 NOTE — ED Notes (Signed)
Patient transported to CT 

## 2020-07-14 NOTE — ED Triage Notes (Signed)
Pt to ED via [facility] from neighbor's house.  Pt walked to neighbors house and they called 911.  On EMS arrival pt was being bagged by fire dept.  EMS unsure if pt fell.  EMS administered Narcan 1mg  nasal and 1mg  IV.  Pt alert on arrival.  Oriented to name and birth date.  Dried blood to face/mouth.  Pt denies pain.  Asking what happened.  Denies drug use.

## 2020-07-15 ENCOUNTER — Inpatient Hospital Stay (HOSPITAL_COMMUNITY): Payer: Medicaid Other

## 2020-07-15 ENCOUNTER — Observation Stay (HOSPITAL_COMMUNITY): Payer: Medicaid Other

## 2020-07-15 DIAGNOSIS — A419 Sepsis, unspecified organism: Secondary | ICD-10-CM

## 2020-07-15 DIAGNOSIS — F1210 Cannabis abuse, uncomplicated: Secondary | ICD-10-CM | POA: Diagnosis not present

## 2020-07-15 DIAGNOSIS — N179 Acute kidney failure, unspecified: Secondary | ICD-10-CM | POA: Diagnosis not present

## 2020-07-15 DIAGNOSIS — R918 Other nonspecific abnormal finding of lung field: Secondary | ICD-10-CM | POA: Diagnosis not present

## 2020-07-15 DIAGNOSIS — R680 Hypothermia, not associated with low environmental temperature: Secondary | ICD-10-CM | POA: Diagnosis not present

## 2020-07-15 DIAGNOSIS — F17210 Nicotine dependence, cigarettes, uncomplicated: Secondary | ICD-10-CM | POA: Diagnosis not present

## 2020-07-15 DIAGNOSIS — F1910 Other psychoactive substance abuse, uncomplicated: Secondary | ICD-10-CM

## 2020-07-15 DIAGNOSIS — G9341 Metabolic encephalopathy: Secondary | ICD-10-CM | POA: Diagnosis not present

## 2020-07-15 DIAGNOSIS — J690 Pneumonitis due to inhalation of food and vomit: Secondary | ICD-10-CM | POA: Diagnosis not present

## 2020-07-15 DIAGNOSIS — F1010 Alcohol abuse, uncomplicated: Secondary | ICD-10-CM | POA: Diagnosis not present

## 2020-07-15 DIAGNOSIS — T68XXXA Hypothermia, initial encounter: Secondary | ICD-10-CM | POA: Diagnosis not present

## 2020-07-15 DIAGNOSIS — R079 Chest pain, unspecified: Secondary | ICD-10-CM | POA: Diagnosis not present

## 2020-07-15 DIAGNOSIS — N281 Cyst of kidney, acquired: Secondary | ICD-10-CM | POA: Diagnosis not present

## 2020-07-15 DIAGNOSIS — J189 Pneumonia, unspecified organism: Secondary | ICD-10-CM | POA: Diagnosis not present

## 2020-07-15 DIAGNOSIS — I4891 Unspecified atrial fibrillation: Secondary | ICD-10-CM

## 2020-07-15 DIAGNOSIS — T50901A Poisoning by unspecified drugs, medicaments and biological substances, accidental (unintentional), initial encounter: Secondary | ICD-10-CM

## 2020-07-15 DIAGNOSIS — R4020 Unspecified coma: Secondary | ICD-10-CM | POA: Diagnosis not present

## 2020-07-15 DIAGNOSIS — Z20822 Contact with and (suspected) exposure to covid-19: Secondary | ICD-10-CM | POA: Diagnosis not present

## 2020-07-15 DIAGNOSIS — M4802 Spinal stenosis, cervical region: Secondary | ICD-10-CM | POA: Diagnosis not present

## 2020-07-15 DIAGNOSIS — F1410 Cocaine abuse, uncomplicated: Secondary | ICD-10-CM | POA: Diagnosis not present

## 2020-07-15 DIAGNOSIS — I517 Cardiomegaly: Secondary | ICD-10-CM | POA: Diagnosis not present

## 2020-07-15 DIAGNOSIS — R4182 Altered mental status, unspecified: Secondary | ICD-10-CM | POA: Diagnosis not present

## 2020-07-15 DIAGNOSIS — I10 Essential (primary) hypertension: Secondary | ICD-10-CM | POA: Diagnosis not present

## 2020-07-15 HISTORY — DX: Sepsis, unspecified organism: A41.9

## 2020-07-15 HISTORY — DX: Pneumonia, unspecified organism: J18.9

## 2020-07-15 HISTORY — DX: Poisoning by unspecified drugs, medicaments and biological substances, accidental (unintentional), initial encounter: T50.901A

## 2020-07-15 HISTORY — DX: Metabolic encephalopathy: G93.41

## 2020-07-15 LAB — BASIC METABOLIC PANEL
Anion gap: 9 (ref 5–15)
BUN: 20 mg/dL (ref 6–20)
CO2: 22 mmol/L (ref 22–32)
Calcium: 8.5 mg/dL — ABNORMAL LOW (ref 8.9–10.3)
Chloride: 110 mmol/L (ref 98–111)
Creatinine, Ser: 1.03 mg/dL — ABNORMAL HIGH (ref 0.44–1.00)
GFR calc Af Amer: >60 mL/min (ref >60)
GFR calc non Af Amer: >60 mL/min (ref >60)
Glucose, Bld: 77 mg/dL (ref 70–99)
Potassium: 4.2 mmol/L (ref 3.5–5.1)
Sodium: 141 mmol/L (ref 135–145)

## 2020-07-15 LAB — CBC
HCT: 43.4 % (ref 36.0–46.0)
Hemoglobin: 13.2 g/dL (ref 12.0–15.0)
MCH: 31.1 pg (ref 26.0–34.0)
MCHC: 30.4 g/dL (ref 30.0–36.0)
MCV: 102.1 fL — ABNORMAL HIGH (ref 80.0–100.0)
Platelets: 234 10*3/uL (ref 150–400)
RBC: 4.25 MIL/uL (ref 3.87–5.11)
RDW: 13.6 % (ref 11.5–15.5)
WBC: 16.5 10*3/uL — ABNORMAL HIGH (ref 4.0–10.5)
nRBC: 0 % (ref 0.0–0.2)

## 2020-07-15 LAB — ECHOCARDIOGRAM COMPLETE
Area-P 1/2: 3.12 cm2
Height: 65 in
S' Lateral: 2.35 cm
Weight: 2720 oz

## 2020-07-15 LAB — TROPONIN I (HIGH SENSITIVITY)
Troponin I (High Sensitivity): 421 ng/L (ref <18)
Troponin I (High Sensitivity): 430 ng/L (ref <18)

## 2020-07-15 LAB — HIV ANTIBODY (ROUTINE TESTING W REFLEX): HIV Screen 4th Generation wRfx: NONREACTIVE

## 2020-07-15 MED ORDER — DIPHENHYDRAMINE HCL 50 MG/ML IJ SOLN
25.0000 mg | Freq: Once | INTRAMUSCULAR | Status: AC
  Administered 2020-07-15: 25 mg via INTRAVENOUS
  Filled 2020-07-15: qty 1

## 2020-07-15 MED ORDER — ONDANSETRON HCL 4 MG/2ML IJ SOLN
4.0000 mg | Freq: Four times a day (QID) | INTRAMUSCULAR | Status: DC | PRN
  Administered 2020-07-15 (×2): 4 mg via INTRAVENOUS
  Filled 2020-07-15 (×2): qty 2

## 2020-07-15 MED ORDER — IOHEXOL 350 MG/ML SOLN
63.0000 mL | Freq: Once | INTRAVENOUS | Status: AC | PRN
  Administered 2020-07-15: 63 mL via INTRAVENOUS

## 2020-07-15 MED ORDER — ACETAMINOPHEN 325 MG PO TABS
650.0000 mg | ORAL_TABLET | Freq: Four times a day (QID) | ORAL | Status: DC | PRN
  Administered 2020-07-15 – 2020-07-16 (×3): 650 mg via ORAL
  Filled 2020-07-15 (×4): qty 2

## 2020-07-15 NOTE — Progress Notes (Signed)
°  Echocardiogram 2D Echocardiogram has been performed.  [name] 07/15/2020, 11:12 AM

## 2020-07-15 NOTE — Progress Notes (Signed)
PROGRESS NOTE                                                                                                                                                                                                             Patient Demographics:    Kaylee Simpson, is a 44 y.o. female, DOB - 1976/11/05, ZOX:[id number]  Admit date - 07/14/2020   Admitting Physician Anselm Jungling, DO  Outpatient Primary MD for the patient is Fleet Contras, MD  LOS - 0   Chief Complaint  Patient presents with  . Altered Mental Status       Brief Narrative    HPI:  Kaylee Simpson is a 44 y.o. female with no known medical history who presents with concerns of altered mental status. Patient reportedly was found by a neighbor and was unconscious.  Patient does not recall any of this event.  She only remembers that yesterday she went to a cook out and was drinking alcohol and also did cocaine and marijuana.  She questions whether the cocaine had other drugs that she was unaware of.  States she normally drinks about 4 beers a day.  States last week she also had some cold symptoms with sore throat but took DayQuil and symptoms resolved. Reportedly she was bagged by the fire department and EMS administered Narcan 1 mg nasal and IV dose in the field and she had some improvement to her mental status on arrival to the ED.  She was noted to be hypothermic down to 90 3.31F, tachycardic up to 120, hypotensive down to systolic of 90s over 60s.  She was nauseous and episodes of emesis while in the ED.  Also notes some chest pain worse with deep respiration but denies any shortness of breath. No leukocytosis.  Lactate of 5.9. Creatinine of 1.95. CT head negative CT cervical spine negative CT renal stone was negative but did show some bilateral lower extremity patchy opacity concerning for pneumonia She was given IV vancomycin and cefepime in the ED.  Patient mentation has improved, back to  baseline, did report some intermittent chest pressure, will she had some cardiomegaly on imaging, so EKG was obtained, which was nonacute, her high-sensitivity troponins were elevated at 421>>430, 2D echo was obtained, significant for grade 1 diastolic dysfunction, stable EF 65 to 70%, severe left ventricular hypertrophy, there right  heart has been moderately enlarged, with reduced function.     Subjective:    Kaylee Simpson today did report some chest pressure earlier today, she does report some cough, and mild dyspnea.   Assessment  & Plan :    Active Problems:   Drug overdose, accidental or unintentional, initial encounter   Acute metabolic encephalopathy   Sepsis (HCC)   Aspiration pneumonia of both lower lobes due to gastric secretions (HCC)   Polysubstance abuse (HCC)   Drug overdose    Acute metabolic encephalopathy secondary to suspected drug overdose/hypothermia/potential sepsis - Received Narcan by EMS with improvement in mental status - Suspect temperature dysregulation, hypotension and tachycardia likely secondary to drug use and subsequent pneumonia caused by aspiration -Hypothermia has resolved, mentation has resolved and back to baseline.  Sepsis secondary to Aspiration pneumonia -Patient reports cough, some mild dyspnea, empirically on vancomycin and cefepime by ED, she is currently transitioned to IV Unasyn for aspiration pneumonia .  Chest pain with elevated troponins -This is unrelated to MRI, this is mostly in the setting of mild ischemia from her cocaine, no regional wall motion abnormalities on echo. -Patient has reduced right ventricular function with dilation, she did present with consciousness, so we will need to rule out PE, have ordered stat CTA chest to rule out PE.  Polysubstance abuse (EtOH, cocaine, MJ) CIWA protocol  -He was counseled, she is willing to quit.   COVID-19 Labs  No results for input(s): DDIMER, FERRITIN, LDH, CRP in the last 72  hours.  Lab Results  Component Value Date   SARSCOV2NAA NEGATIVE 07/14/2020   SARSCOV2NAA Not Detected 12/15/2019     Code Status : Full   Family Communication  : none at bedside  Disposition Plan  :  Status is: Inpatient  Remains inpatient appropriate because:Hemodynamically unstable and Unsafe d/c plan   Dispo: The patient is from: Home              Anticipated d/c is to: Home              Anticipated d/c date is: 1 day              Patient currently is not medically stable to d/c.  Patient with elevated troponins, need to further work-up to rule out PE, on IV Unasyn for aspiration pneumonia, remains on IV fluids.       Consults  : None  Procedures  : None  DVT Prophylaxis  :  [place] lovenox  Lab Results  Component Value Date   PLT 234 07/15/2020    Antibiotics  :    Anti-infectives (From admission, onward)   Start     Dose/Rate Route Frequency Ordered Stop   07/15/20 0000  Ampicillin-Sulbactam (UNASYN) 3 g in sodium chloride 0.9 % 100 mL IVPB     Discontinue     3 g 200 mL/hr over 30 Minutes Intravenous Every 8 hours 07/14/20 2339     07/14/20 2015  vancomycin (VANCOREADY) IVPB 1500 mg/300 mL        1,500 mg 150 mL/hr over 120 Minutes Intravenous  Once 07/14/20 2001 07/14/20 2325   07/14/20 1945  vancomycin (VANCOCIN) IVPB 1000 mg/200 mL premix  Status:  Discontinued        1,000 mg 200 mL/hr over 60 Minutes Intravenous  Once 07/14/20 1941 07/14/20 2001   07/14/20 1945  ceFEPIme (MAXIPIME) 2 g in sodium chloride 0.9 % 100 mL IVPB        2  g 200 mL/hr over 30 Minutes Intravenous  Once 07/14/20 1941 07/14/20 2047        Objective:   Vitals:   07/15/20 0100 07/15/20 0112 07/15/20 0225 07/15/20 0400  BP: 110/70  118/86 129/81  Pulse: 86  85 79  Resp: 14  20 11   Temp:  (!) 97.3 F (36.3 C) 97.9 F (36.6 C)   TempSrc:  Rectal Oral   SpO2: 95%  95% (!) 89%  Weight:      Height:        Wt Readings from Last 3 Encounters:  07/14/20 77.1 kg  12/05/19  85.3 kg  09/07/18 82.1 kg     Intake/Output Summary (Last 24 hours) at 07/15/2020 1420 Last data filed at 07/15/2020 0135 Gross per 24 hour  Intake 1167.11 ml  Output --  Net 1167.11 ml     Physical Exam  Awake Alert, Oriented X 3, No new F.N deficits, Normal affect Symmetrical Chest wall movement, Good air movement bilaterally, CTAB RRR,No Gallops,Rubs or new Murmurs, No Parasternal Heave +ve B.Sounds, Abd Soft, No tenderness, No rebound - guarding or rigidity. No Cyanosis, Clubbing or edema, No new Rash or bruise    Patient was seen and examined by the the presence of her RN 07/17/2020    Data Review:    CBC Recent Labs  Lab 07/14/20 1742 07/15/20 0111  WBC 10.5 16.5*  HGB 15.1* 13.2  HCT 50.3* 43.4  PLT 279 234  MCV 106.1* 102.1*  MCH 31.9 31.1  MCHC 30.0 30.4  RDW 13.7 13.6    Chemistries  Recent Labs  Lab 07/14/20 1742 07/15/20 0111  NA 143 141  K 4.1 4.2  CL 108 110  CO2 18* 22  GLUCOSE 181* 77  BUN 20 20  CREATININE 1.95* 1.03*  CALCIUM 9.1 8.5*  AST 68*  --   ALT 40  --   ALKPHOS 71  --   BILITOT <0.1*  --    ------------------------------------------------------------------------------------------------------------------ No results for input(s): CHOL, HDL, LDLCALC, TRIG, CHOLHDL, LDLDIRECT in the last 72 hours.  No results found for: HGBA1C ------------------------------------------------------------------------------------------------------------------ Recent Labs    07/14/20 1822  TSH 4.258   ------------------------------------------------------------------------------------------------------------------ No results for input(s): VITAMINB12, FOLATE, FERRITIN, TIBC, IRON, RETICCTPCT in the last 72 hours.  Coagulation profile No results for input(s): INR, PROTIME in the last 168 hours.  No results for input(s): DDIMER in the last 72 hours.  Cardiac Enzymes No results for input(s): CKMB, TROPONINI, MYOGLOBIN in the last 168  hours.  Invalid input(s): CK ------------------------------------------------------------------------------------------------------------------ No results found for: BNP  Inpatient Medications  Scheduled Meds: . enoxaparin (LOVENOX) injection  40 mg Subcutaneous Q24H  . folic acid  1 mg Oral Daily  . LORazepam  1 mg Intravenous Once  . multivitamin with minerals  1 tablet Oral Daily  . sodium chloride flush  3 mL Intravenous Once  . thiamine  100 mg Oral Daily   Or  . thiamine  100 mg Intravenous Daily   Continuous Infusions: . ampicillin-sulbactam (UNASYN) IV 3 g (07/15/20 0803)  . lactated ringers 125 mL/hr at 07/15/20 0706   PRN Meds:.acetaminophen, LORazepam **OR** LORazepam, ondansetron (ZOFRAN) IV  Micro Results Recent Results (from the past 240 hour(s))  Blood culture (routine x 2)     Status: None (Preliminary result)   Collection Time: 07/14/20  6:19 PM   Specimen: BLOOD  Result Value Ref Range Status   Specimen Description BLOOD RIGHT ANTECUBITAL  Final   Special Requests   Final  BOTTLES DRAWN AEROBIC AND ANAEROBIC Blood Culture results may not be optimal due to an inadequate volume of blood received in culture bottles   Culture   Final    NO GROWTH < 24 HOURS Performed at [facility], [address] [place], [place] [address]    Report Status PENDING  Incomplete  Blood culture (routine x 2)     Status: None (Preliminary result)   Collection Time: 07/14/20  6:27 PM   Specimen: BLOOD  Result Value Ref Range Status   Specimen Description BLOOD LEFT ANTECUBITAL  Final   Special Requests   Final    BOTTLES DRAWN AEROBIC AND ANAEROBIC Blood Culture adequate volume   Culture   Final    NO GROWTH < 24 HOURS Performed at [facility], [address] [place], [place] [address]    Report Status PENDING  Incomplete  SARS Coronavirus 2 by RT PCR (hospital order, performed in [facility] hospital lab) Nasopharyngeal Nasopharyngeal Swab     Status: None    Collection Time: 07/14/20  9:29 PM   Specimen: Nasopharyngeal Swab  Result Value Ref Range Status   SARS Coronavirus 2 NEGATIVE NEGATIVE Final    Comment: (NOTE) SARS-CoV-2 target nucleic acids are NOT DETECTED.  The SARS-CoV-2 RNA is generally detectable in upper and lower respiratory specimens during the acute phase of infection. The lowest concentration of SARS-CoV-2 viral copies this assay can detect is 250 copies / mL. A negative result does not preclude SARS-CoV-2 infection and should not be used as the sole basis for treatment or other patient management decisions.  A negative result may occur with improper specimen collection / handling, submission of specimen other than nasopharyngeal swab, presence of viral mutation(s) within the areas targeted by this assay, and inadequate number of viral copies (<250 copies / mL). A negative result must be combined with clinical observations, patient history, and epidemiological information.  Fact Sheet for Patients:   BoilerBrush.com.cyhttps://www.fda.gov/media/136312/download  Fact Sheet for Healthcare Providers: https://pope.com/https://www.fda.gov/media/136313/download  This test is not yet approved or  cleared by the [place] FDA and has been authorized for detection and/or diagnosis of SARS-CoV-2 by FDA under an Emergency Use Authorization (EUA).  This EUA will remain in effect (meaning this test can be used) for the duration of the COVID-19 declaration under Section 564(b)(1) of the Act, 21 U.S.C. section 360bbb-3(b)(1), unless the authorization is terminated or revoked sooner.  Performed at [facility], [address] [place], [place] [address]     Radiology Reports CT Head Wo Contrast  Result Date: 07/14/2020 CLINICAL DATA:  Found unresponsive, altered mental status EXAM: CT HEAD WITHOUT CONTRAST CT CERVICAL SPINE WITHOUT CONTRAST TECHNIQUE: Multidetector CT imaging of the head and cervical spine was performed following the standard  protocol without intravenous contrast. Multiplanar CT image reconstructions of the cervical spine were also generated. COMPARISON:  CT head 07/11/2015 FINDINGS: CT HEAD FINDINGS Brain: No evidence of acute infarction, hemorrhage, hydrocephalus, extra-axial collection or mass lesion/mass effect. Vascular: No hyperdense vessel or unexpected calcification. Skull: No calvarial fracture or suspicious osseous lesion. No scalp swelling or hematoma. Sinuses/Orbits: Minimal mural thickening in the ethmoids and maxillary sinuses. Included orbital structures are unremarkable. Other: None. CT CERVICAL SPINE FINDINGS Alignment: Stabilization collar absent. Reversal the normal cervical lordosis centered at the C3-4 level. No evidence of traumatic listhesis. No abnormally widened, perched or jumped facets. Normal alignment of the craniocervical and atlantoaxial articulations. Skull base and vertebrae: Tiny corticated ossification adjacent the  tip of the dens is likely degenerative in nature. No convincing acute skull base fracture, vertebral body fracture or height loss. Normal bone mineralization. No worrisome osseous lesions. Minimal spondylitic changes detailed below. Soft tissues and spinal canal: No pre or paravertebral fluid or swelling. No visible canal hematoma. Subglottic secretions are noted in the trachea. Disc levels: Minimal diffuse mild intervertebral disc height loss with early spondylitic changes. Aimed maximal disc osteophyte complexes present at C3-4 resulting in at most mild canal stenosis. No other significant canal stenosis or foraminal impingement within the imaged levels of the spine. Upper chest: Some dependent ground-glass opacity noted in the lungs could reflect atelectatic change or developing edema. Other: No concerning thyroid nodules. IMPRESSION: 1. No acute intracranial abnormality. 2. No acute cervical spine fracture or traumatic listhesis. 3. Reversal of the normal cervical lordosis centered at the  C3-4 level, possibly positional, degenerative or related to muscle spasm. 4. Subglottic secretions are noted in the trachea. 5. Some dependent ground-glass opacity in the lungs could reflect atelectatic change or developing edema. Electronically Signed   By: Kreg Shropshire M.D.   On: 07/14/2020 18:20   CT Cervical Spine Wo Contrast  Result Date: 07/14/2020 CLINICAL DATA:  Found unresponsive, altered mental status EXAM: CT HEAD WITHOUT CONTRAST CT CERVICAL SPINE WITHOUT CONTRAST TECHNIQUE: Multidetector CT imaging of the head and cervical spine was performed following the standard protocol without intravenous contrast. Multiplanar CT image reconstructions of the cervical spine were also generated. COMPARISON:  CT head 07/11/2015 FINDINGS: CT HEAD FINDINGS Brain: No evidence of acute infarction, hemorrhage, hydrocephalus, extra-axial collection or mass lesion/mass effect. Vascular: No hyperdense vessel or unexpected calcification. Skull: No calvarial fracture or suspicious osseous lesion. No scalp swelling or hematoma. Sinuses/Orbits: Minimal mural thickening in the ethmoids and maxillary sinuses. Included orbital structures are unremarkable. Other: None. CT CERVICAL SPINE FINDINGS Alignment: Stabilization collar absent. Reversal the normal cervical lordosis centered at the C3-4 level. No evidence of traumatic listhesis. No abnormally widened, perched or jumped facets. Normal alignment of the craniocervical and atlantoaxial articulations. Skull base and vertebrae: Tiny corticated ossification adjacent the tip of the dens is likely degenerative in nature. No convincing acute skull base fracture, vertebral body fracture or height loss. Normal bone mineralization. No worrisome osseous lesions. Minimal spondylitic changes detailed below. Soft tissues and spinal canal: No pre or paravertebral fluid or swelling. No visible canal hematoma. Subglottic secretions are noted in the trachea. Disc levels: Minimal diffuse mild  intervertebral disc height loss with early spondylitic changes. Aimed maximal disc osteophyte complexes present at C3-4 resulting in at most mild canal stenosis. No other significant canal stenosis or foraminal impingement within the imaged levels of the spine. Upper chest: Some dependent ground-glass opacity noted in the lungs could reflect atelectatic change or developing edema. Other: No concerning thyroid nodules. IMPRESSION: 1. No acute intracranial abnormality. 2. No acute cervical spine fracture or traumatic listhesis. 3. Reversal of the normal cervical lordosis centered at the C3-4 level, possibly positional, degenerative or related to muscle spasm. 4. Subglottic secretions are noted in the trachea. 5. Some dependent ground-glass opacity in the lungs could reflect atelectatic change or developing edema. Electronically Signed   By: Kreg Shropshire M.D.   On: 07/14/2020 18:20   DG Chest Portable 1 View  Result Date: 07/14/2020 CLINICAL DATA:  Altered mental status. EXAM: PORTABLE CHEST 1 VIEW COMPARISON:  July 17, 2016 FINDINGS: There is no evidence of acute infiltrate, pleural effusion or pneumothorax. There is mild to moderate severity enlargement  of the cardiac silhouette. The visualized skeletal structures are unremarkable. IMPRESSION: No active disease. Electronically Signed   By: Aram Candela M.D.   On: 07/14/2020 19:30   ECHOCARDIOGRAM COMPLETE  Result Date: 07/15/2020    ECHOCARDIOGRAM REPORT   Patient Name:   KNOX HOLDMAN Date of Exam: 07/15/2020 Medical Rec #:  [id number]     Height:       65.0 in Accession #:    [id number]    Weight:       170.0 lb Date of Birth:  Sep 21, 1976      BSA:          1.846 m Patient Age:    43 years      BP:           129/81 mmHg Patient Gender: F             HR:           79 bpm. Exam Location:  Inpatient Procedure: 2D Echo Indications:    atrial fibrillation 427.31  History:        Patient has no prior history of Echocardiogram examinations.                 Risk  Factors:Hypertension and Current Smoker.  Sonographer:    [name] RDCS (AE) Referring Phys: [id number] Reyaan Thoma S Dio Giller IMPRESSIONS  1. Left ventricular ejection fraction, by estimation, is 65 to 70%. The left ventricle has normal function. The left ventricle has no regional wall motion abnormalities. There is severe left ventricular hypertrophy. Left ventricular diastolic parameters  are consistent with Grade I diastolic dysfunction (impaired relaxation).  2. Right ventricular systolic function is moderately reduced. The right ventricular size is moderately enlarged. There is mildly elevated pulmonary artery systolic pressure.  3. Right atrial size was mild to moderately dilated.  4. The mitral valve is normal in structure. No evidence of mitral valve regurgitation.  5. The aortic valve is normal in structure. Aortic valve regurgitation is not visualized.  6. The inferior vena cava is dilated in size with <50% respiratory variability, suggesting right atrial pressure of 15 mmHg. FINDINGS  Left Ventricle: Left ventricular ejection fraction, by estimation, is 65 to 70%. The left ventricle has normal function. The left ventricle has no regional wall motion abnormalities. The left ventricular internal cavity size was normal in size. There is  severe left ventricular hypertrophy. Left ventricular diastolic parameters are consistent with Grade I diastolic dysfunction (impaired relaxation). Right Ventricle: The right ventricular size is moderately enlarged. No increase in right ventricular wall thickness. Right ventricular systolic function is moderately reduced. There is mildly elevated pulmonary artery systolic pressure. The tricuspid regurgitant velocity is 2.95 m/s, and with an assumed right atrial pressure of 3 mmHg, the estimated right ventricular systolic pressure is 37.8 mmHg. Left Atrium: Left atrial size was normal in size. Right Atrium: Right atrial size was mild to moderately dilated. Pericardium: There is  no evidence of pericardial effusion. Mitral Valve: The mitral valve is normal in structure. No evidence of mitral valve regurgitation. Tricuspid Valve: The tricuspid valve is normal in structure. Tricuspid valve regurgitation is trivial. Aortic Valve: The aortic valve is normal in structure. Aortic valve regurgitation is not visualized. Pulmonic Valve: The pulmonic valve was normal in structure. Pulmonic valve regurgitation is not visualized. Aorta: The aortic root is normal in size and structure. Venous: The inferior vena cava is dilated in size with less than 50% respiratory variability, suggesting right atrial pressure of 15 mmHg.  IAS/Shunts: No atrial level shunt detected by color flow Doppler.  LEFT VENTRICLE PLAX 2D LVIDd:         3.20 cm  Diastology LVIDs:         2.35 cm  LV e' lateral:   7.62 cm/s LV PW:         1.40 cm  LV E/e' lateral: 7.4 LV IVS:        1.80 cm  LV e' medial:    6.53 cm/s LVOT diam:     2.10 cm  LV E/e' medial:  8.7 LV SV:         68 LV SV Index:   37 LVOT Area:     3.46 cm  RIGHT VENTRICLE RV S prime:     12.00 cm/s TAPSE (M-mode): 1.6 cm LEFT ATRIUM             Index       RIGHT ATRIUM           Index LA diam:        2.90 cm 1.57 cm/m  RA Area:     17.20 cm LA Vol (A2C):   26.9 ml 14.57 ml/m RA Volume:   47.40 ml  25.68 ml/m LA Vol (A4C):   37.7 ml 20.42 ml/m LA Biplane Vol: 33.1 ml 17.93 ml/m  AORTIC VALVE LVOT Vmax:   103.00 cm/s LVOT Vmean:  71.100 cm/s LVOT VTI:    0.197 m  AORTA Ao Root diam: 3.10 cm MITRAL VALVE               TRICUSPID VALVE MV Area (PHT): 3.12 cm    TR Peak grad:   34.8 mmHg MV Decel Time: 243 msec    TR Vmax:        295.00 cm/s MV E velocity: 56.60 cm/s MV A velocity: 72.80 cm/s  SHUNTS MV E/A ratio:  0.78        Systemic VTI:  0.20 m                            Systemic Diam: 2.10 cm Dietrich Pates MD Electronically signed by Dietrich Pates MD Signature Date/Time: 07/15/2020/1:38:33 PM    Final    CT Renal Stone Study  Result Date: 07/14/2020 CLINICAL DATA:   Altered mental status with lower abdominal pain EXAM: CT ABDOMEN AND PELVIS WITHOUT CONTRAST TECHNIQUE: Multidetector CT imaging of the abdomen and pelvis was performed following the standard protocol without IV contrast. COMPARISON:  2019 FINDINGS: Lower chest: Partially imaged bilateral lower lobe patchy density. Hepatobiliary: No focal liver abnormality is seen. No gallstones, gallbladder wall thickening, or biliary dilatation. Pancreas: Unremarkable. Spleen: Unremarkable. Adrenals/Urinary Tract: Adrenals are unremarkable. Decreased size of left lower pole renal cyst with peripheral hyperdensity, which may reflect mineralization. No renal calculi or hydronephrosis. Bladder is unremarkable. Stomach/Bowel: Stomach is within normal limits. Bowel is normal in caliber. Normal appendix. Vascular/Lymphatic: No significant vascular findings on this noncontrast study. No enlarged lymph nodes identified. Reproductive: Uterus and bilateral adnexa are unremarkable. Other: No ascites.  No acute abdominal wall abnormality. Musculoskeletal: No acute osseous abnormality. IMPRESSION: Partially imaged bilateral lower lobe patchy density reflecting atelectasis or pneumonia in the appropriate setting. No acute abnormality of the abdomen and pelvis. Electronically Signed   By: Guadlupe Spanish M.D.   On: 07/14/2020 20:33     Huey Bienenstock M.D on 07/15/2020 at 2:20 PM    [facility] -  Office  [phone]     

## 2020-07-15 NOTE — Plan of Care (Signed)
New admit

## 2020-07-15 NOTE — ED Notes (Signed)
RN has artempted to call report, floor RN is not on the floor at this time, this RN was told to call back

## 2020-07-16 DIAGNOSIS — J690 Pneumonitis due to inhalation of food and vomit: Secondary | ICD-10-CM | POA: Diagnosis not present

## 2020-07-16 LAB — COMPREHENSIVE METABOLIC PANEL
ALT: 98 U/L — ABNORMAL HIGH (ref 0–44)
AST: 179 U/L — ABNORMAL HIGH (ref 15–41)
Albumin: 2.7 g/dL — ABNORMAL LOW (ref 3.5–5.0)
Alkaline Phosphatase: 47 U/L (ref 38–126)
Anion gap: 6 (ref 5–15)
BUN: 8 mg/dL (ref 6–20)
CO2: 29 mmol/L (ref 22–32)
Calcium: 8.6 mg/dL — ABNORMAL LOW (ref 8.9–10.3)
Chloride: 104 mmol/L (ref 98–111)
Creatinine, Ser: 0.82 mg/dL (ref 0.44–1.00)
GFR calc Af Amer: >60 mL/min (ref >60)
GFR calc non Af Amer: >60 mL/min (ref >60)
Glucose, Bld: 99 mg/dL (ref 70–99)
Potassium: 3.2 mmol/L — ABNORMAL LOW (ref 3.5–5.1)
Sodium: 139 mmol/L (ref 135–145)
Total Bilirubin: 0.6 mg/dL (ref 0.3–1.2)
Total Protein: 5.8 g/dL — ABNORMAL LOW (ref 6.5–8.1)

## 2020-07-16 LAB — CBC
HCT: 37.9 % (ref 36.0–46.0)
Hemoglobin: 11.8 g/dL — ABNORMAL LOW (ref 12.0–15.0)
MCH: 31.5 pg (ref 26.0–34.0)
MCHC: 31.1 g/dL (ref 30.0–36.0)
MCV: 101.1 fL — ABNORMAL HIGH (ref 80.0–100.0)
Platelets: 186 10*3/uL (ref 150–400)
RBC: 3.75 MIL/uL — ABNORMAL LOW (ref 3.87–5.11)
RDW: 13.5 % (ref 11.5–15.5)
WBC: 9 10*3/uL (ref 4.0–10.5)
nRBC: 0 % (ref 0.0–0.2)

## 2020-07-16 LAB — LACTIC ACID, PLASMA: Lactic Acid, Venous: 2 mmol/L (ref 0.5–1.9)

## 2020-07-16 MED ORDER — ONDANSETRON HCL 4 MG PO TABS: 4.0000 mg | ORAL_TABLET | Freq: Every day | ORAL | 0 refills | Status: AC | PRN

## 2020-07-16 MED ORDER — POTASSIUM CHLORIDE ER 10 MEQ PO TBCR: 10.0000 meq | EXTENDED_RELEASE_TABLET | Freq: Every day | ORAL | 0 refills | Status: DC

## 2020-07-16 MED ORDER — POTASSIUM CHLORIDE CRYS ER 20 MEQ PO TBCR
40.0000 meq | EXTENDED_RELEASE_TABLET | Freq: Once | ORAL | Status: AC
  Administered 2020-07-16: 40 meq via ORAL
  Filled 2020-07-16: qty 2

## 2020-07-16 MED ORDER — AMOXICILLIN-POT CLAVULANATE 875-125 MG PO TABS
1.0000 | ORAL_TABLET | Freq: Two times a day (BID) | ORAL | 0 refills | Status: AC
Start: 2020-07-16 — End: 2020-07-21

## 2020-07-16 MED FILL — POTASSIUM CHL ER M10 TABLET: 10 | 5 days supply | Qty: 5 | Fill #0

## 2020-07-16 MED FILL — AMOX-CLAV 875-125 MG TABLET: 875-125 | 5 days supply | Qty: 10 | Fill #0

## 2020-07-16 MED FILL — ONDANSETRON HCL 4 MG TABLET: 4 | 30 days supply | Qty: 30 | Fill #0

## 2020-07-16 NOTE — Progress Notes (Signed)
    Patient Saturations on Room Air at Rest = 94%  Patient Saturations on Room Air while Ambulating = 96%  Patient ambulated 50 ft down the hall without any complaints of SOB or discomfort.

## 2020-07-16 NOTE — Progress Notes (Signed)
CSW received consult regarding substance use resources. CSW spoke with patient. She stated that she stays with roommates and will call for a ride home. She has a PCP that she will follow up with. 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 No other needs reported at this time.   Arlee Bossard LCSW

## 2020-07-16 NOTE — Discharge Summary (Signed)
Physician Discharge Summary  Kaylee Simpson ZOX:[id number] DOB: June 13, 1976 DOA: 07/14/2020  PCP: Fleet Contras, MD  Admit date: 07/14/2020 Discharge date: 07/16/2020  Admitted From: Home  Disposition:  Home   Recommendations for Outpatient Follow-up:  1. Follow up with PCP in 1 week by phone, appointment arranged 2. Dr. Concepcion Elk: Please obtain BMP and LFTs in 1 week to follow potassium, AST/ALT   Home Health: None  Equipment/Devices: None  Discharge Condition: Good  CODE STATUS: FULL Diet recommendation: Regular  Brief/Interim Summary: Kaylee Simpson is a 44 y.o. F with no significant PMHx who presented after being found down.  Per report, she was drinking alcohol doing cocaine and marijuana at a party/cookout prior to being found unresponsive.  Emergency services provided bag valve respirations and Narcan and she had some improvement in mental status.  By time of arrival to the ER she was hypothermic and tachycardic with BP 90s over 60s, but was improving gradually.  Lactate 5.9, creatinine 1.9, CT of the head and C-spine unremarkable, CT of the abdomen negative, except for aspiration pneumonia.  She was started on broad-spectrum antibiotics and admitted.       PRINCIPAL HOSPITAL DIAGNOSIS: Aspiration pneumonia, likely secondary to drug overdose    Discharge Diagnoses:   Aspiration pneumonia, POA, likely secondary to drug overdose Patient was admitted, started on IV fluids and antibiotics.  Her creatinine normalized, her lactate improved, her mentation returned to baseline, she was afebrile, with normal heart rate and respiratory rate, and oxygen normal at rest and with ambulation.    She was discharged to complete 7 days of Augmentin.  Prescriptions provided to patient at time of discharge from hospital pharmacy.  With regard to drug overdose, the patient was provided resources by case management, and endorsed plans for sobriety.  Strict abstinence from cocaine, alcohol was  recommended.    Hypotension, elevated troponin, elevated LFTs due to drug overdose BP normalized with fluids and narcan.  Echocardiogram obtained due to mildly elevated but flat troponin elevation, showed normal EF, no regional wall motion abnormalities or effusion.  ACS doubted.  Follow up LFTs with PCP in 1 week.  Appointment made.  Acute kidney injury and acute metabolic encephalopathy, POA due to drug overdose Resolved with fluids.     Discharge Instructions  Discharge Instructions    Discharge instructions   Complete by: As directed    From Drs. Nara Paternoster and Elgergawy: You were admitted for being found unresponsive. We aren't able to tell for sure if your heart stopped, but we suspect that it did. This was likely because of cocaine which has the tendency to do that  Here, we found you also had a pneumonia  Take an antibiotic Augmentin, starting tonight, Monday night Take Augmentin 875-125 mg (1tablet) twice daily for the next five days Take one tablet in the morning and one at night  Go see Dr. Roderic Palau in 1 week Have him check your lab work Take the potassium supplement each night before bed for the next 5 nights  Use the green flutter valve and the clear incentive spirometer, every hour for the nextweek to clear out the lungs  For the cough, use a Robitussin (or equivalent) that contains "guiafenesin and dextromethorphan"   Increase activity slowly   Complete by: As directed      Allergies as of 07/16/2020   No Known Allergies     Medication List    STOP taking these medications   acetaminophen 500 MG tablet Commonly known as: TYLENOL  diclofenac sodium 1 % Gel Commonly known as: VOLTAREN   Free Spirit Knee/Leg Walker Misc   HYDROcodone-acetaminophen 5-325 MG tablet Commonly known as: Norco   ibuprofen 600 MG tablet Commonly known as: ADVIL   meloxicam 15 MG tablet Commonly known as: MOBIC     TAKE these medications   amoxicillin-clavulanate  875-125 MG tablet Commonly known as: Augmentin Take 1 tablet by mouth 2 (two) times daily for 10 doses.   ondansetron 4 MG tablet Commonly known as: Zofran Take 1 tablet (4 mg total) by mouth daily as needed for nausea or vomiting.   potassium chloride 10 MEQ tablet Commonly known as: KLOR-CON Take 1 tablet (10 mEq total) by mouth daily.       Follow-up Information    Fleet Contras, MD. Go in 1 week(s).   Specialty: Internal Medicine Why: You have an appt on Jul 28th by phone Contact information: [address] [place] [address] [phone]              No Known Allergies  Consultations:     Procedures/Studies: CT Head Wo Contrast  Result Date: 07/14/2020 CLINICAL DATA:  Found unresponsive, altered mental status EXAM: CT HEAD WITHOUT CONTRAST CT CERVICAL SPINE WITHOUT CONTRAST TECHNIQUE: Multidetector CT imaging of the head and cervical spine was performed following the standard protocol without intravenous contrast. Multiplanar CT image reconstructions of the cervical spine were also generated. COMPARISON:  CT head 07/11/2015 FINDINGS: CT HEAD FINDINGS Brain: No evidence of acute infarction, hemorrhage, hydrocephalus, extra-axial collection or mass lesion/mass effect. Vascular: No hyperdense vessel or unexpected calcification. Skull: No calvarial fracture or suspicious osseous lesion. No scalp swelling or hematoma. Sinuses/Orbits: Minimal mural thickening in the ethmoids and maxillary sinuses. Included orbital structures are unremarkable. Other: None. CT CERVICAL SPINE FINDINGS Alignment: Stabilization collar absent. Reversal the normal cervical lordosis centered at the C3-4 level. No evidence of traumatic listhesis. No abnormally widened, perched or jumped facets. Normal alignment of the craniocervical and atlantoaxial articulations. Skull base and vertebrae: Tiny corticated ossification adjacent the tip of the dens is likely degenerative in nature. No convincing  acute skull base fracture, vertebral body fracture or height loss. Normal bone mineralization. No worrisome osseous lesions. Minimal spondylitic changes detailed below. Soft tissues and spinal canal: No pre or paravertebral fluid or swelling. No visible canal hematoma. Subglottic secretions are noted in the trachea. Disc levels: Minimal diffuse mild intervertebral disc height loss with early spondylitic changes. Aimed maximal disc osteophyte complexes present at C3-4 resulting in at most mild canal stenosis. No other significant canal stenosis or foraminal impingement within the imaged levels of the spine. Upper chest: Some dependent ground-glass opacity noted in the lungs could reflect atelectatic change or developing edema. Other: No concerning thyroid nodules. IMPRESSION: 1. No acute intracranial abnormality. 2. No acute cervical spine fracture or traumatic listhesis. 3. Reversal of the normal cervical lordosis centered at the C3-4 level, possibly positional, degenerative or related to muscle spasm. 4. Subglottic secretions are noted in the trachea. 5. Some dependent ground-glass opacity in the lungs could reflect atelectatic change or developing edema. Electronically Signed   By: Kreg Shropshire M.D.   On: 07/14/2020 18:20   CT ANGIO CHEST PE W OR WO CONTRAST  Result Date: 07/15/2020 CLINICAL DATA:  Chest pain. EXAM: CT ANGIOGRAPHY CHEST WITH CONTRAST TECHNIQUE: Multidetector CT imaging of the chest was performed using the standard protocol during bolus administration of intravenous contrast. Multiplanar CT image reconstructions and MIPs were obtained to evaluate the vascular anatomy.  CONTRAST:  63mL OMNIPAQUE IOHEXOL 350 MG/ML SOLN COMPARISON:  None. FINDINGS: Cardiovascular: Satisfactory opacification of the pulmonary arteries to the segmental level. No evidence of pulmonary embolism. Mild cardiomegaly is noted. No pericardial effusion. Mediastinum/Nodes: No enlarged mediastinal, hilar, or axillary lymph  nodes. Thyroid gland, trachea, and esophagus demonstrate no significant findings. Lungs/Pleura: No pneumothorax or pleural effusion is noted. Lower lobe patchy airspace opacities are noted most consistent with pneumonia. Multiple smaller airspace opacities are noted in both upper lobes concerning for multifocal pneumonia. Upper Abdomen: No acute abnormality. Musculoskeletal: No chest wall abnormality. No acute or significant osseous findings. Review of the MIP images confirms the above findings. IMPRESSION: 1. No definite evidence of pulmonary embolus. 2. Mild cardiomegaly. 3. Lower lobe patchy airspace opacities are noted most consistent with pneumonia. Multiple smaller airspace opacities are noted in both upper lobes concerning for multifocal pneumonia. Electronically Signed   By: Lupita RaiderJames  Green Jr M.D.   On: 07/15/2020 15:27   CT Cervical Spine Wo Contrast  Result Date: 07/14/2020 CLINICAL DATA:  Found unresponsive, altered mental status EXAM: CT HEAD WITHOUT CONTRAST CT CERVICAL SPINE WITHOUT CONTRAST TECHNIQUE: Multidetector CT imaging of the head and cervical spine was performed following the standard protocol without intravenous contrast. Multiplanar CT image reconstructions of the cervical spine were also generated. COMPARISON:  CT head 07/11/2015 FINDINGS: CT HEAD FINDINGS Brain: No evidence of acute infarction, hemorrhage, hydrocephalus, extra-axial collection or mass lesion/mass effect. Vascular: No hyperdense vessel or unexpected calcification. Skull: No calvarial fracture or suspicious osseous lesion. No scalp swelling or hematoma. Sinuses/Orbits: Minimal mural thickening in the ethmoids and maxillary sinuses. Included orbital structures are unremarkable. Other: None. CT CERVICAL SPINE FINDINGS Alignment: Stabilization collar absent. Reversal the normal cervical lordosis centered at the C3-4 level. No evidence of traumatic listhesis. No abnormally widened, perched or jumped facets. Normal alignment of  the craniocervical and atlantoaxial articulations. Skull base and vertebrae: Tiny corticated ossification adjacent the tip of the dens is likely degenerative in nature. No convincing acute skull base fracture, vertebral body fracture or height loss. Normal bone mineralization. No worrisome osseous lesions. Minimal spondylitic changes detailed below. Soft tissues and spinal canal: No pre or paravertebral fluid or swelling. No visible canal hematoma. Subglottic secretions are noted in the trachea. Disc levels: Minimal diffuse mild intervertebral disc height loss with early spondylitic changes. Aimed maximal disc osteophyte complexes present at C3-4 resulting in at most mild canal stenosis. No other significant canal stenosis or foraminal impingement within the imaged levels of the spine. Upper chest: Some dependent ground-glass opacity noted in the lungs could reflect atelectatic change or developing edema. Other: No concerning thyroid nodules. IMPRESSION: 1. No acute intracranial abnormality. 2. No acute cervical spine fracture or traumatic listhesis. 3. Reversal of the normal cervical lordosis centered at the C3-4 level, possibly positional, degenerative or related to muscle spasm. 4. Subglottic secretions are noted in the trachea. 5. Some dependent ground-glass opacity in the lungs could reflect atelectatic change or developing edema. Electronically Signed   By: Kreg ShropshirePrice  DeHay M.D.   On: 07/14/2020 18:20   DG Chest Portable 1 View  Result Date: 07/14/2020 CLINICAL DATA:  Altered mental status. EXAM: PORTABLE CHEST 1 VIEW COMPARISON:  July 17, 2016 FINDINGS: There is no evidence of acute infiltrate, pleural effusion or pneumothorax. There is mild to moderate severity enlargement of the cardiac silhouette. The visualized skeletal structures are unremarkable. IMPRESSION: No active disease. Electronically Signed   By: Aram Candelahaddeus  Houston M.D.   On: 07/14/2020 19:30  ECHOCARDIOGRAM COMPLETE  Result Date:  07/15/2020    ECHOCARDIOGRAM REPORT   Patient Name:   Kaylee Simpson Date of Exam: 07/15/2020 Medical Rec #:  [id number]     Height:       65.0 in Accession #:    [id number]    Weight:       170.0 lb Date of Birth:  11/05/1976      BSA:          1.846 m Patient Age:    43 years      BP:           129/81 mmHg Patient Gender: F             HR:           79 bpm. Exam Location:  Inpatient Procedure: 2D Echo Indications:    atrial fibrillation 427.31  History:        Patient has no prior history of Echocardiogram examinations.                 Risk Factors:Hypertension and Current Smoker.  Sonographer:    [name] RDCS (AE) Referring Phys: [id number] [address] ELGERGAWY IMPRESSIONS  1. Left ventricular ejection fraction, by estimation, is 65 to 70%. The left ventricle has normal function. The left ventricle has no regional wall motion abnormalities. There is severe left ventricular hypertrophy. Left ventricular diastolic parameters  are consistent with Grade I diastolic dysfunction (impaired relaxation).  2. Right ventricular systolic function is moderately reduced. The right ventricular size is moderately enlarged. There is mildly elevated pulmonary artery systolic pressure.  3. Right atrial size was mild to moderately dilated.  4. The mitral valve is normal in structure. No evidence of mitral valve regurgitation.  5. The aortic valve is normal in structure. Aortic valve regurgitation is not visualized.  6. The inferior vena cava is dilated in size with <50% respiratory variability, suggesting right atrial pressure of 15 mmHg. FINDINGS  Left Ventricle: Left ventricular ejection fraction, by estimation, is 65 to 70%. The left ventricle has normal function. The left ventricle has no regional wall motion abnormalities. The left ventricular internal cavity size was normal in size. There is  severe left ventricular hypertrophy. Left ventricular diastolic parameters are consistent with Grade I diastolic dysfunction (impaired  relaxation). Right Ventricle: The right ventricular size is moderately enlarged. No increase in right ventricular wall thickness. Right ventricular systolic function is moderately reduced. There is mildly elevated pulmonary artery systolic pressure. The tricuspid regurgitant velocity is 2.95 m/s, and with an assumed right atrial pressure of 3 mmHg, the estimated right ventricular systolic pressure is 37.8 mmHg. Left Atrium: Left atrial size was normal in size. Right Atrium: Right atrial size was mild to moderately dilated. Pericardium: There is no evidence of pericardial effusion. Mitral Valve: The mitral valve is normal in structure. No evidence of mitral valve regurgitation. Tricuspid Valve: The tricuspid valve is normal in structure. Tricuspid valve regurgitation is trivial. Aortic Valve: The aortic valve is normal in structure. Aortic valve regurgitation is not visualized. Pulmonic Valve: The pulmonic valve was normal in structure. Pulmonic valve regurgitation is not visualized. Aorta: The aortic root is normal in size and structure. Venous: The inferior vena cava is dilated in size with less than 50% respiratory variability, suggesting right atrial pressure of 15 mmHg. IAS/Shunts: No atrial level shunt detected by color flow Doppler.  LEFT VENTRICLE PLAX 2D LVIDd:         3.20 cm  Diastology LVIDs:  2.35 cm  LV e' lateral:   7.62 cm/s LV PW:         1.40 cm  LV E/e' lateral: 7.4 LV IVS:        1.80 cm  LV e' medial:    6.53 cm/s LVOT diam:     2.10 cm  LV E/e' medial:  8.7 LV SV:         68 LV SV Index:   37 LVOT Area:     3.46 cm  RIGHT VENTRICLE RV S prime:     12.00 cm/s TAPSE (M-mode): 1.6 cm LEFT ATRIUM             Index       RIGHT ATRIUM           Index LA diam:        2.90 cm 1.57 cm/m  RA Area:     17.20 cm LA Vol (A2C):   26.9 ml 14.57 ml/m RA Volume:   47.40 ml  25.68 ml/m LA Vol (A4C):   37.7 ml 20.42 ml/m LA Biplane Vol: 33.1 ml 17.93 ml/m  AORTIC VALVE LVOT Vmax:   103.00 cm/s LVOT  Vmean:  71.100 cm/s LVOT VTI:    0.197 m  AORTA Ao Root diam: 3.10 cm MITRAL VALVE               TRICUSPID VALVE MV Area (PHT): 3.12 cm    TR Peak grad:   34.8 mmHg MV Decel Time: 243 msec    TR Vmax:        295.00 cm/s MV E velocity: 56.60 cm/s MV A velocity: 72.80 cm/s  SHUNTS MV E/A ratio:  0.78        Systemic VTI:  0.20 m                            Systemic Diam: 2.10 cm Dietrich Pates MD Electronically signed by Dietrich Pates MD Signature Date/Time: 07/15/2020/1:38:33 PM    Final    CT Renal Stone Study  Result Date: 07/14/2020 CLINICAL DATA:  Altered mental status with lower abdominal pain EXAM: CT ABDOMEN AND PELVIS WITHOUT CONTRAST TECHNIQUE: Multidetector CT imaging of the abdomen and pelvis was performed following the standard protocol without IV contrast. COMPARISON:  2019 FINDINGS: Lower chest: Partially imaged bilateral lower lobe patchy density. Hepatobiliary: No focal liver abnormality is seen. No gallstones, gallbladder wall thickening, or biliary dilatation. Pancreas: Unremarkable. Spleen: Unremarkable. Adrenals/Urinary Tract: Adrenals are unremarkable. Decreased size of left lower pole renal cyst with peripheral hyperdensity, which may reflect mineralization. No renal calculi or hydronephrosis. Bladder is unremarkable. Stomach/Bowel: Stomach is within normal limits. Bowel is normal in caliber. Normal appendix. Vascular/Lymphatic: No significant vascular findings on this noncontrast study. No enlarged lymph nodes identified. Reproductive: Uterus and bilateral adnexa are unremarkable. Other: No ascites.  No acute abdominal wall abnormality. Musculoskeletal: No acute osseous abnormality. IMPRESSION: Partially imaged bilateral lower lobe patchy density reflecting atelectasis or pneumonia in the appropriate setting. No acute abnormality of the abdomen and pelvis. Electronically Signed   By: Guadlupe Spanish M.D.   On: 07/14/2020 20:33       Subjective: The patient has some mild nausea, but no  dyspnea, chest discomfort.  She has persistent cough, no leg swelling, orthopnea.  No new fever.  No confusion.  Discharge Exam: Vitals:   07/16/20 0747 07/16/20 1215  BP: (!) 142/89 (!) 167/97  Pulse: 85 72  Resp: 15  16  Temp: 98.4 F (36.9 C) 98.4 F (36.9 C)  SpO2: 90% 95%   Vitals:   07/16/20 0003 07/16/20 0408 07/16/20 0747 07/16/20 1215  BP: 127/88  (!) 142/89 (!) 167/97  Pulse:   85 72  Resp:   15 16  Temp: 99.2 F (37.3 C) 98.3 F (36.8 C) 98.4 F (36.9 C) 98.4 F (36.9 C)  TempSrc: Oral Oral Oral Oral  SpO2:  91% 90% 95%  Weight:      Height:        General: Pt is alert, awake, not in acute distress, lying in bed, interactive Cardiovascular: RRR, nl S1-S2, no murmurs appreciated.   No LE edema.   Respiratory: Normal respiratory rate and rhythm.  Crackles in the right base more than left, no wheezing, good air movement. Abdominal: Abdomen soft and non-tender.  No distension or HSM.   Neuro/Psych: Strength symmetric in upper and lower extremities.  Judgment and insight appear normal.   The results of significant diagnostics from this hospitalization (including imaging, microbiology, ancillary and laboratory) are listed below for reference.     Microbiology: Recent Results (from the past 240 hour(s))  Blood culture (routine x 2)     Status: None (Preliminary result)   Collection Time: 07/14/20  6:19 PM   Specimen: BLOOD  Result Value Ref Range Status   Specimen Description BLOOD RIGHT ANTECUBITAL  Final   Special Requests   Final    BOTTLES DRAWN AEROBIC AND ANAEROBIC Blood Culture results may not be optimal due to an inadequate volume of blood received in culture bottles   Culture   Final    NO GROWTH 2 DAYS Performed at [facility], [address], [place], [place] [address]    Report Status PENDING  Incomplete  Blood culture (routine x 2)     Status: None (Preliminary result)   Collection Time: 07/14/20  6:27 PM   Specimen: BLOOD  Result Value  Ref Range Status   Specimen Description BLOOD LEFT ANTECUBITAL  Final   Special Requests   Final    BOTTLES DRAWN AEROBIC AND ANAEROBIC Blood Culture adequate volume   Culture   Final    NO GROWTH 2 DAYS Performed at [facility], [address], [place], [place] [address]    Report Status PENDING  Incomplete  SARS Coronavirus 2 by RT PCR (hospital order, performed in [facility] hospital lab) Nasopharyngeal Nasopharyngeal Swab     Status: None   Collection Time: 07/14/20  9:29 PM   Specimen: Nasopharyngeal Swab  Result Value Ref Range Status   SARS Coronavirus 2 NEGATIVE NEGATIVE Final    Comment: (NOTE) SARS-CoV-2 target nucleic acids are NOT DETECTED.  The SARS-CoV-2 RNA is generally detectable in upper and lower respiratory specimens during the acute phase of infection. The lowest concentration of SARS-CoV-2 viral copies this assay can detect is 250 copies / mL. A negative result does not preclude SARS-CoV-2 infection and should not be used as the sole basis for treatment or other patient management decisions.  A negative result may occur with improper specimen collection / handling, submission of specimen other than nasopharyngeal swab, presence of viral mutation(s) within the areas targeted by this assay, and inadequate number of viral copies (<250 copies / mL). A negative result must be combined with clinical observations, patient history, and epidemiological information.  Fact Sheet for Patients:   BoilerBrush.com.cy  Fact Sheet for Healthcare Providers: https://pope.com/  This test is not yet approved  or  cleared by the [place] and has been authorized for detection and/or diagnosis of SARS-CoV-2 by FDA under an Emergency Use Authorization (EUA).  This EUA will remain in effect (meaning this test can be used) for the duration of the COVID-19 declaration under Section 564(b)(1) of the Act, 21 U.S.C. section  360bbb-3(b)(1), unless the authorization is terminated or revoked sooner.  Performed at [facility], [address] [place], [place] [address]      Labs: BNP (last 3 results) No results for input(s): BNP in the last 8760 hours. Basic Metabolic Panel: Recent Labs  Lab 07/14/20 1742 07/15/20 0111 07/16/20 0451  NA 143 141 139  K 4.1 4.2 3.2*  CL 108 110 104  CO2 18* 22 29  GLUCOSE 181* 77 99  BUN 20 20 8   CREATININE 1.95* 1.03* 0.82  CALCIUM 9.1 8.5* 8.6*   Liver Function Tests: Recent Labs  Lab 07/14/20 1742 07/16/20 0451  AST 68* 179*  ALT 40 98*  ALKPHOS 71 47  BILITOT <0.1* 0.6  PROT 7.5 5.8*  ALBUMIN 3.7 2.7*   No results for input(s): LIPASE, AMYLASE in the last 168 hours. No results for input(s): AMMONIA in the last 168 hours. CBC: Recent Labs  Lab 07/14/20 1742 07/15/20 0111 07/16/20 0451  WBC 10.5 16.5* 9.0  HGB 15.1* 13.2 11.8*  HCT 50.3* 43.4 37.9  MCV 106.1* 102.1* 101.1*  PLT 279 234 186   Cardiac Enzymes: No results for input(s): CKTOTAL, CKMB, CKMBINDEX, TROPONINI in the last 168 hours. BNP: Invalid input(s): POCBNP CBG: Recent Labs  Lab 07/14/20 1729  GLUCAP 209*   D-Dimer No results for input(s): DDIMER in the last 72 hours. Hgb A1c No results for input(s): HGBA1C in the last 72 hours. Lipid Profile No results for input(s): CHOL, HDL, LDLCALC, TRIG, CHOLHDL, LDLDIRECT in the last 72 hours. Thyroid function studies Recent Labs    07/14/20 1822  TSH 4.258   Anemia work up No results for input(s): VITAMINB12, FOLATE, FERRITIN, TIBC, IRON, RETICCTPCT in the last 72 hours. Urinalysis    Component Value Date/Time   COLORURINE YELLOW 07/14/2020 2001   APPEARANCEUR HAZY (A) 07/14/2020 2001   LABSPEC 1.011 07/14/2020 2001   PHURINE 6.0 07/14/2020 2001   GLUCOSEU NEGATIVE 07/14/2020 2001   HGBUR LARGE (A) 07/14/2020 2001   BILIRUBINUR NEGATIVE 07/14/2020 2001   KETONESUR NEGATIVE 07/14/2020 2001   PROTEINUR 100 (A)  07/14/2020 2001   UROBILINOGEN 1.0 07/10/2015 1751   NITRITE NEGATIVE 07/14/2020 2001   LEUKOCYTESUR TRACE (A) 07/14/2020 2001   Sepsis Labs Invalid input(s): PROCALCITONIN,  WBC,  LACTICIDVEN Microbiology Recent Results (from the past 240 hour(s))  Blood culture (routine x 2)     Status: None (Preliminary result)   Collection Time: 07/14/20  6:19 PM   Specimen: BLOOD  Result Value Ref Range Status   Specimen Description BLOOD RIGHT ANTECUBITAL  Final   Special Requests   Final    BOTTLES DRAWN AEROBIC AND ANAEROBIC Blood Culture results may not be optimal due to an inadequate volume of blood received in culture bottles   Culture   Final    NO GROWTH 2 DAYS Performed at [facility], [address], [place], [place] [address]    Report Status PENDING  Incomplete  Blood culture (routine x 2)     Status: None (Preliminary result)   Collection Time: 07/14/20  6:27 PM   Specimen: BLOOD  Result Value Ref Range Status   Specimen  Description BLOOD LEFT ANTECUBITAL  Final   Special Requests   Final    BOTTLES DRAWN AEROBIC AND ANAEROBIC Blood Culture adequate volume   Culture   Final    NO GROWTH 2 DAYS Performed at [facility], [address] [place], [place] [address]    Report Status PENDING  Incomplete  SARS Coronavirus 2 by RT PCR (hospital order, performed in [facility] hospital lab) Nasopharyngeal Nasopharyngeal Swab     Status: None   Collection Time: 07/14/20  9:29 PM   Specimen: Nasopharyngeal Swab  Result Value Ref Range Status   SARS Coronavirus 2 NEGATIVE NEGATIVE Final    Comment: (NOTE) SARS-CoV-2 target nucleic acids are NOT DETECTED.  The SARS-CoV-2 RNA is generally detectable in upper and lower respiratory specimens during the acute phase of infection. The lowest concentration of SARS-CoV-2 viral copies this assay can detect is 250 copies / mL. A negative result does not preclude SARS-CoV-2 infection and should not be used as the sole basis for  treatment or other patient management decisions.  A negative result may occur with improper specimen collection / handling, submission of specimen other than nasopharyngeal swab, presence of viral mutation(s) within the areas targeted by this assay, and inadequate number of viral copies (<250 copies / mL). A negative result must be combined with clinical observations, patient history, and epidemiological information.  Fact Sheet for Patients:   BoilerBrush.com.cy  Fact Sheet for Healthcare Providers: https://pope.com/  This test is not yet approved or  cleared by the [place] FDA and has been authorized for detection and/or diagnosis of SARS-CoV-2 by FDA under an Emergency Use Authorization (EUA).  This EUA will remain in effect (meaning this test can be used) for the duration of the COVID-19 declaration under Section 564(b)(1) of the Act, 21 U.S.C. section 360bbb-3(b)(1), unless the authorization is terminated or revoked sooner.  Performed at [facility], [address] [place], [place] [address]      Time coordinating discharge: 25 minutes The [place] controlled substances registry was reviewed for this patient       SIGNED:   Alberteen Sam, MD  [facility] 07/16/2020, 4:38 PM

## 2020-07-16 NOTE — Progress Notes (Signed)
Kaylee Simpson to be D/C'd Home per MD order.  Discussed with the patient and all questions fully answered.  VSS, Skin clean, dry and intact without evidence of skin break down, no evidence of skin tears noted. IV catheter discontinued intact. Site without signs and symptoms of complications. Dressing and pressure applied.  An After Visit Summary was printed and given to the patient. Patient received prescription.  D/c education completed with patient/family including follow up instructions, medication list, d/c activities limitations if indicated, with other d/c instructions as indicated by MD - patient able to verbalize understanding, all questions fully answered.   Patient instructed to return to ED, call 911, or call MD for any changes in condition.   Pt medication from [facility] was deliver in the room. With flutter valve and IS.      Patient escorted via WC, and D/C home via private auto.   Conley Canal [facility] 07/16/2020 12:54 PM

## 2020-07-19 LAB — CULTURE, BLOOD (ROUTINE X 2)
Culture: NO GROWTH
Culture: NO GROWTH
Special Requests: ADEQUATE

## 2020-07-29 DIAGNOSIS — Z419 Encounter for procedure for purposes other than remedying health state, unspecified: Secondary | ICD-10-CM | POA: Diagnosis not present

## 2020-08-29 DIAGNOSIS — Z419 Encounter for procedure for purposes other than remedying health state, unspecified: Secondary | ICD-10-CM | POA: Diagnosis not present

## 2020-09-11 DIAGNOSIS — I10 Essential (primary) hypertension: Secondary | ICD-10-CM | POA: Diagnosis not present

## 2020-09-11 DIAGNOSIS — Z0000 Encounter for general adult medical examination without abnormal findings: Secondary | ICD-10-CM | POA: Diagnosis not present

## 2020-09-11 DIAGNOSIS — Z131 Encounter for screening for diabetes mellitus: Secondary | ICD-10-CM | POA: Diagnosis not present

## 2020-09-11 DIAGNOSIS — Z1239 Encounter for other screening for malignant neoplasm of breast: Secondary | ICD-10-CM | POA: Diagnosis not present

## 2020-09-11 DIAGNOSIS — Z13220 Encounter for screening for lipoid disorders: Secondary | ICD-10-CM | POA: Diagnosis not present

## 2020-09-28 DIAGNOSIS — Z419 Encounter for procedure for purposes other than remedying health state, unspecified: Secondary | ICD-10-CM | POA: Diagnosis not present

## 2020-10-12 DIAGNOSIS — I10 Essential (primary) hypertension: Secondary | ICD-10-CM | POA: Diagnosis not present

## 2020-10-12 DIAGNOSIS — H00019 Hordeolum externum unspecified eye, unspecified eyelid: Secondary | ICD-10-CM | POA: Diagnosis not present

## 2020-10-19 DIAGNOSIS — H00019 Hordeolum externum unspecified eye, unspecified eyelid: Secondary | ICD-10-CM | POA: Diagnosis not present

## 2020-10-19 DIAGNOSIS — Z13220 Encounter for screening for lipoid disorders: Secondary | ICD-10-CM | POA: Diagnosis not present

## 2020-10-19 DIAGNOSIS — Z131 Encounter for screening for diabetes mellitus: Secondary | ICD-10-CM | POA: Diagnosis not present

## 2020-10-19 DIAGNOSIS — Z0000 Encounter for general adult medical examination without abnormal findings: Secondary | ICD-10-CM | POA: Diagnosis not present

## 2020-10-19 DIAGNOSIS — E559 Vitamin D deficiency, unspecified: Secondary | ICD-10-CM | POA: Diagnosis not present

## 2020-10-19 DIAGNOSIS — I10 Essential (primary) hypertension: Secondary | ICD-10-CM | POA: Diagnosis not present

## 2020-10-29 DIAGNOSIS — Z419 Encounter for procedure for purposes other than remedying health state, unspecified: Secondary | ICD-10-CM | POA: Diagnosis not present

## 2020-11-28 DIAGNOSIS — Z419 Encounter for procedure for purposes other than remedying health state, unspecified: Secondary | ICD-10-CM | POA: Diagnosis not present

## 2020-11-30 DIAGNOSIS — H00019 Hordeolum externum unspecified eye, unspecified eyelid: Secondary | ICD-10-CM | POA: Diagnosis not present

## 2020-11-30 DIAGNOSIS — I10 Essential (primary) hypertension: Secondary | ICD-10-CM | POA: Diagnosis not present

## 2020-12-10 DIAGNOSIS — Z309 Encounter for contraceptive management, unspecified: Secondary | ICD-10-CM | POA: Diagnosis not present

## 2020-12-10 DIAGNOSIS — N951 Menopausal and female climacteric states: Secondary | ICD-10-CM | POA: Diagnosis not present

## 2020-12-10 DIAGNOSIS — I10 Essential (primary) hypertension: Secondary | ICD-10-CM | POA: Diagnosis not present

## 2020-12-29 DIAGNOSIS — Z419 Encounter for procedure for purposes other than remedying health state, unspecified: Secondary | ICD-10-CM | POA: Diagnosis not present

## 2021-01-29 DIAGNOSIS — Z419 Encounter for procedure for purposes other than remedying health state, unspecified: Secondary | ICD-10-CM | POA: Diagnosis not present

## 2021-02-04 DIAGNOSIS — Z124 Encounter for screening for malignant neoplasm of cervix: Secondary | ICD-10-CM | POA: Diagnosis not present

## 2021-02-04 DIAGNOSIS — I10 Essential (primary) hypertension: Secondary | ICD-10-CM | POA: Diagnosis not present

## 2021-02-04 DIAGNOSIS — N925 Other specified irregular menstruation: Secondary | ICD-10-CM | POA: Diagnosis not present

## 2021-02-26 DIAGNOSIS — Z419 Encounter for procedure for purposes other than remedying health state, unspecified: Secondary | ICD-10-CM | POA: Diagnosis not present

## 2021-03-05 ENCOUNTER — Ambulatory Visit (INDEPENDENT_AMBULATORY_CARE_PROVIDER_SITE_OTHER): Payer: Medicaid Other

## 2021-03-05 ENCOUNTER — Ambulatory Visit: Payer: Medicaid Other | Admitting: Orthopedic Surgery

## 2021-03-05 ENCOUNTER — Ambulatory Visit: Payer: Medicaid Other | Admitting: Family

## 2021-03-05 DIAGNOSIS — M25572 Pain in left ankle and joints of left foot: Secondary | ICD-10-CM

## 2021-03-08 ENCOUNTER — Ambulatory Visit: Payer: Medicaid Other | Admitting: Obstetrics

## 2021-03-19 ENCOUNTER — Encounter: Payer: Self-pay | Admitting: Physician Assistant

## 2021-03-19 NOTE — Progress Notes (Signed)
Office Visit Note   Patient: Kaylee Simpson           Date of Birth: [birth year]           MRN: [id number] Visit Date: 03/05/2021              Requested by: Fleet Contras, MD [address] [place],  [place] [address] PCP: Fleet Contras, MD  Chief Complaint  Patient presents with  . Left Ankle - Pain      HPI: Patient is a 45 year old woman who is seen for left ankle pain.  Patient states that she initially underwent open reduction internal fixation by Dr. Ophelia Charter in 2017 and then had a fracture of the proximal aspect of the plate in 3818.  She complains of lateral left ankle pain she is currently wearing an ankle stabilizing orthosis.  Patient states that recently she twisted her ankle and has been having some increased swelling and pain.  Patient is wondering if removing the hardware would help with her symptoms.  Patient states that most of her pain started after the second fracture.  Assessment & Plan: Visit Diagnoses:  1. Pain in left ankle and joints of left foot     Plan: Patient currently has pain over the superficial peroneal nerve where it exits the anterior compartment.  This is most likely due to scar tissue from the second fracture at the proximal aspect of the plate.  The area of the nerve irritation was injected with 1 cc Depo-Medrol and 1 cc of lidocaine plain.  Recommended massage with Voltaren gel  Follow-Up Instructions: Return in about 2 weeks (around 03/19/2021).   Ortho Exam  Patient is alert, oriented, no adenopathy, well-dressed, normal affect, normal respiratory effort. Examination patient has good pulses no arterial insufficiency.  The ankle mortise is not tender to palpation compression and distraction across the syndesmosis is not tender she has point tenderness to palpation over the superficial peroneal nerve where it exits the anterior compartment approximately 10 cm proximal to the tip of the lateral malleolus.  This area was injected with 1 cc  Depo-Medrol and 1 cc of 1% lidocaine plain.   Imaging: No results found. No images are attached to the encounter.  Labs: Lab Results  Component Value Date   ESRSEDRATE 18 07/10/2015   ESRSEDRATE >140 (H) 10/07/2012   REPTSTATUS 07/19/2020 FINAL 07/14/2020   CULT  07/14/2020    NO GROWTH 5 DAYS Performed at [facility], [address], [place], [place] [address]      Lab Results  Component Value Date   ALBUMIN 2.7 (L) 07/16/2020   ALBUMIN 3.7 07/14/2020   ALBUMIN 3.5 09/07/2018    Lab Results  Component Value Date   MG 2.0 10/05/2012   No results found for: VD25OH  No results found for: PREALBUMIN CBC EXTENDED Latest Ref Rng & Units 07/16/2020 07/15/2020 07/14/2020  WBC 4.0 - 10.5 K/uL 9.0 16.5(H) 10.5  RBC 3.87 - 5.11 MIL/uL 3.75(L) 4.25 4.74  HGB 12.0 - 15.0 g/dL 11.8(L) 13.2 15.1(H)  HCT 36.0 - 46.0 % 37.9 43.4 50.3(H)  PLT 150 - 400 K/uL 186 234 279  NEUTROABS 1.7 - 7.7 K/uL - - -  LYMPHSABS 0.7 - 4.0 K/uL - - -     There is no height or weight on file to calculate BMI.  Orders:  Orders Placed This Encounter  Procedures  . XR Ankle Complete Left   No orders of the defined types were placed in this  encounter.    Procedures: No procedures performed  Clinical Data: No additional findings.  ROS:  All other systems negative, except as noted in the HPI. Review of Systems  Objective: Vital Signs: There were no vitals taken for this visit.  Specialty Comments:  No specialty comments available.  PMFS History: Patient Active Problem List   Diagnosis Date Noted  . Acute metabolic encephalopathy 07/15/2020  . Sepsis (HCC) 07/15/2020  . Aspiration pneumonia of both lower lobes due to gastric secretions (HCC) 07/15/2020  . Polysubstance abuse (HCC) 07/15/2020  . Drug overdose 07/15/2020  . Drug overdose, accidental or unintentional, initial encounter 07/14/2020  . Closed nondisplaced oblique fracture of shaft of fibula with routine healing  12/05/2019  . Trochanteric bursitis, left hip 05/24/2018  . Pain in left ankle and joints of left foot 05/24/2018  . Chronic pain of left ankle 12/10/2016  . Status post open reduction with internal fixation (ORIF) of fracture of ankle 12/10/2016  . Status post ORIF of fracture of ankle 04/16/2016  . Anemia 10/08/2012  . Pyelonephritis 10/05/2012  . Hypokalemia 10/05/2012  . Hyponatremia 10/05/2012  . Tachycardia 10/05/2012  . SIRS (systemic inflammatory response syndrome) (HCC) 10/05/2012  . Acute renal insufficiency 10/05/2012   Past Medical History:  Diagnosis Date  . Complication of anesthesia   . Hypertension    not a problem  . PONV (postoperative nausea and vomiting)   . Pyelonephritis "several times"    No family history on file.  Past Surgical History:  Procedure Laterality Date  . DILATION AND CURETTAGE OF UTERUS  1997; 1998  . FRACTURE SURGERY    . ORIF ANKLE FRACTURE Left 04/16/2016   Procedure: OPEN REDUCTION INTERNAL FIXATION (ORIF) LEFT BIMALLEOLAR ANKLE FRACTURE WITH SYNDESMOSIS SCREW;  Surgeon: Eldred Manges, MD;  Location: [facility] OR;  Service: Orthopedics;  Laterality: Left;  . ORIF ANKLE FRACTURE BIMALLEOLAR Left 04/16/2016   ORIF syndesmosis.  Marland Kitchen PERCUTANEOUS PINNING TOE FRACTURE Right 2011   great toe and middle   Social History   Occupational History  . Not on file  Tobacco Use  . Smoking status: Current Every Day Smoker    Packs/day: 0.25    Years: 10.00    Pack years: 2.50    Types: Cigarettes  . Smokeless tobacco: Current User  Substance and Sexual Activity  . Alcohol use: Yes    Alcohol/week: 6.0 standard drinks    Types: 6 Cans of beer per week    Comment: Social  . Drug use: No  . Sexual activity: Yes

## 2021-03-21 ENCOUNTER — Ambulatory Visit (INDEPENDENT_AMBULATORY_CARE_PROVIDER_SITE_OTHER): Payer: Medicaid Other | Admitting: Physician Assistant

## 2021-03-21 ENCOUNTER — Encounter: Payer: Self-pay | Admitting: Orthopedic Surgery

## 2021-03-21 DIAGNOSIS — M25572 Pain in left ankle and joints of left foot: Secondary | ICD-10-CM | POA: Diagnosis not present

## 2021-03-21 NOTE — Progress Notes (Signed)
Office Visit Note   Patient: Kaylee Simpson           Date of Birth: 13-Dec-1976           MRN: [id number] Visit Date: 03/21/2021              Requested by: Fleet Contras, MD [address] [place],  [place] [address] PCP: Fleet Contras, MD  Chief Complaint  Patient presents with  . Left Ankle - Follow-up      HPI: Patient presents today for follow-up on her left ankle pain.  She is status post open reduction internal fixation of a left fibula fracture and subsequently had fracture above the plate.  She saw Dr. Lajoyce Corners a couple weeks ago for ongoing pain in the distribution of the superficial peroneal nerve.  Specifically where it exits the anterior compartment.  He felt this was most likely due to scar tissue from the second fracture at the proximal aspect of the plate.  The area of irritation was injected and she reports she had about 10 minutes of relief and now the pain has returned she said that Dr. Lajoyce Corners did speak with her regarding removal of the hardware and exploration and debridement of the superficial nerve as it exited the anterior compartment.  She feels she has exhausted conservative treatment and would like to go forward with this  Assessment & Plan: Visit Diagnoses: No diagnosis found.  Plan: I did discuss with the patient outcomes expectations of the surgery.  We could do this as a day surgery.  I did discuss it with Dr. Lajoyce Corners and he is fine with going forward we will schedule this at her convenience  Follow-Up Instructions: No follow-ups on file.   Ortho Exam  Patient is alert, oriented, no adenopathy, well-dressed, normal affect, normal respiratory effort. Examination of her left ankle she has good pulses nontender to palpation over the ankle mortise.  She has point tenderness to palpation over the superficial peroneal nerve where it exits the anterior compartment Imaging: No results found. No images are attached to the encounter.  Labs: Lab Results  Component  Value Date   ESRSEDRATE 18 07/10/2015   ESRSEDRATE >140 (H) 10/07/2012   REPTSTATUS 07/19/2020 FINAL 07/14/2020   CULT  07/14/2020    NO GROWTH 5 DAYS Performed at [facility], [address], [place], [place] [address]      Lab Results  Component Value Date   ALBUMIN 2.7 (L) 07/16/2020   ALBUMIN 3.7 07/14/2020   ALBUMIN 3.5 09/07/2018    Lab Results  Component Value Date   MG 2.0 10/05/2012   No results found for: VD25OH  No results found for: PREALBUMIN CBC EXTENDED Latest Ref Rng & Units 07/16/2020 07/15/2020 07/14/2020  WBC 4.0 - 10.5 K/uL 9.0 16.5(H) 10.5  RBC 3.87 - 5.11 MIL/uL 3.75(L) 4.25 4.74  HGB 12.0 - 15.0 g/dL 11.8(L) 13.2 15.1(H)  HCT 36.0 - 46.0 % 37.9 43.4 50.3(H)  PLT 150 - 400 K/uL 186 234 279  NEUTROABS 1.7 - 7.7 K/uL - - -  LYMPHSABS 0.7 - 4.0 K/uL - - -     There is no height or weight on file to calculate BMI.  Orders:  No orders of the defined types were placed in this encounter.  No orders of the defined types were placed in this encounter.    Procedures: No procedures performed  Clinical Data: No additional findings.  ROS:  All other systems negative, except as noted in the  HPI. Review of Systems  Objective: Vital Signs: There were no vitals taken for this visit.  Specialty Comments:  No specialty comments available.  PMFS History: Patient Active Problem List   Diagnosis Date Noted  . Acute metabolic encephalopathy 07/15/2020  . Sepsis (HCC) 07/15/2020  . Aspiration pneumonia of both lower lobes due to gastric secretions (HCC) 07/15/2020  . Polysubstance abuse (HCC) 07/15/2020  . Drug overdose 07/15/2020  . Drug overdose, accidental or unintentional, initial encounter 07/14/2020  . Closed nondisplaced oblique fracture of shaft of fibula with routine healing 12/05/2019  . Trochanteric bursitis, left hip 05/24/2018  . Pain in left ankle and joints of left foot 05/24/2018  . Chronic pain of left ankle 12/10/2016  .  Status post open reduction with internal fixation (ORIF) of fracture of ankle 12/10/2016  . Status post ORIF of fracture of ankle 04/16/2016  . Anemia 10/08/2012  . Pyelonephritis 10/05/2012  . Hypokalemia 10/05/2012  . Hyponatremia 10/05/2012  . Tachycardia 10/05/2012  . SIRS (systemic inflammatory response syndrome) (HCC) 10/05/2012  . Acute renal insufficiency 10/05/2012   Past Medical History:  Diagnosis Date  . Complication of anesthesia   . Hypertension    not a problem  . PONV (postoperative nausea and vomiting)   . Pyelonephritis "several times"    No family history on file.  Past Surgical History:  Procedure Laterality Date  . DILATION AND CURETTAGE OF UTERUS  1997; 1998  . FRACTURE SURGERY    . ORIF ANKLE FRACTURE Left 04/16/2016   Procedure: OPEN REDUCTION INTERNAL FIXATION (ORIF) LEFT BIMALLEOLAR ANKLE FRACTURE WITH SYNDESMOSIS SCREW;  Surgeon: Eldred Manges, MD;  Location: [facility] OR;  Service: Orthopedics;  Laterality: Left;  . ORIF ANKLE FRACTURE BIMALLEOLAR Left 04/16/2016   ORIF syndesmosis.  Marland Kitchen PERCUTANEOUS PINNING TOE FRACTURE Right 2011   great toe and middle   Social History   Occupational History  . Not on file  Tobacco Use  . Smoking status: Current Every Day Smoker    Packs/day: 0.25    Years: 10.00    Pack years: 2.50    Types: Cigarettes  . Smokeless tobacco: Current User  Substance and Sexual Activity  . Alcohol use: Yes    Alcohol/week: 6.0 standard drinks    Types: 6 Cans of beer per week    Comment: Social  . Drug use: No  . Sexual activity: Yes

## 2021-03-25 ENCOUNTER — Telehealth: Payer: Self-pay | Admitting: Orthopedic Surgery

## 2021-03-25 ENCOUNTER — Other Ambulatory Visit: Payer: Self-pay | Admitting: Physician Assistant

## 2021-03-25 MED ORDER — GABAPENTIN 300 MG PO CAPS: 300.0000 mg | ORAL_CAPSULE | Freq: Three times a day (TID) | ORAL | 3 refills | Status: DC

## 2021-03-25 NOTE — Telephone Encounter (Signed)
Called in gabapentin as most of pain was related to nerve scarring

## 2021-03-25 NOTE — Telephone Encounter (Signed)
I called pt to advise of this and she is very thankful. She did ask if you could please send it to the [facility] on [address]. This was in the original note and I did not include that information when I sent it to you.

## 2021-03-25 NOTE — Telephone Encounter (Signed)
Patient called requesting pain medication. Patient states she has been taking over the counter medications and it is not touching her pains. Please send pain meds to [facility] on [address]. Please call patient [phone].

## 2021-03-25 NOTE — Telephone Encounter (Signed)
done

## 2021-03-25 NOTE — Telephone Encounter (Signed)
Please see message below. Pt in office 03/21/21 looks like she is being scheduled for surgery?

## 2021-03-29 DIAGNOSIS — Z419 Encounter for procedure for purposes other than remedying health state, unspecified: Secondary | ICD-10-CM | POA: Diagnosis not present

## 2021-04-04 ENCOUNTER — Other Ambulatory Visit: Payer: Self-pay

## 2021-04-08 ENCOUNTER — Encounter (HOSPITAL_BASED_OUTPATIENT_CLINIC_OR_DEPARTMENT_OTHER): Payer: Self-pay | Admitting: Orthopedic Surgery

## 2021-04-08 NOTE — Progress Notes (Signed)
Pt's past medical history, EKG's and echo reviewed with Dr Tacy Dura. No PAT appt or repeat EKG needed prior to [facility]. Ok to have surgery at [facility].

## 2021-04-09 ENCOUNTER — Other Ambulatory Visit: Payer: Self-pay

## 2021-04-09 ENCOUNTER — Encounter (HOSPITAL_BASED_OUTPATIENT_CLINIC_OR_DEPARTMENT_OTHER): Payer: Self-pay | Admitting: Orthopedic Surgery

## 2021-04-10 ENCOUNTER — Other Ambulatory Visit: Payer: Self-pay | Admitting: Physician Assistant

## 2021-04-12 ENCOUNTER — Other Ambulatory Visit (HOSPITAL_COMMUNITY)
Admission: RE | Admit: 2021-04-12 | Discharge: 2021-04-12 | Disposition: A | Discharge status: Home / Self Care | Payer: Medicaid Other | Source: Ambulatory Visit | Attending: Orthopedic Surgery | Admitting: Orthopedic Surgery

## 2021-04-12 DIAGNOSIS — Z01812 Encounter for preprocedural laboratory examination: Secondary | ICD-10-CM | POA: Insufficient documentation

## 2021-04-12 DIAGNOSIS — Z20822 Contact with and (suspected) exposure to covid-19: Secondary | ICD-10-CM | POA: Diagnosis not present

## 2021-04-12 LAB — SARS CORONAVIRUS 2 (TAT 6-24 HRS): SARS Coronavirus 2: NEGATIVE

## 2021-04-15 ENCOUNTER — Encounter (HOSPITAL_BASED_OUTPATIENT_CLINIC_OR_DEPARTMENT_OTHER)
Admission: RE | Admit: 2021-04-15 | Discharge: 2021-04-15 | Disposition: A | Discharge status: Home / Self Care | Payer: Medicaid Other | Source: Ambulatory Visit | Attending: Orthopedic Surgery | Admitting: Orthopedic Surgery

## 2021-04-15 DIAGNOSIS — Z01812 Encounter for preprocedural laboratory examination: Secondary | ICD-10-CM | POA: Diagnosis not present

## 2021-04-15 DIAGNOSIS — T8484XA Pain due to internal orthopedic prosthetic devices, implants and grafts, initial encounter: Secondary | ICD-10-CM | POA: Diagnosis not present

## 2021-04-15 DIAGNOSIS — G5732 Lesion of lateral popliteal nerve, left lower limb: Secondary | ICD-10-CM | POA: Diagnosis not present

## 2021-04-15 DIAGNOSIS — Y831 Surgical operation with implant of artificial internal device as the cause of abnormal reaction of the patient, or of later complication, without mention of misadventure at the time of the procedure: Secondary | ICD-10-CM | POA: Diagnosis not present

## 2021-04-15 LAB — BASIC METABOLIC PANEL
Anion gap: 9 (ref 5–15)
BUN: <5 mg/dL — ABNORMAL LOW (ref 6–20)
CO2: 25 mmol/L (ref 22–32)
Calcium: 8.9 mg/dL (ref 8.9–10.3)
Chloride: 102 mmol/L (ref 98–111)
Creatinine, Ser: 0.68 mg/dL (ref 0.44–1.00)
GFR, Estimated: >60 mL/min (ref >60)
Glucose, Bld: 105 mg/dL — ABNORMAL HIGH (ref 70–99)
Potassium: 3.5 mmol/L (ref 3.5–5.1)
Sodium: 136 mmol/L (ref 135–145)

## 2021-04-15 LAB — POCT PREGNANCY, URINE: Preg Test, Ur: NEGATIVE

## 2021-04-15 NOTE — Progress Notes (Signed)

## 2021-04-15 NOTE — Progress Notes (Signed)
Sent message reminding pt to come in for lab work today by 3pm.

## 2021-04-16 ENCOUNTER — Ambulatory Visit (HOSPITAL_BASED_OUTPATIENT_CLINIC_OR_DEPARTMENT_OTHER): Payer: Medicaid Other | Admitting: Anesthesiology

## 2021-04-16 ENCOUNTER — Ambulatory Visit (HOSPITAL_BASED_OUTPATIENT_CLINIC_OR_DEPARTMENT_OTHER)
Admission: RE | Admit: 2021-04-16 | Discharge: 2021-04-16 | Disposition: A | Discharge status: Home / Self Care | Payer: Medicaid Other | Attending: Orthopedic Surgery | Admitting: Orthopedic Surgery

## 2021-04-16 ENCOUNTER — Encounter (HOSPITAL_BASED_OUTPATIENT_CLINIC_OR_DEPARTMENT_OTHER): Payer: Self-pay | Admitting: Orthopedic Surgery

## 2021-04-16 ENCOUNTER — Encounter (HOSPITAL_BASED_OUTPATIENT_CLINIC_OR_DEPARTMENT_OTHER)
Admission: RE | Disposition: A | Discharge status: Home / Self Care | Payer: Self-pay | Source: Home / Self Care | Attending: Orthopedic Surgery

## 2021-04-16 ENCOUNTER — Other Ambulatory Visit: Payer: Self-pay

## 2021-04-16 DIAGNOSIS — G8918 Other acute postprocedural pain: Secondary | ICD-10-CM | POA: Diagnosis not present

## 2021-04-16 DIAGNOSIS — T8484XA Pain due to internal orthopedic prosthetic devices, implants and grafts, initial encounter: Secondary | ICD-10-CM | POA: Insufficient documentation

## 2021-04-16 DIAGNOSIS — T85848A Pain due to other internal prosthetic devices, implants and grafts, initial encounter: Secondary | ICD-10-CM

## 2021-04-16 DIAGNOSIS — T79A22A Traumatic compartment syndrome of left lower extremity, initial encounter: Secondary | ICD-10-CM

## 2021-04-16 DIAGNOSIS — E871 Hypo-osmolality and hyponatremia: Secondary | ICD-10-CM | POA: Diagnosis not present

## 2021-04-16 DIAGNOSIS — Y831 Surgical operation with implant of artificial internal device as the cause of abnormal reaction of the patient, or of later complication, without mention of misadventure at the time of the procedure: Secondary | ICD-10-CM | POA: Insufficient documentation

## 2021-04-16 DIAGNOSIS — G5732 Lesion of lateral popliteal nerve, left lower limb: Secondary | ICD-10-CM | POA: Diagnosis not present

## 2021-04-16 HISTORY — PX: HARDWARE REMOVAL: SHX979

## 2021-04-16 HISTORY — DX: Other psychoactive substance abuse, uncomplicated: F19.10

## 2021-04-16 HISTORY — DX: Other chronic pain: G89.29

## 2021-04-16 HISTORY — PX: FASCIOTOMY: SHX132

## 2021-04-16 LAB — POCT PREGNANCY, URINE: Preg Test, Ur: NEGATIVE

## 2021-04-16 SURGERY — REMOVAL, HARDWARE
Anesthesia: General | Site: Leg Lower | Laterality: Left

## 2021-04-16 MED ORDER — FENTANYL CITRATE (PF) 100 MCG/2ML IJ SOLN
INTRAMUSCULAR | Status: AC
  Filled 2021-04-16: qty 2

## 2021-04-16 MED ORDER — FENTANYL CITRATE (PF) 100 MCG/2ML IJ SOLN
INTRAMUSCULAR | Status: DC | PRN
  Administered 2021-04-16: 50 ug via INTRAVENOUS

## 2021-04-16 MED ORDER — CEFAZOLIN SODIUM-DEXTROSE 2-4 GM/100ML-% IV SOLN
INTRAVENOUS | Status: AC
  Filled 2021-04-16: qty 100

## 2021-04-16 MED ORDER — ACETAMINOPHEN 500 MG PO TABS
1000.0000 mg | ORAL_TABLET | Freq: Once | ORAL | Status: AC
  Administered 2021-04-16: 1000 mg via ORAL

## 2021-04-16 MED ORDER — LIDOCAINE 2% (20 MG/ML) 5 ML SYRINGE
INTRAMUSCULAR | Status: AC
  Filled 2021-04-16: qty 5

## 2021-04-16 MED ORDER — PROPOFOL 10 MG/ML IV BOLUS
INTRAVENOUS | Status: AC
  Filled 2021-04-16: qty 20

## 2021-04-16 MED ORDER — LACTATED RINGERS IV SOLN: INTRAVENOUS | Status: DC

## 2021-04-16 MED ORDER — OXYCODONE-ACETAMINOPHEN 5-325 MG PO TABS: 1.0000 | ORAL_TABLET | ORAL | 0 refills | Status: DC | PRN

## 2021-04-16 MED ORDER — HYDROMORPHONE HCL 1 MG/ML IJ SOLN: 0.2500 mg | INTRAMUSCULAR | Status: DC | PRN

## 2021-04-16 MED ORDER — DEXAMETHASONE SODIUM PHOSPHATE 10 MG/ML IJ SOLN
INTRAMUSCULAR | Status: AC
  Filled 2021-04-16: qty 1

## 2021-04-16 MED ORDER — DEXAMETHASONE SODIUM PHOSPHATE 10 MG/ML IJ SOLN
INTRAMUSCULAR | Status: DC | PRN
  Administered 2021-04-16: 4 mg via INTRAVENOUS

## 2021-04-16 MED ORDER — FENTANYL CITRATE (PF) 100 MCG/2ML IJ SOLN
50.0000 ug | Freq: Once | INTRAMUSCULAR | Status: AC
  Administered 2021-04-16: 100 ug via INTRAVENOUS

## 2021-04-16 MED ORDER — PROPOFOL 10 MG/ML IV BOLUS
INTRAVENOUS | Status: DC | PRN
  Administered 2021-04-16: 50 mg via INTRAVENOUS
  Administered 2021-04-16: 150 mg via INTRAVENOUS

## 2021-04-16 MED ORDER — ONDANSETRON HCL 4 MG/2ML IJ SOLN
INTRAMUSCULAR | Status: AC
  Filled 2021-04-16: qty 2

## 2021-04-16 MED ORDER — MIDAZOLAM HCL 2 MG/2ML IJ SOLN
1.0000 mg | Freq: Once | INTRAMUSCULAR | Status: AC
  Administered 2021-04-16: 2 mg via INTRAVENOUS

## 2021-04-16 MED ORDER — LIDOCAINE 2% (20 MG/ML) 5 ML SYRINGE
INTRAMUSCULAR | Status: DC | PRN
  Administered 2021-04-16: 40 mg via INTRAVENOUS

## 2021-04-16 MED ORDER — MIDAZOLAM HCL 2 MG/2ML IJ SOLN
INTRAMUSCULAR | Status: AC
  Filled 2021-04-16: qty 2

## 2021-04-16 MED ORDER — ACETAMINOPHEN 500 MG PO TABS
ORAL_TABLET | ORAL | Status: AC
  Filled 2021-04-16: qty 2

## 2021-04-16 MED ORDER — BUPIVACAINE-EPINEPHRINE (PF) 0.5% -1:200000 IJ SOLN
INTRAMUSCULAR | Status: DC | PRN
  Administered 2021-04-16: 30 mL via PERINEURAL

## 2021-04-16 MED ORDER — ONDANSETRON HCL 4 MG/2ML IJ SOLN
INTRAMUSCULAR | Status: DC | PRN
  Administered 2021-04-16: 4 mg via INTRAVENOUS

## 2021-04-16 MED ORDER — ASPIRIN EC 325 MG PO TBEC: 325.0000 mg | DELAYED_RELEASE_TABLET | Freq: Every day | ORAL | 0 refills | Status: AC

## 2021-04-16 MED ORDER — CEFAZOLIN SODIUM-DEXTROSE 2-4 GM/100ML-% IV SOLN
2.0000 g | INTRAVENOUS | Status: AC
  Administered 2021-04-16: 2 g via INTRAVENOUS

## 2021-04-16 SURGICAL SUPPLY — 48 items
BLADE OSC/SAG .038X5.5 CUT EDG (BLADE) IMPLANT
BLADE SURG 10 STRL SS (BLADE) ×2 IMPLANT
BLADE SURG 15 STRL LF DISP TIS (BLADE) ×1 IMPLANT
BLADE SURG 15 STRL SS (BLADE) ×2
BNDG CMPR 9X4 STRL LF SNTH (GAUZE/BANDAGES/DRESSINGS) ×1
BNDG COHESIVE 4X5 TAN STRL (GAUZE/BANDAGES/DRESSINGS) ×2 IMPLANT
BNDG ESMARK 4X9 LF (GAUZE/BANDAGES/DRESSINGS) ×2 IMPLANT
BOOT STEPPER DURA MED (SOFTGOODS) ×2 IMPLANT
COVER BACK TABLE 60X90IN (DRAPES) ×2 IMPLANT
COVER WAND RF STERILE (DRAPES) IMPLANT
DECANTER SPIKE VIAL GLASS SM (MISCELLANEOUS) IMPLANT
DRAPE EXTREMITY T 121X128X90 (DISPOSABLE) ×2 IMPLANT
DRAPE U-SHAPE 47X51 STRL (DRAPES) ×2 IMPLANT
DRSG EMULSION OIL 3X3 NADH (GAUZE/BANDAGES/DRESSINGS) ×2 IMPLANT
DURAPREP 26ML APPLICATOR (WOUND CARE) ×2 IMPLANT
ELECT REM PT RETURN 9FT ADLT (ELECTROSURGICAL) ×2
ELECTRODE REM PT RTRN 9FT ADLT (ELECTROSURGICAL) ×1 IMPLANT
GAUZE 4X4 16PLY RFD (DISPOSABLE) IMPLANT
GAUZE SPONGE 4X4 12PLY STRL (GAUZE/BANDAGES/DRESSINGS) ×2 IMPLANT
GLOVE SURG ORTHO LTX SZ9 (GLOVE) ×2 IMPLANT
GLOVE SURG UNDER POLY LF SZ9 (GLOVE) ×2 IMPLANT
GOWN STRL REUS W/ TWL LRG LVL3 (GOWN DISPOSABLE) IMPLANT
GOWN STRL REUS W/ TWL XL LVL3 (GOWN DISPOSABLE) ×1 IMPLANT
GOWN STRL REUS W/TWL LRG LVL3 (GOWN DISPOSABLE)
GOWN STRL REUS W/TWL XL LVL3 (GOWN DISPOSABLE) ×4 IMPLANT
NEEDLE HYPO 25X1 1.5 SAFETY (NEEDLE) IMPLANT
NS IRRIG 1000ML POUR BTL (IV SOLUTION) ×2 IMPLANT
PACK BASIN DAY SURGERY FS (CUSTOM PROCEDURE TRAY) ×2 IMPLANT
PAD CAST 4YDX4 CTTN HI CHSV (CAST SUPPLIES) ×1 IMPLANT
PADDING CAST ABS 4INX4YD NS (CAST SUPPLIES) ×1
PADDING CAST ABS COTTON 4X4 ST (CAST SUPPLIES) ×1 IMPLANT
PADDING CAST COTTON 4X4 STRL (CAST SUPPLIES) ×2
PENCIL SMOKE EVACUATOR (MISCELLANEOUS) ×2 IMPLANT
SPONGE LAP 18X18 RF (DISPOSABLE) ×2 IMPLANT
STOCKINETTE 6  STRL (DRAPES) ×2
STOCKINETTE 6 STRL (DRAPES) ×1 IMPLANT
SUT ETHILON 2 0 FSLX (SUTURE) ×2 IMPLANT
SUT ETHILON 3 0 FSLX (SUTURE) ×2 IMPLANT
SUT ETHILON 5 0 PS 2 18 (SUTURE) IMPLANT
SUT FIBERWIRE 2-0 18 17.9 3/8 (SUTURE)
SUT VIC AB 2-0 CT1 27 (SUTURE)
SUT VIC AB 2-0 CT1 TAPERPNT 27 (SUTURE) IMPLANT
SUT VICRYL 4-0 PS2 18IN ABS (SUTURE) IMPLANT
SUTURE FIBERWR 2-0 18 17.9 3/8 (SUTURE) IMPLANT
SYR BULB EAR ULCER 3OZ GRN STR (SYRINGE) ×2 IMPLANT
SYR CONTROL 10ML LL (SYRINGE) IMPLANT
TOWEL GREEN STERILE FF (TOWEL DISPOSABLE) ×4 IMPLANT
UNDERPAD 30X36 HEAVY ABSORB (UNDERPADS AND DIAPERS) ×2 IMPLANT

## 2021-04-16 NOTE — Discharge Instructions (Signed)

## 2021-04-16 NOTE — Transfer of Care (Signed)
Immediate Anesthesia Transfer of Care Note  Patient: Tashawnda Dhawan  Procedure(s) Performed: REMOVAL OF HARDWARE LEFT ANKLE (Left Leg Lower) LEFT ANTERIOR COMPARTMENT RELEASE (Left Leg Lower)  Patient Location: PACU  Anesthesia Type:General  Level of Consciousness: awake, alert  and oriented  Airway & Oxygen Therapy: Patient Spontanous Breathing and Patient connected to face mask oxygen  Post-op Assessment: Report given to RN, Post -op Vital signs reviewed and stable and Patient moving all extremities X 4  Post vital signs: Reviewed and stable  Last Vitals:  Vitals Value Taken Time  BP    Temp 36.3 C 04/16/21 0916  Pulse 84 04/16/21 0917  Resp 7 04/16/21 0917  SpO2 93 % 04/16/21 0917    Last Pain:  Vitals:   04/16/21 0744  TempSrc: Oral  PainSc: 7       Patients Stated Pain Goal: 5 (04/16/21 0744)  Complications: No complications documented.

## 2021-04-16 NOTE — Anesthesia Procedure Notes (Signed)
Procedure Name: LMA Insertion Date/Time: 04/16/2021 8:34 AM Performed by: Nelle Don, CRNA Pre-anesthesia Checklist: Patient identified, Emergency Drugs available, Suction available and Patient being monitored Patient Re-evaluated:Patient Re-evaluated prior to induction Oxygen Delivery Method: Circle system utilized Preoxygenation: Pre-oxygenation with 100% oxygen Induction Type: IV induction LMA: LMA inserted LMA Size: 4.0 Number of attempts: 1 Dental Injury: Teeth and Oropharynx as per pre-operative assessment  Comments: Patient reported 1 loose tooth prior. Tooth still intact, LMA passed easily.

## 2021-04-16 NOTE — Anesthesia Procedure Notes (Signed)
Anesthesia Regional Block: Popliteal block   Pre-Anesthetic Checklist: ,, timeout performed, Correct Patient, Correct Site, Correct Laterality, Correct Procedure, Correct Position, site marked, Risks and benefits discussed, pre-op evaluation,  At surgeon's request and post-op pain management  Laterality: Left  Prep: Maximum Sterile Barrier Precautions used, chloraprep       Needles:  Injection technique: Single-shot  Needle Type: Echogenic Stimulator Needle     Needle Length: 9cm  Needle Gauge: 21     Additional Needles:   Procedures:,,,, ultrasound used (permanent image in chart),,,,  Narrative:  Start time: 04/16/2021 8:02 AM End time: 04/16/2021 8:12 AM Injection made incrementally with aspirations every 5 mL. Anesthesiologist: Gaynelle Adu, MD  Additional Notes: 2% Lidocaine skin wheel.

## 2021-04-16 NOTE — Op Note (Signed)
04/16/2021  9:19 AM  PATIENT:  Kaylee Simpson    PRE-OPERATIVE DIAGNOSIS:  LEFT ANKLE PAINFUL HARDWARE, PERONEAL NERVE ENTRAPMENT  POST-OPERATIVE DIAGNOSIS:  Same  PROCEDURE:  REMOVAL OF HARDWARE LEFT ANKLE, LEFT ANTERIOR COMPARTMENT RELEASE  SURGEON:  Nadara Mustard, MD  PHYSICIAN ASSISTANT:None ANESTHESIA:   General  PREOPERATIVE INDICATIONS:  [name] is a  46 y.o. female with a diagnosis of LEFT ANKLE PAINFUL HARDWARE, PERONEAL NERVE ENTRAPMENT who failed conservative measures and elected for surgical management.    The risks benefits and alternatives were discussed with the patient preoperatively including but not limited to the risks of infection, bleeding, nerve injury, cardiopulmonary complications, the need for revision surgery, among others, and the patient was willing to proceed.  OPERATIVE IMPLANTS: none  @ENCIMAGES @  OPERATIVE FINDINGS: Patient had entrapment of the superficial peroneal nerve within the scar tissue of the fibular plate.  The superficial peroneal nerve was decompressed and the anterior compartment was released.  OPERATIVE PROCEDURE: Patient was brought the operating room after undergoing a regional anesthetic she then underwent general anesthesia.  After adequate levels anesthesia were obtained patient's left lower extremity was prepped using DuraPrep draped into a sterile field a timeout was called.  An Esmarch was placed around the calf for tourniquet control.  A incision was made laterally over the previous fibular incision.  This was carried sharply down to bone.  A subperiosteal dissector was used to remove the scar tissue from the plate.  The 6 screws and plate were removed without complications.  The fibula was further debrided.  The superficial peroneal nerve was entrapped within the scar tissue of the fibular plate using dissecting scissors the superficial peroneal nerve was decompressed and the anterior compartment was released.  The wound was  irrigated with normal saline.  The incision was closed using 2-0 nylon a sterile dressing was applied the Esmarch was released patient was extubated taken the PACU in stable condition.   DISCHARGE PLANNING:  Antibiotic duration: Preoperative antibiotics only  Weightbearing: Weightbearing as tolerated  Pain medication: Prescription for Percocet  Dressing care/ Wound VAC: Change dressing in 1 week in the office  Ambulatory devices: Cam walker and crutches  Discharge to: Home.  Follow-up: In the office 1 week post operative.

## 2021-04-16 NOTE — H&P (Signed)
Kaylee Simpson is an 45 y.o. female.   Chief Complaint: Left Ankle pain Kaylee Simpson presents today for follow-up on her left ankle pain.  She is status post open reduction internal fixation of a left fibula fracture and subsequently had fracture above the plate.  She saw Dr. Lajoyce Corners a couple weeks ago for ongoing pain in the distribution of the superficial peroneal nerve.  Specifically where it exits the anterior compartment.  He felt this was most likely due to scar tissue from the second fracture at the proximal aspect of the plate.  The area of irritation was injected and she reports she had about 10 minutes of relief and now the pain has returned she said that Dr. Lajoyce Corners did speak with her regarding removal of the hardware and exploration and debridement of the superficial nerve as it exited the anterior compartment.  She feels she has exhausted conservative treatment and would like to go forward with this  Past Medical History:  Diagnosis Date  . Acute metabolic encephalopathy 07/15/2020  . Chronic ankle pain   . Complication of anesthesia   . Drug overdose, accidental or unintentional, initial encounter 07/15/2020  . Hypertension    not a problem  . Pneumonia 07/15/2020   aspiration  . Polysubstance abuse (HCC)   . PONV (postoperative nausea and vomiting)   . Pyelonephritis "several times"  . Sepsis (HCC) 07/15/2020    Past Surgical History:  Procedure Laterality Date  . DILATION AND CURETTAGE OF UTERUS  1997; 1998  . FRACTURE SURGERY    . ORIF ANKLE FRACTURE Left 04/16/2016   Procedure: OPEN REDUCTION INTERNAL FIXATION (ORIF) LEFT BIMALLEOLAR ANKLE FRACTURE WITH SYNDESMOSIS SCREW;  Surgeon: Eldred Manges, MD;  Location: [facility];  Service: Orthopedics;  Laterality: Left;  . ORIF ANKLE FRACTURE BIMALLEOLAR Left 04/16/2016   ORIF syndesmosis.  Marland Kitchen PERCUTANEOUS PINNING TOE FRACTURE Right 2011   great toe and middle    History reviewed. No pertinent family history. Social History:  reports that  she has been smoking cigarettes. She has a 2.50 pack-year smoking history. She uses smokeless tobacco. She reports current alcohol use of about 6.0 standard drinks of alcohol per week. She reports current drug use. Drug: Marijuana.  Allergies:  Allergies  Allergen Reactions  . Gabapentin Other (See Comments)    Mucscle twitching     No medications prior to admission.    Results for orders placed or performed during the hospital encounter of 04/16/21 (from the past 48 hour(s))  Basic metabolic panel per protocol     Status: Abnormal   Collection Time: 04/15/21  2:17 PM  Result Value Ref Range   Sodium 136 135 - 145 mmol/L   Potassium 3.5 3.5 - 5.1 mmol/L   Chloride 102 98 - 111 mmol/L   CO2 25 22 - 32 mmol/L   Glucose, Bld 105 (H) 70 - 99 mg/dL    Comment: Glucose reference range applies only to samples taken after fasting for at least 8 hours.   BUN <5 (L) 6 - 20 mg/dL   Creatinine, Ser 1.01 0.44 - 1.00 mg/dL   Calcium 8.9 8.9 - 75.1 mg/dL   GFR, Estimated >02 >58 mL/min    Comment: (NOTE) Calculated using the CKD-EPI Creatinine Equation (2021)    Anion gap 9 5 - 15    Comment: Performed at [facility], [address] [place], [place] [address]   No results found.  Review of Systems  All other systems reviewed and are negative.  Height 5\' 5"  (1.651 m), weight 76.7 kg, last menstrual period 04/09/2021. Physical Exam  Patient is alert, oriented, no adenopathy, well-dressed, normal affect, normal respiratory effort. Examination of her left ankle she has good pulses nontender to palpation over the ankle mortise.  She has point tenderness to palpation over the superficial peroneal nerve where it exits the anterior compartment Heart RRR Lungs Clear Assessment/Plan Plan: I did discuss with the patient outcomes expectations of the surgery.  We could do this as a day surgery.  I did discuss it with Dr. 06/09/2021 and he is fine with going forward we will schedule this at her  convenience  Lajoyce Corners [name], PA 04/16/2021, 6:34 AM

## 2021-04-16 NOTE — Progress Notes (Signed)
Assisted Dr. Edmond Fitzgerald with left, ultrasound guided, popliteal block. Side rails up, monitors on throughout procedure. See vital signs in flow sheet. Tolerated Procedure well. °

## 2021-04-16 NOTE — Interval H&P Note (Signed)
History and Physical Interval Note:  04/16/2021 8:18 AM  Kaylee Simpson  has presented today for surgery, with the diagnosis of LEFT ANKLE PAINFUL HARDWARE, PERONEAL NERVE ENTRAPMENT.  The various methods of treatment have been discussed with the patient and family. After consideration of risks, benefits and other options for treatment, the patient has consented to  Procedure(s): REMOVAL OF HARDWARE LEFT ANKLE (Left) LEFT ANTERIOR COMPARTMENT RELEASE (Left) as a surgical intervention.  The patient's history has been reviewed, patient examined, no change in status, stable for surgery.  I have reviewed the patient's chart and labs.  Questions were answered to the patient's satisfaction.     Nadara Mustard

## 2021-04-16 NOTE — Anesthesia Postprocedure Evaluation (Signed)
Anesthesia Post Note  Patient: Office manager  Procedure(s) Performed: REMOVAL OF HARDWARE LEFT ANKLE (Left Leg Lower) LEFT ANTERIOR COMPARTMENT RELEASE (Left Leg Lower)     Patient location during evaluation: PACU Anesthesia Type: General and Regional Level of consciousness: awake and alert Pain management: pain level controlled Vital Signs Assessment: post-procedure vital signs reviewed and stable Respiratory status: spontaneous breathing, nonlabored ventilation and respiratory function stable Cardiovascular status: blood pressure returned to baseline and stable Postop Assessment: no apparent nausea or vomiting Anesthetic complications: no   No complications documented.  Last Vitals:  Vitals:   04/16/21 0916 04/16/21 0930  BP: 133/90 (!) 146/86  Pulse: 84 73  Resp: (!) 7 10  Temp: (!) 36.3 C   SpO2: 93% 100%    Last Pain:  Vitals:   04/16/21 0939  TempSrc:   PainSc: 0-No pain                 Hani Patnode,W. EDMOND

## 2021-04-16 NOTE — Anesthesia Preprocedure Evaluation (Addendum)
Anesthesia Evaluation  Patient identified by MRN, date of birth, ID band Patient awake    Reviewed: Allergy & Precautions, H&P , NPO status , Patient's Chart, lab work & pertinent test results  History of Anesthesia Complications (+) PONV  Airway Mallampati: II  TM Distance: >3 FB Neck ROM: Full    Dental no notable dental hx. (+) Teeth Intact, Dental Advisory Given   Pulmonary Current Smoker and Patient abstained from smoking.,    Pulmonary exam normal breath sounds clear to auscultation       Cardiovascular hypertension, Pt. on medications  Rhythm:Regular Rate:Normal     Neuro/Psych negative neurological ROS  negative psych ROS   GI/Hepatic negative GI ROS, Neg liver ROS,   Endo/Other  negative endocrine ROS  Renal/GU negative Renal ROS  negative genitourinary   Musculoskeletal   Abdominal   Peds  Hematology  (+) Blood dyscrasia, anemia ,   Anesthesia Other Findings   Reproductive/Obstetrics negative OB ROS                            Anesthesia Physical Anesthesia Plan  ASA: II  Anesthesia Plan: General   Post-op Pain Management:  Regional for Post-op pain   Induction: Intravenous  PONV Risk Score and Plan: 4 or greater and Ondansetron, Dexamethasone and Midazolam  Airway Management Planned: LMA  Additional Equipment:   Intra-op Plan:   Post-operative Plan: Extubation in OR  Informed Consent: I have reviewed the patients History and Physical, chart, labs and discussed the procedure including the risks, benefits and alternatives for the proposed anesthesia with the patient or authorized representative who has indicated his/her understanding and acceptance.     Dental advisory given  Plan Discussed with: CRNA  Anesthesia Plan Comments: (Pt has normal motor function of the peroneal nerve preoperatively.)       Anesthesia Quick Evaluation

## 2021-04-17 ENCOUNTER — Encounter (HOSPITAL_BASED_OUTPATIENT_CLINIC_OR_DEPARTMENT_OTHER): Payer: Self-pay | Admitting: Orthopedic Surgery

## 2021-04-23 ENCOUNTER — Ambulatory Visit: Payer: Medicaid Other | Admitting: Orthopedic Surgery

## 2021-04-24 ENCOUNTER — Ambulatory Visit (INDEPENDENT_AMBULATORY_CARE_PROVIDER_SITE_OTHER): Payer: Medicaid Other | Admitting: Physician Assistant

## 2021-04-24 ENCOUNTER — Encounter: Payer: Self-pay | Admitting: Physician Assistant

## 2021-04-24 DIAGNOSIS — M25572 Pain in left ankle and joints of left foot: Secondary | ICD-10-CM

## 2021-04-24 MED ORDER — OXYCODONE-ACETAMINOPHEN 5-325 MG PO TABS: 1.0000 | ORAL_TABLET | ORAL | 0 refills | Status: AC | PRN

## 2021-04-24 MED ORDER — MELOXICAM 15 MG PO TABS: 15.0000 mg | ORAL_TABLET | Freq: Every day | ORAL | 2 refills | Status: AC

## 2021-04-24 NOTE — Progress Notes (Signed)
Office Visit Note   Patient: Kaylee Simpson           Date of Birth: 08/31/1976           MRN: [id number] Visit Date: 04/24/2021              Requested by: Fleet Contras, MD [address] [place],  [place] [address] PCP: Fleet Contras, MD  Chief Complaint  Patient presents with  . Left Ankle - Routine Post Op    04/16/21 removal HDW left ankle       HPI: Patient presents today 8 days status post removal of hardware of left ankle and anterior compartment decompression.  She says her neuropathic symptoms have significantly improved.  She does have quite a bit of pain and bruising around the incision itself.  Assessment & Plan: Visit Diagnoses: No diagnosis found.  Plan: We will begin Mobic 15 mg daily with breakfast.  I will give her a refill of her pain medication.  She may begin daily cleansing with Dial soap and water.  She can work on ankle range of motion.  Follow-up in 1 week.  Follow-Up Instructions: No follow-ups on file.   Ortho Exam  Patient is alert, oriented, no adenopathy, well-dressed, normal affect, normal respiratory effort. Examination demonstrates well apposed wound edges she has moderate amount of ecchymosis but compartments are soft and compressible.  She has good dorsiflexion plantarflexion of her ankle.  Mild plus soft tissue swelling  Imaging: No results found. No images are attached to the encounter.  Labs: Lab Results  Component Value Date   ESRSEDRATE 18 07/10/2015   ESRSEDRATE >140 (H) 10/07/2012   REPTSTATUS 07/19/2020 FINAL 07/14/2020   CULT  07/14/2020    NO GROWTH 5 DAYS Performed at [facility], [address] [place], [place] [address]      Lab Results  Component Value Date   ALBUMIN 2.7 (L) 07/16/2020   ALBUMIN 3.7 07/14/2020   ALBUMIN 3.5 09/07/2018    Lab Results  Component Value Date   MG 2.0 10/05/2012   No results found for: VD25OH  No results found for: PREALBUMIN CBC EXTENDED Latest Ref Rng & Units  07/16/2020 07/15/2020 07/14/2020  WBC 4.0 - 10.5 K/uL 9.0 16.5(H) 10.5  RBC 3.87 - 5.11 MIL/uL 3.75(L) 4.25 4.74  HGB 12.0 - 15.0 g/dL 11.8(L) 13.2 15.1(H)  HCT 36.0 - 46.0 % 37.9 43.4 50.3(H)  PLT 150 - 400 K/uL 186 234 279  NEUTROABS 1.7 - 7.7 K/uL - - -  LYMPHSABS 0.7 - 4.0 K/uL - - -     There is no height or weight on file to calculate BMI.  Orders:  No orders of the defined types were placed in this encounter.  No orders of the defined types were placed in this encounter.    Procedures: No procedures performed  Clinical Data: No additional findings.  ROS:  All other systems negative, except as noted in the HPI. Review of Systems  Objective: Vital Signs: LMP 04/09/2021   Specialty Comments:  No specialty comments available.  PMFS History: Patient Active Problem List   Diagnosis Date Noted  . Traumatic compartment syndrome of left lower extremity (HCC)   . Pain from implanted hardware   . Acute metabolic encephalopathy 07/15/2020  . Sepsis (HCC) 07/15/2020  . Aspiration pneumonia of both lower lobes due to gastric secretions (HCC) 07/15/2020  . Polysubstance abuse (HCC) 07/15/2020  . Drug overdose 07/15/2020  . Drug overdose, accidental or unintentional, initial  encounter 07/14/2020  . Closed nondisplaced oblique fracture of shaft of fibula with routine healing 12/05/2019  . Trochanteric bursitis, left hip 05/24/2018  . Pain in left ankle and joints of left foot 05/24/2018  . Chronic pain of left ankle 12/10/2016  . Status post open reduction with internal fixation (ORIF) of fracture of ankle 12/10/2016  . Status post ORIF of fracture of ankle 04/16/2016  . Anemia 10/08/2012  . Pyelonephritis 10/05/2012  . Hypokalemia 10/05/2012  . Hyponatremia 10/05/2012  . Tachycardia 10/05/2012  . SIRS (systemic inflammatory response syndrome) (HCC) 10/05/2012  . Acute renal insufficiency 10/05/2012   Past Medical History:  Diagnosis Date  . Acute metabolic  encephalopathy 07/15/2020  . Chronic ankle pain   . Complication of anesthesia   . Drug overdose, accidental or unintentional, initial encounter 07/15/2020  . Hypertension    not a problem  . Pneumonia 07/15/2020   aspiration  . Polysubstance abuse (HCC)   . PONV (postoperative nausea and vomiting)   . Pyelonephritis "several times"  . Sepsis (HCC) 07/15/2020    No family history on file.  Past Surgical History:  Procedure Laterality Date  . DILATION AND CURETTAGE OF UTERUS  1997; 1998  . FASCIOTOMY Left 04/16/2021   Procedure: LEFT ANTERIOR COMPARTMENT RELEASE;  Surgeon: Nadara Mustard, MD;  Location: [facility];  Service: Orthopedics;  Laterality: Left;  . FRACTURE SURGERY    . HARDWARE REMOVAL Left 04/16/2021   Procedure: REMOVAL OF HARDWARE LEFT ANKLE;  Surgeon: Nadara Mustard, MD;  Location: [facility];  Service: Orthopedics;  Laterality: Left;  . ORIF ANKLE FRACTURE Left 04/16/2016   Procedure: OPEN REDUCTION INTERNAL FIXATION (ORIF) LEFT BIMALLEOLAR ANKLE FRACTURE WITH SYNDESMOSIS SCREW;  Surgeon: Eldred Manges, MD;  Location: [facility];  Service: Orthopedics;  Laterality: Left;  . ORIF ANKLE FRACTURE BIMALLEOLAR Left 04/16/2016   ORIF syndesmosis.  Marland Kitchen PERCUTANEOUS PINNING TOE FRACTURE Right 2011   great toe and middle   Social History   Occupational History  . Not on file  Tobacco Use  . Smoking status: Current Every Day Smoker    Packs/day: 0.25    Years: 10.00    Pack years: 2.50    Types: Cigarettes  . Smokeless tobacco: Current User  Substance and Sexual Activity  . Alcohol use: Yes    Alcohol/week: 6.0 standard drinks    Types: 6 Cans of beer per week    Comment: Social  . Drug use: Yes    Types: Marijuana  . Sexual activity: Yes

## 2021-04-24 NOTE — Addendum Note (Signed)
Addended by: Polly Cobia on: 04/24/2021 04:11 PM   Modules accepted: Orders

## 2021-04-26 ENCOUNTER — Ambulatory Visit: Payer: Medicaid Other | Admitting: Family

## 2021-04-28 DIAGNOSIS — Z419 Encounter for procedure for purposes other than remedying health state, unspecified: Secondary | ICD-10-CM | POA: Diagnosis not present

## 2021-05-01 ENCOUNTER — Ambulatory Visit: Payer: Medicaid Other | Admitting: Physician Assistant

## 2021-05-03 ENCOUNTER — Ambulatory Visit: Payer: Medicaid Other | Admitting: Family

## 2021-05-10 ENCOUNTER — Encounter: Payer: Medicaid Other | Admitting: Family

## 2021-05-11 ENCOUNTER — Encounter (HOSPITAL_COMMUNITY): Payer: Self-pay | Admitting: *Deleted

## 2021-05-11 ENCOUNTER — Ambulatory Visit (HOSPITAL_COMMUNITY)
Admission: EM | Admit: 2021-05-11 | Discharge: 2021-05-11 | Disposition: A | Discharge status: Home / Self Care | Payer: Medicaid Other

## 2021-05-11 ENCOUNTER — Telehealth: Payer: Medicaid Other | Admitting: Orthopedic Surgery

## 2021-05-11 ENCOUNTER — Other Ambulatory Visit: Payer: Self-pay

## 2021-05-11 DIAGNOSIS — T8141XA Infection following a procedure, superficial incisional surgical site, initial encounter: Secondary | ICD-10-CM

## 2021-05-11 DIAGNOSIS — Z4802 Encounter for removal of sutures: Secondary | ICD-10-CM

## 2021-05-11 DIAGNOSIS — M25572 Pain in left ankle and joints of left foot: Secondary | ICD-10-CM

## 2021-05-11 MED ORDER — SULFAMETHOXAZOLE-TRIMETHOPRIM 800-160 MG PO TABS: 1.0000 | ORAL_TABLET | Freq: Two times a day (BID) | ORAL | 0 refills | Status: AC

## 2021-05-11 MED ORDER — BACITRACIN ZINC 500 UNIT/GM EX OINT
TOPICAL_OINTMENT | CUTANEOUS | Status: AC
  Filled 2021-05-11: qty 3.6

## 2021-05-11 MED ORDER — ACETAMINOPHEN 325 MG PO TABS
650.0000 mg | ORAL_TABLET | Freq: Once | ORAL | Status: AC
  Administered 2021-05-11: 650 mg via ORAL

## 2021-05-11 MED ORDER — ACETAMINOPHEN 325 MG PO TABS
ORAL_TABLET | ORAL | Status: AC
  Filled 2021-05-11: qty 2

## 2021-05-11 MED ORDER — KETOROLAC TROMETHAMINE 60 MG/2ML IM SOLN
60.0000 mg | Freq: Once | INTRAMUSCULAR | Status: AC
  Administered 2021-05-11: 60 mg via INTRAMUSCULAR

## 2021-05-11 MED ORDER — HIBICLENS 4 % EX LIQD: Freq: Every day | CUTANEOUS | 0 refills | Status: AC | PRN

## 2021-05-11 MED ORDER — KETOROLAC TROMETHAMINE 60 MG/2ML IM SOLN
INTRAMUSCULAR | Status: AC
  Filled 2021-05-11: qty 2

## 2021-05-11 NOTE — Progress Notes (Signed)
Pt did not connect to video call.

## 2021-05-11 NOTE — ED Triage Notes (Signed)
Pt missed the appt on Tuesday for suture removal and now has swelling to Lt lateral leg.

## 2021-05-14 NOTE — ED Provider Notes (Signed)
[facility]    CSN: [id number] Arrival date & time: 05/11/21  1447      History   Chief Complaint Chief Complaint  Patient presents with  . Post-op Problem    HPI Kaylee Simpson is a 45 y.o. female.   Patient presenting today with swelling, redness, significant pain at recent surgical site left lateral ankle.  She states on 04/16/2021 she had orthopedic surgery to remove hardware in her left ankle, was supposed to get her sutures out 1 week ago with orthopedic surgeon but had a job interview that day so missed the appointment.  She left the ankle wrapped in a dressing since that time, has not been cleaning it with anything or applying anything topically.  She states the past few days she has noticed significant color change to the surrounding area, and finally took her bandage off this morning and noticed the infection and that the sutures were becoming embedded into her skin.  She denies fever, chills, sweats, body aches, nausea vomiting or diarrhea.  Has not been trying anything for symptoms.  Per chart review, had a video visit scheduled with orthopedics this morning but missed the appointment.     Past Medical History:  Diagnosis Date  . Acute metabolic encephalopathy 07/15/2020  . Chronic ankle pain   . Complication of anesthesia   . Drug overdose, accidental or unintentional, initial encounter 07/15/2020  . Hypertension    not a problem  . Pneumonia 07/15/2020   aspiration  . Polysubstance abuse (HCC)   . PONV (postoperative nausea and vomiting)   . Pyelonephritis "several times"  . Sepsis (HCC) 07/15/2020    Patient Active Problem List   Diagnosis Date Noted  . Traumatic compartment syndrome of left lower extremity (HCC)   . Pain from implanted hardware   . Acute metabolic encephalopathy 07/15/2020  . Sepsis (HCC) 07/15/2020  . Aspiration pneumonia of both lower lobes due to gastric secretions (HCC) 07/15/2020  . Polysubstance abuse (HCC) 07/15/2020  .  Drug overdose 07/15/2020  . Drug overdose, accidental or unintentional, initial encounter 07/14/2020  . Closed nondisplaced oblique fracture of shaft of fibula with routine healing 12/05/2019  . Trochanteric bursitis, left hip 05/24/2018  . Pain in left ankle and joints of left foot 05/24/2018  . Chronic pain of left ankle 12/10/2016  . Status post open reduction with internal fixation (ORIF) of fracture of ankle 12/10/2016  . Status post ORIF of fracture of ankle 04/16/2016  . Anemia 10/08/2012  . Pyelonephritis 10/05/2012  . Hypokalemia 10/05/2012  . Hyponatremia 10/05/2012  . Tachycardia 10/05/2012  . SIRS (systemic inflammatory response syndrome) (HCC) 10/05/2012  . Acute renal insufficiency 10/05/2012    Past Surgical History:  Procedure Laterality Date  . DILATION AND CURETTAGE OF UTERUS  1997; 1998  . FASCIOTOMY Left 04/16/2021   Procedure: LEFT ANTERIOR COMPARTMENT RELEASE;  Surgeon: Nadara Mustard, MD;  Location: [facility];  Service: Orthopedics;  Laterality: Left;  . FRACTURE SURGERY    . HARDWARE REMOVAL Left 04/16/2021   Procedure: REMOVAL OF HARDWARE LEFT ANKLE;  Surgeon: Nadara Mustard, MD;  Location: [facility];  Service: Orthopedics;  Laterality: Left;  . ORIF ANKLE FRACTURE Left 04/16/2016   Procedure: OPEN REDUCTION INTERNAL FIXATION (ORIF) LEFT BIMALLEOLAR ANKLE FRACTURE WITH SYNDESMOSIS SCREW;  Surgeon: Eldred Manges, MD;  Location: [facility];  Service: Orthopedics;  Laterality: Left;  . ORIF ANKLE FRACTURE BIMALLEOLAR Left 04/16/2016   ORIF syndesmosis.  Marland Kitchen PERCUTANEOUS PINNING  TOE FRACTURE Right 2011   great toe and middle    OB History    Gravida  6   Para  4   Term      Preterm  4   AB  2   Living  4     SAB      IAB      Ectopic      Multiple      Live Births  4            Home Medications    Prior to Admission medications   Medication Sig Start Date End Date Taking? Authorizing Provider  chlorhexidine  (HIBICLENS) 4 % external liquid Apply topically daily as needed. 05/11/21  Yes Particia Nearing, PA-C  sulfamethoxazole-trimethoprim (BACTRIM DS) 800-160 MG tablet Take 1 tablet by mouth 2 (two) times daily for 7 days. 05/11/21 05/18/21 Yes Particia Nearing, PA-C  aspirin EC 325 MG tablet Take 1 tablet (325 mg total) by mouth daily. 04/16/21   Persons, [place], PA  gabapentin (NEURONTIN) 300 MG capsule Take 1 capsule by mouth 3 (three) times daily. 04/23/21   [provider]  hydrochlorothiazide (HYDRODIURIL) 12.5 MG tablet Take 12.5 mg by mouth daily. 04/07/21   [provider]  hydrochlorothiazide (MICROZIDE) 12.5 MG capsule Take 12.5 mg by mouth daily.    [provider]  meloxicam (MOBIC) 15 MG tablet Take 1 tablet (15 mg total) by mouth daily. 04/24/21 04/24/22  Persons, [place], PA  Norgestim-Eth Charlott Holler Triphasic (TRI-LO-SPRINTEC PO) Take by mouth.    [provider]  ondansetron (ZOFRAN) 4 MG tablet Take 1 tablet (4 mg total) by mouth daily as needed for nausea or vomiting. 07/16/20 07/16/21  Danford, Earl Lites, MD  oxyCODONE-acetaminophen (PERCOCET) 5-325 MG tablet Take 1 tablet by mouth every 4 (four) hours as needed for severe pain. 04/24/21 04/24/22  Persons, [place], PA  TRI-SPRINTEC 0.18/0.215/0.25 MG-35 MCG tablet Take 1 tablet by mouth daily. 05/06/21   [provider]  Vitamin D, Ergocalciferol, (DRISDOL) 1.25 MG (50000 UNIT) CAPS capsule Take 50,000 Units by mouth every 7 (seven) days.    [provider]    Family History History reviewed. No pertinent family history.  Social History Social History   Tobacco Use  . Smoking status: Current Every Day Smoker    Packs/day: 0.25    Years: 10.00    Pack years: 2.50    Types: Cigarettes  . Smokeless tobacco: Current User  Substance Use Topics  . Alcohol use: Yes    Alcohol/week: 6.0 standard drinks    Types: 6 Cans of beer per week    Comment: Social  . Drug use:  Yes    Types: Marijuana     Allergies   Gabapentin   Review of Systems Review of Systems Per HPI  Physical Exam Triage Vital Signs ED Triage Vitals  Enc Vitals Group     BP 05/11/21 1605 (!) 171/104     Pulse Rate 05/11/21 1605 80     Resp 05/11/21 1605 18     Temp 05/11/21 1605 99.3 F (37.4 C)     Temp Source 05/11/21 1605 Oral     SpO2 05/11/21 1605 100 %     Weight --      Height --      Head Circumference --      Peak Flow --      Pain Score 05/11/21 1602 10     Pain Loc --  Pain Edu? --      Excl. in GC? --    No data found.  Updated Vital Signs BP (!) 171/104   Pulse 80   Temp 99.3 F (37.4 C) (Oral)   Resp 18   LMP 05/11/2021   SpO2 100%   Visual Acuity Right Eye Distance:   Left Eye Distance:   Bilateral Distance:    Right Eye Near:   Left Eye Near:    Bilateral Near:     Physical Exam Vitals and nursing note reviewed.  Constitutional:      Appearance: Normal appearance. She is not ill-appearing.     Comments: Appears to be in pain  HENT:     Head: Atraumatic.  Eyes:     Extraocular Movements: Extraocular movements intact.     Conjunctiva/sclera: Conjunctivae normal.  Cardiovascular:     Rate and Rhythm: Normal rate and regular rhythm.     Heart sounds: Normal heart sounds.  Pulmonary:     Effort: Pulmonary effort is normal.     Breath sounds: Normal breath sounds.  Musculoskeletal:        General: Swelling and tenderness present. Normal range of motion.     Cervical back: Normal range of motion and neck supple.     Comments: Antalgic gait and movement of left ankle  Skin:    General: Skin is warm.     Comments: Significant erythema, edema, thick purulent drainage from vertical incision lateral left ankle at surgical site.  All sutures from recent surgery embedded into infected tissue.    Neurological:     Mental Status: She is alert and oriented to person, place, and time.     Comments: Left lower extremity neurovascularly  intact  Psychiatric:        Mood and Affect: Mood normal.        Thought Content: Thought content normal.        Judgment: Judgment normal.    UC Treatments / Results  Labs (all labs ordered are listed, but only abnormal results are displayed) Labs Reviewed - No data to display  EKG   Radiology No results found.  Procedures Procedures (including critical care time)  Medications Ordered in UC Medications  acetaminophen (TYLENOL) tablet 650 mg (650 mg Oral Given 05/11/21 1613)  ketorolac (TORADOL) injection 60 mg (60 mg Intramuscular Given 05/11/21 1700)    Initial Impression / Assessment and Plan / UC Course  I have reviewed the triage vital signs and the nursing notes.  Pertinent labs & imaging results that were available during my care of the patient were reviewed by me and considered in my medical decision making (see chart for details).     Tylenol and Toradol given in clinic for pain, 30 minutes spent carefully cutting sutures loose from embedded and infected surgical site.  Area irrigated with Hibiclens, covered with ointment, nonstick gauze pads, Coban.  We will start course of Bactrim, supply Hibiclens for home wound care.  Discussed at length how to care for wounds, ED precautions, close orthopedic surgery follow-up for recheck of the area.  She expresses understanding of plan.  50 minutes spent in total and direct patient care and education today.  Final Clinical Impressions(s) / UC Diagnoses   Final diagnoses:  Infection of superficial incisional surgical site after procedure, initial encounter  Visit for suture removal  Acute left ankle pain   Discharge Instructions   None    ED Prescriptions    Medication Sig Dispense Auth.  Provider   sulfamethoxazole-trimethoprim (BACTRIM DS) 800-160 MG tablet Take 1 tablet by mouth 2 (two) times daily for 7 days. 14 tablet Particia Nearing, [place]   chlorhexidine (HIBICLENS) 4 % external liquid Apply topically daily  as needed. 120 mL Particia Nearing, [place]     PDMP not reviewed this encounter.   Particia Nearing, [place] 05/14/21 [phone]

## 2021-05-29 DIAGNOSIS — Z419 Encounter for procedure for purposes other than remedying health state, unspecified: Secondary | ICD-10-CM | POA: Diagnosis not present

## 2021-06-04 DIAGNOSIS — Z7689 Persons encountering health services in other specified circumstances: Secondary | ICD-10-CM | POA: Diagnosis not present

## 2021-06-11 DIAGNOSIS — Z7689 Persons encountering health services in other specified circumstances: Secondary | ICD-10-CM | POA: Diagnosis not present

## 2021-06-28 DIAGNOSIS — Z419 Encounter for procedure for purposes other than remedying health state, unspecified: Secondary | ICD-10-CM | POA: Diagnosis not present

## 2021-07-11 ENCOUNTER — Ambulatory Visit: Payer: Medicaid Other | Admitting: Physician Assistant

## 2021-07-29 DIAGNOSIS — Z419 Encounter for procedure for purposes other than remedying health state, unspecified: Secondary | ICD-10-CM | POA: Diagnosis not present

## 2021-08-02 ENCOUNTER — Encounter (HOSPITAL_COMMUNITY): Payer: Self-pay | Admitting: Pharmacy Technician

## 2021-08-02 ENCOUNTER — Emergency Department (HOSPITAL_COMMUNITY): Payer: Medicaid Other

## 2021-08-02 ENCOUNTER — Encounter (HOSPITAL_COMMUNITY): Payer: Self-pay

## 2021-08-02 ENCOUNTER — Ambulatory Visit (HOSPITAL_COMMUNITY): Admission: EM | Admit: 2021-08-02 | Discharge: 2021-08-02 | Payer: Medicaid Other

## 2021-08-02 ENCOUNTER — Other Ambulatory Visit: Payer: Self-pay

## 2021-08-02 ENCOUNTER — Emergency Department (HOSPITAL_COMMUNITY)
Admission: EM | Admit: 2021-08-02 | Discharge: 2021-08-02 | Disposition: A | Discharge status: Home / Self Care | Payer: Medicaid Other | Attending: Emergency Medicine | Admitting: Emergency Medicine

## 2021-08-02 DIAGNOSIS — I10 Essential (primary) hypertension: Secondary | ICD-10-CM | POA: Diagnosis not present

## 2021-08-02 DIAGNOSIS — R519 Headache, unspecified: Secondary | ICD-10-CM | POA: Insufficient documentation

## 2021-08-02 DIAGNOSIS — Z79899 Other long term (current) drug therapy: Secondary | ICD-10-CM | POA: Diagnosis not present

## 2021-08-02 DIAGNOSIS — H5712 Ocular pain, left eye: Secondary | ICD-10-CM | POA: Diagnosis not present

## 2021-08-02 DIAGNOSIS — F17210 Nicotine dependence, cigarettes, uncomplicated: Secondary | ICD-10-CM | POA: Diagnosis not present

## 2021-08-02 DIAGNOSIS — O0289 Other abnormal products of conception: Secondary | ICD-10-CM | POA: Insufficient documentation

## 2021-08-02 LAB — CBC WITH DIFFERENTIAL/PLATELET
Abs Immature Granulocytes: 0.02 10*3/uL (ref 0.00–0.07)
Basophils Absolute: 0 10*3/uL (ref 0.0–0.1)
Basophils Relative: 0 %
Eosinophils Absolute: 0.1 10*3/uL (ref 0.0–0.5)
Eosinophils Relative: 1 %
HCT: 40.1 % (ref 36.0–46.0)
Hemoglobin: 12.9 g/dL (ref 12.0–15.0)
Immature Granulocytes: 0 %
Lymphocytes Relative: 23 %
Lymphs Abs: 1.8 10*3/uL (ref 0.7–4.0)
MCH: 31.6 pg (ref 26.0–34.0)
MCHC: 32.2 g/dL (ref 30.0–36.0)
MCV: 98.3 fL (ref 80.0–100.0)
Monocytes Absolute: 0.4 10*3/uL (ref 0.1–1.0)
Monocytes Relative: 5 %
Neutro Abs: 5.5 10*3/uL (ref 1.7–7.7)
Neutrophils Relative %: 71 %
Platelets: 269 10*3/uL (ref 150–400)
RBC: 4.08 MIL/uL (ref 3.87–5.11)
RDW: 13.3 % (ref 11.5–15.5)
WBC: 7.8 10*3/uL (ref 4.0–10.5)
nRBC: 0 % (ref 0.0–0.2)

## 2021-08-02 LAB — COMPREHENSIVE METABOLIC PANEL
ALT: 11 U/L (ref 0–44)
AST: 15 U/L (ref 15–41)
Albumin: 3.1 g/dL — ABNORMAL LOW (ref 3.5–5.0)
Alkaline Phosphatase: 41 U/L (ref 38–126)
Anion gap: 9 (ref 5–15)
BUN: 8 mg/dL (ref 6–20)
CO2: 22 mmol/L (ref 22–32)
Calcium: 8.8 mg/dL — ABNORMAL LOW (ref 8.9–10.3)
Chloride: 107 mmol/L (ref 98–111)
Creatinine, Ser: 0.68 mg/dL (ref 0.44–1.00)
GFR, Estimated: >60 mL/min (ref >60)
Glucose, Bld: 152 mg/dL — ABNORMAL HIGH (ref 70–99)
Potassium: 3.7 mmol/L (ref 3.5–5.1)
Sodium: 138 mmol/L (ref 135–145)
Total Bilirubin: 0.5 mg/dL (ref 0.3–1.2)
Total Protein: 6.5 g/dL (ref 6.5–8.1)

## 2021-08-02 LAB — I-STAT BETA HCG BLOOD, ED (MC, WL, AP ONLY): I-stat hCG, quantitative: <5 m[IU]/mL (ref <5)

## 2021-08-02 MED ORDER — ONDANSETRON HCL 4 MG/2ML IJ SOLN
4.0000 mg | Freq: Once | INTRAMUSCULAR | Status: AC
  Administered 2021-08-02: 4 mg via INTRAVENOUS
  Filled 2021-08-02: qty 2

## 2021-08-02 MED ORDER — IOHEXOL 350 MG/ML SOLN
80.0000 mL | Freq: Once | INTRAVENOUS | Status: AC | PRN
  Administered 2021-08-02: 80 mL via INTRAVENOUS

## 2021-08-02 MED ORDER — LACTATED RINGERS IV BOLUS
1000.0000 mL | Freq: Once | INTRAVENOUS | Status: AC
  Administered 2021-08-02: 1000 mL via INTRAVENOUS

## 2021-08-02 MED ORDER — TETRACAINE HCL 0.5 % OP SOLN
1.0000 [drp] | Freq: Once | OPHTHALMIC | Status: AC
  Administered 2021-08-02: 1 [drp] via OPHTHALMIC
  Filled 2021-08-02: qty 4

## 2021-08-02 NOTE — ED Provider Notes (Signed)
Emergency Medicine Provider Triage Evaluation Note  Kaylee Simpson , a 46 y.o. female  was evaluated in triage.  Pt complains of headache and left eye redness.  Complains of sudden onset of "lightening bolt," headache yesterday morning associated with nausea, vomiting, and lightheadedness.  Denies previous headaches in the past.  Patient states that since then she has been having intermittent headaches when standing and exerting herself.  Patient denies any numbness, weakness, facial asymmetry, slurred speech.  Patient also complains of left eye redness and pain.  Patient states that her symptoms started yesterday morning.  Patient denies any visual disturbance or blindness.  Denies any headache.  Review of Systems  Positive: Headache Negative: Numbness, weakness, slurred speech, facial asymmetry, blindness, shortness of  Physical Exam  BP (!) 162/94   Pulse 78   Temp 98.6 F (37 C)   Resp 14   SpO2 100%  Gen:   Awake, no distress   Resp:  Normal effort  MSK:   Moves extremities without difficulty  Other:  CN II through XII intact, + strength to bilateral upper and lower extremities, negative pronator-.  Sensation to light touch intact to bilateral upper and lower EXTR.  EOM intact bilaterally.  Pupils PERRL.  No discharge noted to bilateral eyes.  Medical Decision Making  Medically screening exam initiated at 1:10 PM.  Appropriate orders placed.  Kaylee Simpson was informed that the remainder of the evaluation will be completed by another provider, this initial triage assessment does not replace that evaluation, and the importance of remaining in the ED until their evaluation is complete.  The patient appears stable so that the remainder of the work up may be completed by another provider.      Kaylee Schroeder, PA-C 08/02/21 1312    Kaylee Bucco, MD 08/03/21 [phone]

## 2021-08-02 NOTE — ED Notes (Signed)
Patient in CT during VS recheck time

## 2021-08-02 NOTE — ED Notes (Signed)
Patient transported to MRI 

## 2021-08-02 NOTE — ED Notes (Signed)
Patient is being discharged from the Urgent Care and sent to the Emergency Department via personal vehicle with family . Per Provider Chales Salmon, patient is in need of higher level of care due to needing further neuro evaluation. Patient is aware and verbalizes understanding of plan of care.   Vitals:   08/02/21 1219  BP: (!) 168/102  Pulse: 67  Resp: 18  Temp: 98.7 F (37.1 C)  SpO2: 100%

## 2021-08-02 NOTE — Discharge Instructions (Addendum)
Please follow up with your PCP. Take tylenol and ibuprofen as needed for headache. Please return to the ED if symptoms persist.

## 2021-08-02 NOTE — ED Provider Notes (Signed)
[facility] EMERGENCY DEPARTMENT Provider Note   CSN: [id number] Arrival date & time: 08/02/21  1232     History No chief complaint on file.   Kaylee Simpson is a 45 y.o. female.   Headache Pain location:  Occipital Radiates to:  Eyes Severity currently:  10/10 Severity at highest:  10/10 Duration:  2 days Associated symptoms: eye pain   Associated symptoms: no abdominal pain, no back pain, no blurred vision, no congestion, no cough, no diarrhea, no dizziness, no drainage, no ear pain, no facial pain, no fatigue, no fever, no focal weakness, no hearing loss, no loss of balance, no myalgias, no nausea, no near-syncope, no neck pain, no neck stiffness, no numbness, no paresthesias, no photophobia, no seizures, no sinus pressure, no sore throat, no visual change, no vomiting and no weakness    Patient is a 45 year old female presenting with headache and left eye pain.  Patient states that yesterday morning she woke up with a headache in the back of her head and was able to work throughout the day yesterday with his headache.  She also noted irritation and redness to her left eye yesterday.  Patient states that she has had a headache intermittently throughout the day yesterday. This morning she woke up with a headache in the posterior aspect of her head that radiated towards her left eye. She states that her headache has been constant today.  She complains of left eye pain.  Patient denies any visual changes, blurred vision or photophobia.  Patient denies any numbness, weakness, facial asymmetry, slurred speech  Patient denies any fever, chills, chest pain, shortness of breath, nausea, vomiting, diarrhea, numbness or weakness.    Past Medical History:  Diagnosis Date   Acute metabolic encephalopathy 07/15/2020   Chronic ankle pain    Complication of anesthesia    Drug overdose, accidental or unintentional, initial encounter 07/15/2020   Hypertension    not a problem    Pneumonia 07/15/2020   aspiration   Polysubstance abuse (HCC)    PONV (postoperative nausea and vomiting)    Pyelonephritis "several times"   Sepsis (HCC) 07/15/2020    Patient Active Problem List   Diagnosis Date Noted   Traumatic compartment syndrome of left lower extremity (HCC)    Pain from implanted hardware    Acute metabolic encephalopathy 07/15/2020   Sepsis (HCC) 07/15/2020   Aspiration pneumonia of both lower lobes due to gastric secretions (HCC) 07/15/2020   Polysubstance abuse (HCC) 07/15/2020   Drug overdose 07/15/2020   Drug overdose, accidental or unintentional, initial encounter 07/14/2020   Closed nondisplaced oblique fracture of shaft of fibula with routine healing 12/05/2019   Trochanteric bursitis, left hip 05/24/2018   Pain in left ankle and joints of left foot 05/24/2018   Chronic pain of left ankle 12/10/2016   Status post open reduction with internal fixation (ORIF) of fracture of ankle 12/10/2016   Status post ORIF of fracture of ankle 04/16/2016   Anemia 10/08/2012   Pyelonephritis 10/05/2012   Hypokalemia 10/05/2012   Hyponatremia 10/05/2012   Tachycardia 10/05/2012   SIRS (systemic inflammatory response syndrome) (HCC) 10/05/2012   Acute renal insufficiency 10/05/2012    Past Surgical History:  Procedure Laterality Date   DILATION AND CURETTAGE OF UTERUS  1997; 1998   FASCIOTOMY Left 04/16/2021   Procedure: LEFT ANTERIOR COMPARTMENT RELEASE;  Surgeon: Nadara Mustard, MD;  Location: [facility];  Service: Orthopedics;  Laterality: Left;   FRACTURE SURGERY  HARDWARE REMOVAL Left 04/16/2021   Procedure: REMOVAL OF HARDWARE LEFT ANKLE;  Surgeon: Nadara Mustarduda, Marcus V, MD;  Location: [facility];  Service: Orthopedics;  Laterality: Left;   ORIF ANKLE FRACTURE Left 04/16/2016   Procedure: OPEN REDUCTION INTERNAL FIXATION (ORIF) LEFT BIMALLEOLAR ANKLE FRACTURE WITH SYNDESMOSIS SCREW;  Surgeon: Eldred MangesMark C Yates, MD;  Location: [facility];   Service: Orthopedics;  Laterality: Left;   ORIF ANKLE FRACTURE BIMALLEOLAR Left 04/16/2016   ORIF syndesmosis.   PERCUTANEOUS PINNING TOE FRACTURE Right 2011   great toe and middle     OB History     Gravida  6   Para  4   Term      Preterm  4   AB  2   Living  4      SAB      IAB      Ectopic      Multiple      Live Births  4           No family history on file.  Social History   Tobacco Use   Smoking status: Every Day    Packs/day: 0.25    Years: 10.00    Pack years: 2.50    Types: Cigarettes   Smokeless tobacco: Current  Substance Use Topics   Alcohol use: Yes    Alcohol/week: 6.0 standard drinks    Types: 6 Cans of beer per week    Comment: Social   Drug use: Yes    Types: Marijuana    Home Medications Prior to Admission medications   Medication Sig Start Date End Date Taking? Authorizing Provider  acetaminophen (TYLENOL) 500 MG tablet Take 2,000 mg by mouth every 6 (six) hours as needed for mild pain.   Yes [provider]  gabapentin (NEURONTIN) 300 MG capsule Take 300 mg by mouth 3 (three) times daily as needed (pain). Takes 3 times per week 04/23/21  Yes [provider]  hydrochlorothiazide (HYDRODIURIL) 12.5 MG tablet Take 12.5 mg by mouth daily. 04/07/21  Yes [provider]  ibuprofen (ADVIL) 200 MG tablet Take 600-800 mg by mouth every 6 (six) hours as needed for headache or mild pain.   Yes [provider]  meloxicam (MOBIC) 15 MG tablet Take 1 tablet (15 mg total) by mouth daily. Patient taking differently: Take 15 mg by mouth daily as needed for pain. Takes 3 times per week 04/24/21 04/24/22 Yes Persons, [place], [place]  Norgestim-Eth Charlott HollerEstrad Triphasic (TRI-LO-SPRINTEC PO) Take 1 tablet by mouth daily.   Yes [provider]  Vitamin D, Ergocalciferol, (DRISDOL) 1.25 MG (50000 UNIT) CAPS capsule Take 50,000 Units by mouth every 7 (seven) days.   Yes [provider]  aspirin EC 325 MG  tablet Take 1 tablet (325 mg total) by mouth daily. Patient not taking: No sig reported 04/16/21   Persons, [place], PA  chlorhexidine (HIBICLENS) 4 % external liquid Apply topically daily as needed. Patient not taking: No sig reported 05/11/21   Particia NearingLane, Rachel Elizabeth, PA-C  oxyCODONE-acetaminophen (PERCOCET) 5-325 MG tablet Take 1 tablet by mouth every 4 (four) hours as needed for severe pain. Patient not taking: No sig reported 04/24/21 04/24/22  Persons, [place], PA  TRI-SPRINTEC 0.18/0.215/0.25 MG-35 MCG tablet Take 1 tablet by mouth daily. 05/06/21   [provider]    Allergies    Gabapentin  Review of Systems   Review of Systems  Constitutional:  Negative for chills, diaphoresis, fatigue and fever.  HENT:  Negative for congestion, dental problem, ear discharge, ear pain, facial swelling, hearing loss, nosebleeds, postnasal drip, rhinorrhea, sinus pressure, sinus pain, sneezing, sore throat and trouble swallowing.   Eyes:  Positive for pain. Negative for blurred vision, photophobia and visual disturbance.  Respiratory:  Negative for cough, chest tightness, shortness of breath, wheezing and stridor.   Cardiovascular:  Negative for chest pain, palpitations, leg swelling and near-syncope.  Gastrointestinal:  Negative for abdominal distention, abdominal pain, blood in stool, constipation, diarrhea, nausea and vomiting.  Endocrine: Negative for polydipsia and polyuria.  Genitourinary:  Negative for difficulty urinating, dysuria, flank pain, frequency, hematuria, urgency, vaginal bleeding and vaginal discharge.  Musculoskeletal:  Negative for arthralgias, back pain, myalgias, neck pain and neck stiffness.  Skin:  Negative for color change, rash and wound.  Allergic/Immunologic: Negative for environmental allergies and food allergies.  Neurological:  Positive for headaches. Negative for dizziness, focal weakness, seizures, syncope, facial asymmetry, speech difficulty, weakness,  light-headedness, numbness, paresthesias and loss of balance.  Psychiatric/Behavioral:  Negative for agitation, behavioral problems and confusion.   All other systems reviewed and are negative.  Physical Exam Updated Vital Signs BP (!) 190/92   Pulse (!) 56   Temp 98.7 F (37.1 C) (Oral)   Resp 18   SpO2 100%   Physical Exam Vitals and nursing note reviewed.  Constitutional:      General: She is not in acute distress.    Appearance: Normal appearance. She is normal weight. She is not ill-appearing.  HENT:     Head: Normocephalic and atraumatic.     Right Ear: External ear normal.     Left Ear: External ear normal.     Nose: Nose normal. No congestion.     Mouth/Throat:     Mouth: Mucous membranes are moist.     Pharynx: Oropharynx is clear. No oropharyngeal exudate or posterior oropharyngeal erythema.  Eyes:     General: Lids are normal. No visual field deficit.    Intraocular pressure: Right eye pressure is 12 mmHg. Left eye pressure is 12 mmHg. Measurements were taken using a handheld tonometer.    Extraocular Movements: Extraocular movements intact.     Right eye: Normal extraocular motion.     Left eye: Normal extraocular motion and no nystagmus.     Conjunctiva/sclera:     Left eye: Left conjunctiva is injected. No exudate or hemorrhage.    Pupils: Pupils are equal, round, and reactive to light.     Comments: Right eye is 20/30, left eye 20/40  Neck:     Vascular: No carotid bruit.  Cardiovascular:     Rate and Rhythm: Normal rate and regular rhythm.     Pulses: Normal pulses.     Heart sounds: Normal heart sounds. No murmur heard. Pulmonary:     Effort: Pulmonary effort is normal. No respiratory distress.     Breath sounds: Normal breath sounds. No stridor. No wheezing, rhonchi or rales.  Chest:     Chest wall: No tenderness.  Abdominal:     General: Bowel sounds are normal. There is no distension.     Palpations: Abdomen is soft.     Tenderness: There is no  abdominal tenderness. There is no right CVA tenderness, left CVA tenderness, guarding or rebound.  Musculoskeletal:        General: No swelling or tenderness. Normal range of motion.     Cervical back: Normal range of motion and neck supple. No rigidity, tenderness or bony tenderness.  Thoracic back: Normal. No tenderness or bony tenderness.     Lumbar back: Normal. No tenderness or bony tenderness.     Right lower leg: No edema.     Left lower leg: No edema.  Skin:    General: Skin is warm and dry.     Coloration: Skin is not jaundiced.  Neurological:     General: No focal deficit present.     Mental Status: She is alert and oriented to person, place, and time. Mental status is at baseline.     Cranial Nerves: Cranial nerves are intact. No cranial nerve deficit, dysarthria or facial asymmetry.     Sensory: Sensation is intact. No sensory deficit.     Motor: Motor function is intact. No weakness.     Coordination: Coordination is intact. Finger-Nose-Finger Test normal.     Gait: Gait is intact. Gait normal.  Psychiatric:        Mood and Affect: Mood normal.        Behavior: Behavior normal.        Thought Content: Thought content normal.        Judgment: Judgment normal.    ED Results / Procedures / Treatments   Labs (all labs ordered are listed, but only abnormal results are displayed) Labs Reviewed  COMPREHENSIVE METABOLIC PANEL - Abnormal; Notable for the following components:      Result Value   Glucose, Bld 152 (*)    Calcium 8.8 (*)    Albumin 3.1 (*)    All other components within normal limits  CBC WITH DIFFERENTIAL/PLATELET  I-STAT BETA HCG BLOOD, ED ([facility], [facility], [facility] ONLY)    EKG None  Radiology CT Angio Head W or Wo Contrast  Result Date: 08/02/2021 CLINICAL DATA:  Concern for subarachnoid hemorrhage; headache EXAM: CT ANGIOGRAPHY HEAD AND NECK TECHNIQUE: Multidetector CT imaging of the head and neck was performed using the standard protocol during bolus  administration of intravenous contrast. Multiplanar CT image reconstructions and MIPs were obtained to evaluate the vascular anatomy. Carotid stenosis measurements (when applicable) are obtained utilizing NASCET criteria, using the distal internal carotid diameter as the denominator. CONTRAST:  80mL OMNIPAQUE IOHEXOL 350 MG/ML SOLN COMPARISON:  None. FINDINGS: CT HEAD Brain: There is no acute intracranial hemorrhage, mass effect, or edema. Gray-white differentiation is preserved. There is no extra-axial fluid collection. Ventricles and sulci are within normal limits in size and configuration. Vascular: Better evaluated on CTA portion. Skull: Calvarium is unremarkable. Sinuses/Orbits: No acute finding. Other: None. Review of the MIP images confirms the above findings CTA NECK Aortic arch: Great vessel origins are patent. Right carotid system: Patent. No stenosis or evidence of dissection. Left carotid system: Patent.  No stenosis or evidence of dissection. Vertebral arteries: Patent.  No stenosis or dissection. Skeleton: No significant abnormality. Other neck: Unremarkable. Upper chest: Included upper lungs are clear. Review of the MIP images confirms the above findings CTA HEAD Anterior circulation: Intracranial internal carotid arteries are patent. Anterior and middle cerebral arteries are patent. Posterior circulation: Intracranial vertebral arteries are patent. Basilar artery is patent. Major cerebellar arteries are patent. Posterior cerebral arteries are patent. Venous sinuses: Patent as allowed by contrast bolus timing. Review of the MIP images confirms the above findings IMPRESSION: No acute intracranial hemorrhage. No aneurysm. No large vessel occlusion, hemodynamically significant stenosis, or evidence of dissection. Electronically Signed   By: Guadlupe Spanish M.D.   On: 08/02/2021 15:29   CT Angio Neck W and/or Wo Contrast  Result Date: 08/02/2021  CLINICAL DATA:  Concern for subarachnoid hemorrhage;  headache EXAM: CT ANGIOGRAPHY HEAD AND NECK TECHNIQUE: Multidetector CT imaging of the head and neck was performed using the standard protocol during bolus administration of intravenous contrast. Multiplanar CT image reconstructions and MIPs were obtained to evaluate the vascular anatomy. Carotid stenosis measurements (when applicable) are obtained utilizing NASCET criteria, using the distal internal carotid diameter as the denominator. CONTRAST:  80mL OMNIPAQUE IOHEXOL 350 MG/ML SOLN COMPARISON:  None. FINDINGS: CT HEAD Brain: There is no acute intracranial hemorrhage, mass effect, or edema. Gray-white differentiation is preserved. There is no extra-axial fluid collection. Ventricles and sulci are within normal limits in size and configuration. Vascular: Better evaluated on CTA portion. Skull: Calvarium is unremarkable. Sinuses/Orbits: No acute finding. Other: None. Review of the MIP images confirms the above findings CTA NECK Aortic arch: Great vessel origins are patent. Right carotid system: Patent. No stenosis or evidence of dissection. Left carotid system: Patent.  No stenosis or evidence of dissection. Vertebral arteries: Patent.  No stenosis or dissection. Skeleton: No significant abnormality. Other neck: Unremarkable. Upper chest: Included upper lungs are clear. Review of the MIP images confirms the above findings CTA HEAD Anterior circulation: Intracranial internal carotid arteries are patent. Anterior and middle cerebral arteries are patent. Posterior circulation: Intracranial vertebral arteries are patent. Basilar artery is patent. Major cerebellar arteries are patent. Posterior cerebral arteries are patent. Venous sinuses: Patent as allowed by contrast bolus timing. Review of the MIP images confirms the above findings IMPRESSION: No acute intracranial hemorrhage. No aneurysm. No large vessel occlusion, hemodynamically significant stenosis, or evidence of dissection. Electronically Signed   By: Guadlupe Spanish M.D.   On: 08/02/2021 15:29   MR BRAIN WO CONTRAST  Result Date: 08/02/2021 CLINICAL DATA:  Headache EXAM: MRI HEAD WITHOUT CONTRAST TECHNIQUE: Multiplanar, multiecho pulse sequences of the brain and surrounding structures were obtained without intravenous contrast. COMPARISON:  None. FINDINGS: Brain: No acute infarct, mass effect or extra-axial collection. Small focus of hyperintense T2-weighted signal in the right cerebellum. Normal white matter signal, parenchymal volume and CSF spaces. The midline structures are normal. Vascular: Major flow voids are preserved. Skull and upper cervical spine: Normal calvarium and skull base. Visualized upper cervical spine and soft tissues are normal. Sinuses/Orbits:No paranasal sinus fluid levels or advanced mucosal thickening. No mastoid or middle ear effusion. Normal orbits. IMPRESSION: 1. No acute intracranial abnormality. 2. Small focus of hyperintense T2-weighted signal in the right cerebellum, nonspecific. This may be a sequela of prior ischemic or inflammatory insult. Electronically Signed   By: Deatra Robinson M.D.   On: 08/02/2021 21:22    Procedures Procedures   Medications Ordered in ED Medications  iohexol (OMNIPAQUE) 350 MG/ML injection 80 mL (80 mLs Intravenous Contrast Given 08/02/21 1517)  lactated ringers bolus 1,000 mL (0 mLs Intravenous Stopped 08/02/21 2128)  ondansetron (ZOFRAN) injection 4 mg (4 mg Intravenous Given 08/02/21 1950)  tetracaine (PONTOCAINE) 0.5 % ophthalmic solution 1 drop (1 drop Both Eyes Given 08/02/21 2129)    ED Course  I have reviewed the triage vital signs and the nursing notes.  Pertinent labs & imaging results that were available during my care of the patient were reviewed by me and considered in my medical decision making (see chart for details).    MDM Rules/Calculators/A&P                         {Nazifa Sofia is a 45 y.o. female with presenting with headache  and left eye pain.  Patient is hemodynamically  stable and in no acute distress.  Patient labs are unremarkable.  Patient CTA of head and neck showed no acute intracranial abnormality. Her eye pressures are within normal limits. Her visual acuity is intact. She states that her headache is improving and she feels better. She states that she would like to go home. She was given IV fluids and zofran.   MRI of the brain showed no acute intracranial abnormality.   Patient is feeling better and is able to ambulate without complications.   She will followed up with her PCP.  Patient states compliance and understanding of the plan. I explained labs and imaging to the patient. No further questions at this time from the patient.  The patient is safe and stable for discharge at this time with return precautions provided and a plan for follow-up care in place as needed  The plan for this patient was discussed with Dr. Rosalia Hammers, who voiced agreement and who oversaw evaluation and treatment of this patient.   Final Clinical Impression(s) / ED Diagnoses Final diagnoses:  Nonintractable headache, unspecified chronicity pattern, unspecified headache type    Rx / DC Orders ED Discharge Orders     None        Lottie Dawson, MD 08/03/21 [id number]    Margarita Grizzle, MD 08/06/21 1557

## 2021-08-02 NOTE — ED Triage Notes (Signed)
Pt presents with ongoing severe headache and nausea X 2 days; pt presents with bilateral eye pain & redness since waking up this morning.

## 2021-08-02 NOTE — ED Triage Notes (Signed)
Pt here with sudden onset of a headache yesterday with nausea and emesis. Pt also complains of eye redness and pain since.

## 2021-08-02 NOTE — ED Provider Notes (Signed)
Patient reports "lighting bolt" headache with nausea and felt like she was going to pass out.  Denies any similar headaches in the past.  Recommended going to the ED for further evaluation and possible CT scan.  Patient verbalized understanding and will be transported via private vehicle with family member.    Ivette Loyal, NP 08/02/21 [phone]

## 2021-08-04 ENCOUNTER — Other Ambulatory Visit: Payer: Self-pay | Admitting: Physician Assistant

## 2021-08-05 ENCOUNTER — Telehealth: Payer: Self-pay

## 2021-08-05 NOTE — Telephone Encounter (Signed)
Transition Care Management Follow-up Telephone Call Date of discharge and from where: 08/02/2021-Kaylee Simpson  How have you been since you were released from the hospital? Patient stated she feels a lot better  Any questions or concerns? No  Items Reviewed: Did the pt receive and understand the discharge instructions provided? Yes  Medications obtained and verified?  No medications given at discharge  Other? No  Any new allergies since your discharge? No  Dietary orders reviewed? N/A Do you have support at home? No   Home Care and Equipment/Supplies: Were home health services ordered? not applicable If so, what is the name of the agency? N/A  Has the agency set up a time to come to the patient's home? not applicable Were any new equipment or medical supplies ordered?  No What is the name of the medical supply agency? N/A Were you able to get the supplies/equipment? not applicable Do you have any questions related to the use of the equipment or supplies? No  Functional Questionnaire: (I = Independent and D = Dependent) ADLs: I  Bathing/Dressing- I  Meal Prep- I  Eating- I  Maintaining continence- I  Transferring/Ambulation- I  Managing Meds- I  Follow up appointments reviewed:  PCP Hospital f/u appt confirmed? Yes  Scheduled to see Dr. Concepcion Elk on 08/06/2021 @ 3:00 pm. Specialist Hospital f/u appt confirmed? No   Are transportation arrangements needed? No  If their condition worsens, is the pt aware to call PCP or go to the Emergency Dept.? Yes Was the patient provided with contact information for the PCP's office or ED? Yes Was to pt encouraged to call back with questions or concerns? Yes

## 2021-08-16 ENCOUNTER — Other Ambulatory Visit: Payer: Self-pay | Admitting: Internal Medicine

## 2021-08-16 DIAGNOSIS — Z1239 Encounter for other screening for malignant neoplasm of breast: Secondary | ICD-10-CM | POA: Diagnosis not present

## 2021-08-16 DIAGNOSIS — Z13220 Encounter for screening for lipoid disorders: Secondary | ICD-10-CM | POA: Diagnosis not present

## 2021-08-16 DIAGNOSIS — I10 Essential (primary) hypertension: Secondary | ICD-10-CM | POA: Diagnosis not present

## 2021-08-16 DIAGNOSIS — Z0000 Encounter for general adult medical examination without abnormal findings: Secondary | ICD-10-CM | POA: Diagnosis not present

## 2021-08-16 DIAGNOSIS — Z131 Encounter for screening for diabetes mellitus: Secondary | ICD-10-CM | POA: Diagnosis not present

## 2021-08-16 DIAGNOSIS — Z309 Encounter for contraceptive management, unspecified: Secondary | ICD-10-CM | POA: Diagnosis not present

## 2021-08-17 LAB — TSH: TSH: 1.19 mIU/L

## 2021-08-17 LAB — VITAMIN D 25 HYDROXY (VIT D DEFICIENCY, FRACTURES): Vit D, 25-Hydroxy: 31 ng/mL (ref 30–100)

## 2021-08-17 LAB — LIPID PANEL
Cholesterol: 195 mg/dL (ref <200)
HDL: 73 mg/dL (ref >=50)
LDL Cholesterol (Calc): 96 mg/dL (calc)
Non-HDL Cholesterol (Calc): 122 mg/dL (calc) (ref <130)
Total CHOL/HDL Ratio: 2.7 (calc) (ref <5.0)
Triglycerides: 162 mg/dL — ABNORMAL HIGH (ref <150)

## 2021-08-17 LAB — BASIC METABOLIC PANEL WITH GFR
BUN: 12 mg/dL (ref 7–25)
CO2: 25 mmol/L (ref 20–32)
Calcium: 9.2 mg/dL (ref 8.6–10.2)
Chloride: 105 mmol/L (ref 98–110)
Creat: 0.58 mg/dL (ref 0.50–0.99)
Glucose, Bld: 90 mg/dL (ref 65–99)
Potassium: 4 mmol/L (ref 3.5–5.3)
Sodium: 138 mmol/L (ref 135–146)
eGFR: 114 mL/min/{1.73_m2} (ref >=60)

## 2021-08-17 LAB — EXTRA LAV TOP TUBE

## 2021-08-19 ENCOUNTER — Other Ambulatory Visit: Payer: Self-pay | Admitting: Obstetrics and Gynecology

## 2021-08-19 ENCOUNTER — Other Ambulatory Visit: Payer: Self-pay

## 2021-08-19 NOTE — Patient Outreach (Signed)
Medicaid Managed Care   Nurse Care Manager Note  08/19/2021 Name:  Kaylee Simpson MRN:  [id number] DOB:  April 13, 1976  Kaylee Simpson is an 45 y.o. year old female who is a primary patient of Fleet Contras, MD.  The [facility] Managed Care Coordination team was consulted for assistance with:   healthcare management needs.    [name] was given information about Medicaid Managed Care Coordination team services today. [name] Patient agreed to services and verbal consent obtained.  Engaged with patient by telephone for initial visit in response to provider referral for case management and/or care coordination services.   Assessments/Interventions:  Review of past medical history, allergies, medications, health status, including review of consultants reports, laboratory and other test data, was performed as part of comprehensive evaluation and provision of chronic care management services.  SDOH (Social Determinants of Health) assessments and interventions performed: SDOH Interventions    Flowsheet Row Most Recent Value  SDOH Interventions   Food Insecurity Interventions Food Market [facility]), Other (Comment)  [referral made]  Financial Strain Interventions Intervention Not Indicated  Intimate Partner Violence Interventions Intervention Not Indicated  Transportation Interventions Other (Comment)  [patient has been given insurance plan information.]       Care Plan  Allergies  Allergen Reactions   Gabapentin Other (See Comments)    Mucscle twitching     Medications Reviewed Today     Reviewed by Vonna Drafts, CPhT (Pharmacy Technician) on 08/02/21 at 1804  Med List Status: Complete   Medication Order Taking? Sig Documenting Provider Last Dose Status Informant  acetaminophen (TYLENOL) 500 MG tablet [id number] Yes Take 2,000 mg by mouth every 6 (six) hours as needed for mild pain. [provider] Past Week Active Self  aspirin EC 325 MG tablet [id number]  No Take 1 tablet (325 mg total) by mouth daily.  Patient not taking: No sig reported   Persons, [place], [place] Not Taking Active Self  chlorhexidine (HIBICLENS) 4 % external liquid [id number] No Apply topically daily as needed.  Patient not taking: No sig reported   Particia Nearing, [place] Not Taking Active Self           Med Note [name], RACHEL J   Fri Aug 02, 2021  6:02 PM) Never picked up  gabapentin (NEURONTIN) 300 MG capsule [id number] Yes Take 300 mg by mouth 3 (three) times daily as needed (pain). Takes 3 times per week [provider] Past Week Active Self  hydrochlorothiazide (HYDRODIURIL) 12.5 MG tablet [id number] Yes Take 12.5 mg by mouth daily. [provider] 08/02/2021 Active Self  ibuprofen (ADVIL) 200 MG tablet [id number] Yes Take 600-800 mg by mouth every 6 (six) hours as needed for headache or mild pain. [provider] Past Week Active Self  meloxicam (MOBIC) 15 MG tablet [id number] Yes Take 1 tablet (15 mg total) by mouth daily.  Patient taking differently: Take 15 mg by mouth daily as needed for pain. Takes 3 times per week   Persons, [place], [place] Past Week Active Self  Norgestim-Eth Estrad Triphasic (TRI-LO-SPRINTEC PO) [id number] Yes Take 1 tablet by mouth daily. [provider] 08/02/2021 Active Self  oxyCODONE-acetaminophen (PERCOCET) 5-325 MG tablet [id number] No Take 1 tablet by mouth every 4 (four) hours as needed for severe pain.  Patient not taking: No sig reported   Persons, [place], [place] Not Taking Active Self  TRI-SPRINTEC 0.18/0.215/0.25 MG-35 MCG tablet [id number]  Take 1 tablet by mouth daily. [provider]  Active Self  Vitamin D, Ergocalciferol, (DRISDOL) 1.25 MG (50000 UNIT) CAPS capsule [id number] Yes Take 50,000 Units by mouth every 7 (seven) days. [provider] Past Month Active Self           Med Note [name], [name]   Fri Aug 02, 2021  6:04 PM) Out of refills            Patient Active Problem  List   Diagnosis Date Noted   Traumatic compartment syndrome of left lower extremity (HCC)    Pain from implanted hardware    Acute metabolic encephalopathy 07/15/2020   Sepsis (HCC) 07/15/2020   Aspiration pneumonia of both lower lobes due to gastric secretions (HCC) 07/15/2020   Polysubstance abuse (HCC) 07/15/2020   Drug overdose 07/15/2020   Drug overdose, accidental or unintentional, initial encounter 07/14/2020   Closed nondisplaced oblique fracture of shaft of fibula with routine healing 12/05/2019   Trochanteric bursitis, left hip 05/24/2018   Pain in left ankle and joints of left foot 05/24/2018   Chronic pain of left ankle 12/10/2016   Status post open reduction with internal fixation (ORIF) of fracture of ankle 12/10/2016   Status post ORIF of fracture of ankle 04/16/2016   Anemia 10/08/2012   Pyelonephritis 10/05/2012   Hypokalemia 10/05/2012   Hyponatremia 10/05/2012   Tachycardia 10/05/2012   SIRS (systemic inflammatory response syndrome) (HCC) 10/05/2012   Acute renal insufficiency 10/05/2012    Conditions to be addressed/monitored per PCP order:   healthcare management needs, anxiety, depression, food insecurity, housing resources.  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  Transportation barriers  RNCM Clinical Goal(s):  Patient  will verbalize understanding of plan for management of Anxiety and Depression take all medications exactly as prescribed and will call provider for medication related questions attend all scheduled medical appointments: work with pharmacist to address medications. work with Child psychotherapist to address the management of Anxiety and Depression through collaboration with Medical illustrator, provider, and care team.   Interventions: Inter-disciplinary care team collaboration (see longitudinal plan of care) Evaluation of current treatment plan related to  self management and patient's adherence to plan as established by provider  Patient Goals/Self-Care Activities: Patient will self administer medications as prescribed Patient will attend all scheduled provider appointments Patient will call pharmacy for medication refills Patient will continue to perform ADL's independently Patient will continue to perform IADL's independently Patient will call provider office for new concerns or questions Patient will work with BSW to address care coordination needs and will continue to work with the clinical team to address health care and disease management related needs.     Evidence-based guidance:   Review biopsychosocial determinants of health screens.  Review need for preventive screening based on age, sex, family history and health history.  Determine level of modifiable health risk.  Discuss identified risks.  Identify areas where  behavior change may lead to improved health.  Promote healthy lifestyle.  Evoke change talk using open-ended questions, pros and cons, as well as looking forward.  Identify and manage conditions or preconditions to reduce health risk.  Implement additional goals and interventions based on identified risk factors.     Follow Up:  Patient agrees to Care Plan and Follow-up.  Plan: The Managed Medicaid care management team will reach out to the patient again over the next 30  days. and The patient has been provided with contact information for the Managed Medicaid care management team and has been advised to call with any health related questions or concerns.  Date/time of next scheduled RN care management/care coordination outreach:  09/18/21 at 1230.

## 2021-08-19 NOTE — Patient Instructions (Signed)
Visit Information Hi Kaylee Simpson, thank you for speaking with me today. Kaylee Simpson was given information about Medicaid Managed Care team care coordination services as a part of their [facility] Medicaid benefit. Kaylee Simpson verbally consented to engagement with the [facility] Managed Care team.   If you are experiencing a medical emergency, please call 911 or report to your local emergency department or urgent care.   If you have a non-emergency medical problem during routine business hours, please contact your provider's office and ask to speak with a nurse.   For questions related to your [facility] health plan, please call: [phone] or go here:[email or web address]  If you would like to schedule transportation through your [facility] plan, please call the following number at least 2 days in advance of your appointment: [phone].  Call the [facility] Crisis Line at [phone], at any time, 24 hours a day, 7 days a week. If you are in danger or need immediate medical attention call 911.  If you would like help to quit smoking, call 1-800-QUIT-NOW (605-686-6903) OR Espaol: 1-855-Djelo-Ya (2-951-884-1660) o para ms informacin haga clic aqu or Text READY to 630-160 to register via text  Kaylee Simpson - following are the goals we discussed in your visit today:   Goals Addressed             This Visit's Progress    Protect My Health       Timeframe:  Long-Range Goal Priority:  High Start Date:      08/19/21                       Expected End Date:    ongoing                   Follow Up Date 09/19/21    - practice safe sex - schedule appointment for flu shot - schedule appointment for vaccines needed due to my age or health - schedule recommended health tests (blood work, mammogram, colonoscopy, pap test) - schedule and keep appointment for annual check-up    Why is this important?   Screening tests can find diseases early when they are easier to  treat.  Your doctor or nurse will talk with you about which tests are important for you.  Getting shots for common diseases like the flu and shingles will help prevent them.     Notes:         The patient verbalized understanding of instructions provided today and declined a print copy of patient instruction materials.   The Managed Medicaid care management team will reach out to the patient again over the next 30 days.  The  Patient                                              has been provided with contact information for the Managed Medicaid care management team and has been advised to call with any health related questions or concerns.   Kathi Der RN, BSN [place]  [facility] [facility], [employer] - Managed Medicaid High Risk [phone].    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  Transportation barriers  RNCM Clinical Goal(s):  Patient will verbalize understanding of plan for management of Anxiety and Depression take all medications exactly as prescribed and will call provider for medication related questions attend all scheduled medical appointments: work with pharmacist to address medications. work with Child psychotherapist to address the management of Anxiety and Depression through collaboration with Medical illustrator,  provider, and care team.   Interventions: Inter-disciplinary care team collaboration (see longitudinal plan of care) Evaluation of current treatment plan related to  self management and patient's adherence to plan as established by provider   Patient Goals/Self-Care Activities: Patient will self administer medications as prescribed Patient will attend all scheduled provider appointments Patient will call pharmacy for medication refills Patient will continue to perform ADL's independently Patient will continue to perform IADL's independently Patient will call provider office for new concerns or questions Patient will work with BSW to address care coordination needs and will continue to work with the clinical team to address health care and disease management related needs.     Evidence-based guidance:   Review biopsychosocial determinants of health screens.  Review need for preventive screening based on age, sex, family history and health history.  Determine level of modifiable health risk.  Discuss identified risks.  Identify areas where behavior change may lead to improved health.  Promote healthy lifestyle.  Evoke change talk using open-ended questions, pros and cons, as well as looking forward.  Identify and manage conditions or preconditions to reduce health risk.  Implement additional goals and interventions based on identified risk factors.

## 2021-08-20 ENCOUNTER — Telehealth: Payer: Self-pay | Admitting: Internal Medicine

## 2021-08-20 NOTE — Telephone Encounter (Signed)
..   Medicaid Managed Care   Unsuccessful Outreach Note  08/20/2021 Name: Kaylee Simpson MRN: [id number] DOB: 08-21-1976  Referred by: Fleet Contras, MD Reason for referral : High Risk Managed Medicaid and Appointment (I tried to reach the patient today to get her scheduled for phone visits with the MM LCSW and the MM Pharmacist. She did not answer and her VM was not set up.)   An unsuccessful telephone outreach was attempted today. The patient was referred to the case management team for assistance with care management and care coordination.   Follow Up Plan: The care management team will reach out to the patient again over the next 7 days.   Weston Settle Care Guide, High Risk Medicaid Managed Care Embedded Care Coordination [facility]  [facility]

## 2021-08-22 ENCOUNTER — Other Ambulatory Visit: Payer: Self-pay

## 2021-08-22 NOTE — Patient Instructions (Signed)
Visit Information  Ms. Lorisa Spinola  - as a part of your Medicaid benefit, you are eligible for care management and care coordination services at no cost or copay. I was unable to reach you by phone today but would be happy to help you with your health related needs. Please feel free to call me @ [phone].   A member of the Managed Medicaid care management team will reach out to you again over the next 7 days.  Eleanore Junio, BSW, MHA [facility]  [place]  High Risk Managed Medicaid Team  [phone]  

## 2021-08-22 NOTE — Patient Outreach (Signed)
Care Coordination  08/22/2021  Kaylee Simpson [birth year] [id number]   Medicaid Managed Care   Unsuccessful Outreach Note  08/22/2021 Name: Kaylee Simpson MRN: [id number] DOB: 12-May-1976  Referred by: Fleet Contras, MD Reason for referral : High Risk Managed Medicaid (MM Social Work Unsuccessful [employer])   An unsuccessful telephone outreach was attempted today. The patient was referred to the case management team for assistance with care management and care coordination.   Follow Up Plan: The care management team will reach out to the patient again over the next 7 days.   Gus Puma, BSW, [place] [facility]  [employer] Risk Managed Medicaid Team  [phone]

## 2021-08-23 ENCOUNTER — Telehealth: Payer: Self-pay | Admitting: Internal Medicine

## 2021-08-23 NOTE — Telephone Encounter (Signed)
..   Medicaid Managed Care   Unsuccessful Outreach Note  08/23/2021 Name: Kaylee Simpson MRN: [id number] DOB: 01-14-1976  Referred by: Fleet Contras, MD Reason for referral : High Risk Managed Medicaid and Appointment (I tried to reach this patient today to get her scheduled for phone visits with the [facility] BSW, LCSW and the Pharmacist. She did not answer and the her VM was not set up.)   A second unsuccessful telephone outreach was attempted today. The patient was referred to the case management team for assistance with care management and care coordination.   Follow Up Plan: The care management team will reach out to the patient again over the next 7 days.   Weston Settle Care Guide, High Risk Medicaid Managed Care Embedded Care Coordination [facility]  [facility]

## 2021-08-28 ENCOUNTER — Other Ambulatory Visit: Payer: Self-pay

## 2021-08-28 NOTE — Patient Instructions (Signed)
Visit Information  Ms. Klingberg was given information about Medicaid Managed Care team care coordination services as a part of their [facility] Medicaid benefit. Khristina Hayes verbally consented to engagement with the [facility] Managed Care team.   If you are experiencing a medical emergency, please call 911 or report to your local emergency department or urgent care.   If you have a non-emergency medical problem during routine business hours, please contact your provider's office and ask to speak with a nurse.   For questions related to your [facility] health plan, please call: [phone] or go here:[email or web address]  If you would like to schedule transportation through your [facility] plan, please call the following number at least 2 days in advance of your appointment: [phone].  Call the [facility] Crisis Line at [phone], at any time, 24 hours a day, 7 days a week. If you are in danger or need immediate medical attention call 911.  If you would like help to quit smoking, call 1-800-QUIT-NOW ((239) 195-3248) OR Espaol: 1-855-Djelo-Ya (3-810-175-1025) o para ms informacin haga clic aqu or Text READY to 852-778 to register via text  Ms. Credit - following are the goals we discussed in your visit today:   Goals Addressed   None     Please see education materials related to HTN provided as print materials.   The patient verbalized understanding of instructions provided today and declined a print copy of patient instruction materials.   The  Patient                                              has been provided with contact information for the Managed Medicaid care management team and has been advised to call with any health related questions or concerns.   Artelia Laroche, Pharm.D., Managed Medicaid Pharmacist [phone]   Following is a copy of your plan of care:  Patient Care Plan: Behavioral Health     Problem Identified: Behavioral  health   Priority: High  Onset Date: 08/19/2021     Long-Range Goal: Self-Management Plan Developed   Start Date: 08/19/2021  Expected End Date: 11/19/2021  This Visit's Progress: Not on track  Priority: High  Note:   Current Barriers:  Knowledge Deficits related to plan of care for management of anxiety, depression  Care Coordination needs related to Financial constraints related to food, Transportation, Limited access to food, Housing barriers, Mental Health Concerns , and 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  Transportation barriers  RNCM Clinical Goal(s):  Patient will verbalize understanding of plan for management of Anxiety and Depression take all medications exactly as prescribed and will call provider for medication related questions attend all scheduled medical appointments: work with pharmacist to address medications. work with Child psychotherapist to address the management of Anxiety and Depression through collaboration with Medical illustrator, provider, and care team.   Interventions: Inter-disciplinary care team collaboration (see longitudinal plan of care) Evaluation of current treatment plan related to  self management and patient's adherence to plan as established by provider    Patient Goals/Self-Care Activities: Patient will self administer medications as prescribed Patient will attend all scheduled provider appointments Patient will call pharmacy for medication refills Patient will continue to perform ADL's independently Patient will continue to perform IADL's independently Patient will call provider office for new concerns or questions Patient will  work with BSW to address care coordination needs and will continue to work with the clinical team to address health care and disease management related needs.      Evidence-based guidance:   Review biopsychosocial determinants of health screens.  Review need for preventive  screening based on age, sex, family history and health history.  Determine level of modifiable health risk.  Discuss identified risks.  Identify areas where behavior change may lead to improved health.  Promote healthy lifestyle.  Evoke change talk using open-ended questions, pros and cons, as well as looking forward.  Identify and manage conditions or preconditions to reduce health risk.  Implement additional goals and interventions based on identified risk factors.      Task: Mutually Develop and Malen Gauze Achievement of Patient Goals   Note:   Care Management Activities:    - verbalization of feelings encouraged    Notes:     Patient Care Plan: Medication Management     Problem Identified: Health Promotion or Disease Self-Management (General Plan of Care)      Goal: Self-Management Plan Developed   Note:   Current Barriers:  Does not maintain contact with provider office Does not contact provider office for questions/concerns   Pharmacist Clinical Goal(s):  Over the next 180 days, patient will achieve adherence to monitoring guidelines and medication adherence to achieve therapeutic efficacy contact provider office for questions/concerns as evidenced notation of same in electronic health record through collaboration with PharmD and provider.    Interventions: Inter-disciplinary care team collaboration (see longitudinal plan of care) Comprehensive medication review performed; medication list updated in electronic medical record    Patient Goals/Self-Care Activities Over the next 180 days, patient will:  - collaborate with provider on medication access solutions  Follow Up Plan: The patient has been provided with contact information for the care management team and has been advised to call with any health related questions or concerns.      Task: Mutually Develop and Malen Gauze Achievement of Patient Goals   Note:   Care Management Activities:    - verbalization of  feelings encouraged    Notes:

## 2021-08-28 NOTE — Patient Outreach (Signed)
Medicaid Managed Care    Pharmacy Note  08/28/2021 Name: Kaylee Simpson MRN: [id number] DOB: June 17, 1976  Kaylee Simpson is a 45 y.o. year old female who is a primary care patient of Fleet Contras, MD. The [facility] Managed Care Coordination team was consulted for assistance with disease management and care coordination needs.    Engaged with patient Engaged with patient by telephone for initial visit in response to referral for case management and/or care coordination services.  Ms. Gangl was given information about Managed Medicaid Care Coordination team services today. Kaylee Simpson agreed to services and verbal consent obtained.   Objective:  Lab Results  Component Value Date   CREATININE 0.58 08/16/2021   CREATININE 0.68 08/02/2021   CREATININE 0.68 04/15/2021    No results found for: HGBA1C     Component Value Date/Time   CHOL 195 08/16/2021 1515   TRIG 162 (H) 08/16/2021 1515   HDL 73 08/16/2021 1515   CHOLHDL 2.7 08/16/2021 1515   LDLCALC 96 08/16/2021 1515    Other: (TSH, CBC, Vit D, etc.)  Clinical ASCVD: Yes  The 10-year ASCVD risk score Denman George DC Jr., et al., 2013) is: 16.3%   Values used to calculate the score:     Age: 45 years     Sex: Female     Is Non-Hispanic African American: Yes     Diabetic: No     Tobacco smoker: Yes     Systolic Blood Pressure: 190 mmHg     Is BP treated: Yes     HDL Cholesterol: 73 mg/dL     Total Cholesterol: 195 mg/dL    Other: ([id number] if Afib, PHQ9 if depression, MMRC or CAT for COPD, ACT, DEXA)  BP Readings from Last 3 Encounters:  08/02/21 (!) 190/92  08/02/21 (!) 168/102  05/11/21 (!) 171/104    Assessment/Interventions: Review of patient past medical history, allergies, medications, health status, including review of consultants reports, laboratory and other test data, was performed as part of comprehensive evaluation and provision of chronic care management services.   Pain Pain Scale: August 2022: Didn't  answer -Broke ankle twice (states both kinds of pain) -Gabapentin 300mg  TID -ASA 325mg  -IBU 600mg  PRN TID -Acetaminophen 500mg  -Meloxicam 15mg  -Oxy/APAP 5/325 Plan: Stop taking NSAID's together, went into exhaustive detail on why not to combine  HTN Losartan/HCTZ 50/12.5mg  -Extensive time spent on counseling patient on blood pressure goal and impact of each antihypertensive medication on their blood pressure and risk reduction for CV disease.  Used analogies to explain the need for multiple antihypertensive medications to achieve BP goals and that it is often a silent disease with no symptoms.  Plan: Patient not at goal, main priority today was explaining meds  [facility] Plan: At goal,  patient stable/ symptoms controlled   SDOH (Social Determinants of Health) assessments and interventions performed:    Care Plan  Allergies  Allergen Reactions   Gabapentin Other (See Comments)    Mucscle twitching     Medications Reviewed Today     Reviewed by , CPhT (Pharmacy Technician) on 08/02/21 at 1804  Med List Status: Complete   Medication Order Taking? Sig Documenting Provider Last Dose Status Informant  acetaminophen (TYLENOL) 500 MG tablet Yes Take 2,000 mg by mouth every 6 (six) hours as needed for mild pain. [provider] Past Week Active Self  aspirin EC 325 MG tablet No Take 1 tablet (325 mg total) by mouth daily.  Patient not taking: No sig  reported   Persons, [place], [place] Not Taking Active Self  chlorhexidine (HIBICLENS) 4 % external liquid [id number] No Apply topically daily as needed.  Patient not taking: No sig reported   Particia Nearing, [place] Not Taking Active Self           Med Note Kaylee Simpson, RACHEL J   Fri Aug 02, 2021  6:02 PM) Never picked up  gabapentin (NEURONTIN) 300 MG capsule [id number] Yes Take 300 mg by mouth 3 (three) times daily as needed (pain). Takes 3 times per week [provider] Past  Week Active Self  hydrochlorothiazide (HYDRODIURIL) 12.5 MG tablet [id number] Yes Take 12.5 mg by mouth daily. [provider] 08/02/2021 Active Self  ibuprofen (ADVIL) 200 MG tablet [id number] Yes Take 600-800 mg by mouth every 6 (six) hours as needed for headache or mild pain. [provider] Past Week Active Self  meloxicam (MOBIC) 15 MG tablet [id number] Yes Take 1 tablet (15 mg total) by mouth daily.  Patient taking differently: Take 15 mg by mouth daily as needed for pain. Takes 3 times per week   Persons, [place], [place] Past Week Active Self  Norgestim-Eth Estrad Triphasic (TRI-LO-SPRINTEC PO) [id number] Yes Take 1 tablet by mouth daily. [provider] 08/02/2021 Active Self  oxyCODONE-acetaminophen (PERCOCET) 5-325 MG tablet [id number] No Take 1 tablet by mouth every 4 (four) hours as needed for severe pain.  Patient not taking: No sig reported   Persons, [place], [place] Not Taking Active Self  TRI-SPRINTEC 0.18/0.215/0.25 MG-35 MCG tablet [id number]  Take 1 tablet by mouth daily. [provider]  Active Self  Vitamin D, Ergocalciferol, (DRISDOL) 1.25 MG (50000 UNIT) CAPS capsule [id number] Yes Take 50,000 Units by mouth every 7 (seven) days. [provider] Past Month Active Self           Med Note Kaylee Simpson, [name]   Fri Aug 02, 2021  6:04 PM) Out of refills            Patient Active Problem List   Diagnosis Date Noted   Traumatic compartment syndrome of left lower extremity (HCC)    Pain from implanted hardware    Acute metabolic encephalopathy 07/15/2020   Sepsis (HCC) 07/15/2020   Aspiration pneumonia of both lower lobes due to gastric secretions (HCC) 07/15/2020   Polysubstance abuse (HCC) 07/15/2020   Drug overdose 07/15/2020   Drug overdose, accidental or unintentional, initial encounter 07/14/2020   Closed nondisplaced oblique fracture of shaft of fibula with routine healing 12/05/2019   Trochanteric bursitis, left hip 05/24/2018   Pain  in left ankle and joints of left foot 05/24/2018   Chronic pain of left ankle 12/10/2016   Status post open reduction with internal fixation (ORIF) of fracture of ankle 12/10/2016   Status post ORIF of fracture of ankle 04/16/2016   Anemia 10/08/2012   Pyelonephritis 10/05/2012   Hypokalemia 10/05/2012   Hyponatremia 10/05/2012   Tachycardia 10/05/2012   SIRS (systemic inflammatory response syndrome) (HCC) 10/05/2012   Acute renal insufficiency 10/05/2012    Conditions to be addressed/monitored: HTN  Care Plan : Medication Management  Updates made by Zettie Pho, RPH since 08/28/2021 12:00 AM     Problem: Health Promotion or Disease Self-Management (General Plan of Care)      Goal: Self-Management Plan Developed   Note:   Current Barriers:  Does not maintain contact with provider office Does not contact provider office for questions/concerns   Pharmacist Clinical Goal(s):  Over the next  180 days, patient will achieve adherence to monitoring guidelines and medication adherence to achieve therapeutic efficacy contact provider office for questions/concerns as evidenced notation of same in electronic health record through collaboration with PharmD and provider.    Interventions: Inter-disciplinary care team collaboration (see longitudinal plan of care) Comprehensive medication review performed; medication list updated in electronic medical record    Patient Goals/Self-Care Activities Over the next 180 days, patient will:  - collaborate with provider on medication access solutions  Follow Up Plan: The patient has been provided with contact information for the care management team and has been advised to call with any health related questions or concerns.      Task: Mutually Develop and Malen Gauze Achievement of Patient Goals   Note:   Care Management Activities:    - verbalization of feelings encouraged    Notes:        Medication Assistance:  Would like  medications delivered Plan: Verbal consent obtained for [facility] enhanced pharmacy services (medication synchronization, adherence packaging, delivery coordination). A medication sync plan was created to allow patient to get all medications delivered once every 30 to 90 days per patient preference. Patient understands they have freedom to choose pharmacy and clinical pharmacist will coordinate care between all prescribers and [facility].  Medication Name                        (please note if Rx is PRN) Prescriber                                                                  (list Provider Name & Phone Number)                                  Timing    Refill Timing Last Fill Date & DS       (if last fill/DS unavailable, list pt.'s quantity on hand) Anticipated next due date    BB B L EM BT     Ergocalciferol 1/week Fleet Contras [phone]      Cycle Fill 08/08/21 for 28 days 09/05/21  Gabapentin 300mg  TID PRN Fleet Contras      PRN 06/28/21 for 90 days   Losartan/HCTZ 50/12.5 08/29/21 Fleet Contras      Cycle Fill 08/16/21 for 30 days 09/13/21  TRI-SPRINTEC TABLETS 28S 09/15/21 [phone]      Cycle Fill 08/30/2021 for 28 days 09/27/21    Follow up: Agree/   Plan: The patient has been provided with contact information for the care management team and has been advised to call with any health related questions or concerns.   09/29/21, Pharm.D., Managed Medicaid Pharmacist - [phone]

## 2021-08-29 ENCOUNTER — Other Ambulatory Visit: Payer: Self-pay | Admitting: Licensed Clinical Social Worker

## 2021-08-29 DIAGNOSIS — F32A Depression, unspecified: Secondary | ICD-10-CM

## 2021-08-29 DIAGNOSIS — Z419 Encounter for procedure for purposes other than remedying health state, unspecified: Secondary | ICD-10-CM | POA: Diagnosis not present

## 2021-08-29 NOTE — Patient Instructions (Signed)
Visit Information  Ms. Kistner was given information about Medicaid Managed Care team care coordination services as a part of their [facility] Medicaid benefit. Ardine Bechard verbally consented to engagement with the [facility] Managed Care team.   If you are experiencing a medical emergency, please call 911 or report to your local emergency department or urgent care.   If you have a non-emergency medical problem during routine business hours, please contact your provider's office and ask to speak with a nurse.   For questions related to your [facility] health plan, please call: [phone] or go here:[email or web address]  If you would like to schedule transportation through your [facility] plan, please call the following number at least 2 days in advance of your appointment: [phone].  Call the [facility] Crisis Line at [phone], at any time, 24 hours a day, 7 days a week. If you are in danger or need immediate medical attention call 911.  If you would like help to quit smoking, call 1-800-QUIT-NOW (617-021-2572) OR Espaol: 1-855-Djelo-Ya (5-427-062-3762) o para ms informacin haga clic aqu or Text READY to 831-517 to register via text  Ms. Tumminello - following are the goals we discussed in your visit today:   Goals Addressed             This Visit's Progress    Begin and Stick with Counseling-Depression       Timeframe:  Long-Range Goal Priority:  High Start Date:  08/29/21                           Expected End Date: ongoing  Follow Up Date 09/13/21   - check out counseling - keep 90 percent of counseling appointments - schedule counseling appointment    Why is this important?   Beating depression may take some time.  If you don't feel better right away, don't give up on your treatment plan.    Notes:        Dickie La, BSW, MSW, [employer] Managed Medicaid LCSW [facility]  [facility] [email or web address] Phone:  [phone]

## 2021-09-13 ENCOUNTER — Other Ambulatory Visit: Payer: Self-pay | Admitting: Licensed Clinical Social Worker

## 2021-09-13 NOTE — Patient Instructions (Addendum)
Visit Information  Ms. Mauceri was given information about Medicaid Managed Care team care coordination services as a part of their [facility] Medicaid benefit. Vestal Goodnow verbally consented to engagement with the [facility] Managed Care team.   If you are experiencing a medical emergency, please call 911 or report to your local emergency department or urgent care.   If you have a non-emergency medical problem during routine business hours, please contact your provider's office and ask to speak with a nurse.   For questions related to your [facility] health plan, please call: [phone] or go here:[email or web address]  If you would like to schedule transportation through your [facility] plan, please call the following number at least 2 days in advance of your appointment: [phone].  Call the [facility] Crisis Line at [phone], at any time, 24 hours a day, 7 days a week. If you are in danger or need immediate medical attention call 911.  If you would like help to quit smoking, call 1-800-QUIT-NOW (504-880-4838) OR Espaol: 1-855-Djelo-Ya (4-166-063-0160) o para ms informacin haga clic aqu or Text READY to 109-323 to register via text  Ms. Bordenave - following are the goals we discussed in your visit today:   Goals Addressed             This Visit's Progress    Begin and Stick with Counseling-Depression       Timeframe:  Long-Range Goal Priority:  High Start Date:  08/29/21                           Expected End Date: ongoing  Follow Up Date 10/15/21   - check out counseling - keep 90 percent of counseling appointments - schedule counseling appointment    Why is this important?   Beating depression may take some time.  If you don't feel better right away, don't give up on your treatment plan.    Notes:        Dickie La, BSW, MSW, [employer] Managed Medicaid LCSW [facility]  [facility] [email or web address] Phone:  [phone]

## 2021-09-13 NOTE — Patient Outreach (Addendum)
Medicaid Managed Care Social Work Note  09/13/2021 Name:  Kaylee Simpson MRN:  [id number] DOB:  02-May-1976  Kaylee Simpson is an 45 y.o. year old female who is a primary patient of Kaylee Contras, MD.  The Medicaid Managed Care Coordination team was consulted for assistance with:  Mental Health Counseling and Resources  Kaylee Simpson was given information about Medicaid Managed Care Coordination team services today. Kaylee Simpson Patient agreed to services and verbal consent obtained.  Engaged with patient  for by telephone forfollow up visit in response to referral for case management and/or care coordination services.   Assessments/Interventions:  Review of past medical history, allergies, medications, health status, including review of consultants reports, laboratory and other test data, was performed as part of comprehensive evaluation and provision of chronic care management services.  SDOH: (Social Determinant of Health) assessments and interventions performed: SDOH Interventions    Flowsheet Row Most Recent Value  SDOH Interventions   Depression Interventions/Treatment  Referral to Psychiatry       Advanced Directives Status:  See Care Plan for related entries.  Care Plan                 Allergies  Allergen Reactions   Gabapentin Other (See Comments)    Mucscle twitching     Medications Reviewed Today     Reviewed by Vonna Drafts, CPhT (Pharmacy Technician) on 08/02/21 at 1804  Med List Status: Complete   Medication Order Taking? Sig Documenting Provider Last Dose Status Informant  acetaminophen (TYLENOL) 500 MG tablet [id number] Yes Take 2,000 mg by mouth every 6 (six) hours as needed for mild pain. [provider] Past Week Active Self  aspirin EC 325 MG tablet [id number] No Take 1 tablet (325 mg total) by mouth daily.  Patient not taking: No sig reported   Persons, [place], [place] Not Taking Active Self  chlorhexidine (HIBICLENS) 4 % external liquid [id number] No  Apply topically daily as needed.  Patient not taking: No sig reported   Particia Nearing, [place] Not Taking Active Self           Med Note [name], RACHEL J   Fri Aug 02, 2021  6:02 PM) Never picked up  gabapentin (NEURONTIN) 300 MG capsule [id number] Yes Take 300 mg by mouth 3 (three) times daily as needed (pain). Takes 3 times per week [provider] Past Week Active Self  hydrochlorothiazide (HYDRODIURIL) 12.5 MG tablet [id number] Yes Take 12.5 mg by mouth daily. [provider] 08/02/2021 Active Self  ibuprofen (ADVIL) 200 MG tablet [id number] Yes Take 600-800 mg by mouth every 6 (six) hours as needed for headache or mild pain. [provider] Past Week Active Self  meloxicam (MOBIC) 15 MG tablet [id number] Yes Take 1 tablet (15 mg total) by mouth daily.  Patient taking differently: Take 15 mg by mouth daily as needed for pain. Takes 3 times per week   Persons, [place], [place] Past Week Active Self  Norgestim-Eth Estrad Triphasic (TRI-LO-SPRINTEC PO) [id number] Yes Take 1 tablet by mouth daily. [provider] 08/02/2021 Active Self  oxyCODONE-acetaminophen (PERCOCET) 5-325 MG tablet [id number] No Take 1 tablet by mouth every 4 (four) hours as needed for severe pain.  Patient not taking: No sig reported   Persons, [place], [place] Not Taking Active Self  TRI-SPRINTEC 0.18/0.215/0.25 MG-35 MCG tablet [id number]  Take 1 tablet by mouth daily. [provider]  Active Self  Vitamin D, Ergocalciferol, (DRISDOL) 1.25 MG (50000 UNIT) CAPS capsule  [id number] Yes Take 50,000 Units by mouth every 7 (seven) days. [provider] Past Month Active Self           Med Note [name], [name]   Fri Aug 02, 2021  6:04 PM) Out of refills            Patient Active Problem List   Diagnosis Date Noted   Traumatic compartment syndrome of left lower extremity (HCC)    Pain from implanted hardware    Acute metabolic encephalopathy 07/15/2020   Sepsis (HCC) 07/15/2020    Aspiration pneumonia of both lower lobes due to gastric secretions (HCC) 07/15/2020   Polysubstance abuse (HCC) 07/15/2020   Drug overdose 07/15/2020   Drug overdose, accidental or unintentional, initial encounter 07/14/2020   Closed nondisplaced oblique fracture of shaft of fibula with routine healing 12/05/2019   Trochanteric bursitis, left hip 05/24/2018   Pain in left ankle and joints of left foot 05/24/2018   Chronic pain of left ankle 12/10/2016   Status post open reduction with internal fixation (ORIF) of fracture of ankle 12/10/2016   Status post ORIF of fracture of ankle 04/16/2016   Anemia 10/08/2012   Pyelonephritis 10/05/2012   Hypokalemia 10/05/2012   Hyponatremia 10/05/2012   Tachycardia 10/05/2012   SIRS (systemic inflammatory response syndrome) (HCC) 10/05/2012   Acute renal insufficiency 10/05/2012    Conditions to be addressed/monitored per PCP order:  Anxiety and Depression  Care Plan : General Social Work (Adult)  Updates made by Gustavus Bryant, LCSW since 09/13/2021 12:00 AM     Problem: Depression Identification (Depression)      Long-Range Goal: Depressive Symptoms Identified   Start Date: 08/29/2021  Priority: High  Note:    Timeframe:  Long-Range Goal Priority:  High Start Date:  08/29/21                           Expected End Date: ongoing  Follow Up Date 10/15/21   - check out counseling - keep 90 percent of counseling appointments - schedule counseling appointment    Why is this important?   Beating depression may take some time.  If you don't feel better right away, don't give up on your treatment plan.    Current barriers:   Chronic Mental Health needs related to depression Limited social support and Mental Health Concerns  Needs Support, Education, and Care Coordination in order to meet unmet mental health needs. Clinical Goal(s): demonstrate a reduction in symptoms related to :Depression, connect with provider for ongoing mental  health treatment.  , and increase coping skills, healthy habits, self-management skills, and stress reduction     Clinical Interventions:  Patient is remembering things from her past that were traumatic to her and would to connect with a long term counselor. Referral made for  counseling only (patient does not want medication management) to [facility] on 08/29/21. Update- Secure message to Earnestine Mealing at [facility] on 09/13/21. Patient has upcoming therapy appointment on 10/15/21. Update provided to patient.  Inter-disciplinary care team collaboration (see longitudinal plan of care) Assessed patient's previous and current treatment, coping skills, support system and barriers to care  Review various resources, discussed options and provided patient information about Options for mental health treatment based on need and insurance Solution-Focused Strategies, Mindfulness or Relaxation Training, Active listening / Reflection utilized , Emotional Supportive Provided, Behavioral Activation, Motivational Interviewing, Provided brief CBT , Suicidal Ideation/Homicidal Ideation assessed:, Discussed Health Care Power of Jeff ,  and Made referral to counseling  ; Patient Goals/Self-Care Activities: Over the next 120 days - participation in mental health treatment encouraged - self-awareness of emotional triggers encouraged - strategies to manage emotional triggers promoted - suicide risk screen reviewed - avoid negative self-talk - develop a personal safety plan - develop a plan to deal with triggers like holidays, anniversaries - exercise at least 2 to 3 times per week - have a plan for how to handle bad days - journal feelings and what helps to feel better or worse - spend time or talk with others at least 2 to 3 times per week - spend time or talk with others every day - watch for early signs of feeling worse - write in journal every day - begin personal counseling - call and visit an old friend - check out  volunteer opportunities - join a support group - laugh; watch a funny movie or comedian - learn and use visualization or guided imagery - perform a random act of kindness - practice relaxation or meditation daily - start or continue a personal journal - talk about feelings with a friend, family or spiritual advisor - practice positive thinking and self-talk I have placed a referral to [facility] for counseling only       Follow up:  Patient agrees to Care Plan and Follow-up.  Plan: The Managed Medicaid care management team will reach out to the patient again over the next 45 days. Follow up appointment on 10/15/21.    Dickie La, BSW, MSW, [employer] Managed Medicaid LCSW [facility]  [facility] [email or web address] Phone: [phone]

## 2021-09-18 ENCOUNTER — Other Ambulatory Visit: Payer: Self-pay

## 2021-09-18 ENCOUNTER — Other Ambulatory Visit: Payer: Self-pay | Admitting: Obstetrics and Gynecology

## 2021-09-18 NOTE — Patient Instructions (Signed)
Hi Kaylee Simpson, thank you for speaking with me today, have a great afternoon.  Kaylee Simpson was given information about Medicaid Managed Care team care coordination services as a part of their [facility] Medicaid benefit. Kaylee Simpson verbally consented to engagement with the [facility] Managed Care team.   If you are experiencing a medical emergency, please call 911 or report to your local emergency department or urgent care.   If you have a non-emergency medical problem during routine business hours, please contact your provider's office and ask to speak with a nurse.   For questions related to your [facility] health plan, please call: [phone] or go here:[email or web address]  If you would like to schedule transportation through your [facility] plan, please call the following number at least 2 days in advance of your appointment: [phone].  Call the [facility] Crisis Line at [phone], at any time, 24 hours a day, 7 days a week. If you are in danger or need immediate medical attention call 911.  If you would like help to quit smoking, call 1-800-QUIT-NOW (2154639880) OR Espaol: 1-855-Djelo-Ya (5-732-202-5427) o para ms informacin haga clic aqu or Text READY to 062-376 to register via text  Kaylee Simpson - following are the goals we discussed in your visit today:   Goals Addressed             This Visit's Progress    Protect My Health       Timeframe:  Long-Range Goal Priority:  High Start Date:      08/19/21                       Expected End Date:    ongoing                   Follow Up Date: 10/18/21   - practice safe sex - schedule appointment for flu shot - schedule appointment for vaccines needed due to my age or health - schedule recommended health tests (blood work, mammogram, colonoscopy, pap test) - schedule and keep appointment for annual check-up    Why is this important?   Screening tests can find diseases early when they are  easier to treat.  Your doctor or nurse will talk with you about which tests are important for you.  Getting shots for common diseases like the flu and shingles will help prevent them.     Notes:  Update 09/18/21:  Patient has therapy appointment scheduled for this month.  Needs GYN, MMG referral-will place.       The patient verbalized understanding of instructions provided today and declined a print copy of patient instruction materials.   The Managed Medicaid care management team will reach out to the patient again over the next 30 days.  The  Patient has been provided with contact information for the Managed Medicaid care management team and has been advised to call with any health related questions or concerns.   Kaylee Der RN, BSN [place]  [employer], [employer] - Managed Medicaid High Risk [phone].  Following is a copy of your plan of care:  Patient Care Plan: Behavioral Health     Problem Identified: Behavioral health   Priority: High  Onset Date: 08/19/2021     Long-Range Goal: Self-Management Plan Developed   Start Date: 08/19/2021  Expected End Date: 11/19/2021  Recent Progress: Not on track  Priority: High  Note:   Current Barriers:  Knowledge Deficits related to plan  of care for management of anxiety, depression  Care Coordination needs related to Financial constraints related to food, Transportation, Limited access to food, Housing barriers, Mental Health Concerns , and Lacks knowledge of community resource: for food, housing.  Also needs GYN, MMG referral. Financial Constraints.  Transportation barriers  RNCM Clinical Goal(s):  Patient will verbalize understanding of plan for management of Anxiety and Depression take all medications exactly as prescribed and will call provider for medication related questions attend all scheduled medical appointments: work with pharmacist to address medications. work with Child psychotherapist  to address the management of Anxiety and Depression through collaboration with Medical illustrator, provider, and care team.   Interventions: Inter-disciplinary care team collaboration (see longitudinal plan of care) Evaluation of current treatment plan related to  self management and patient's adherence to plan as established by provider Collaboration with pharmacist-completed and continues to follow. Collaboration with LCSW for anxiety/depression-LCSW following-appointment for therapy scheduled. Collaboration with BSW for food/housing resources and GYN/MMG referral.  BSW attempted to call patient and was unsuccessful.  Patient Goals/Self-Care Activities: Patient will self administer medications as prescribed Patient will attend all scheduled provider appointments Patient will call pharmacy for medication refills Patient will continue to perform ADL's independently Patient will continue to perform IADL's independently Patient will call provider office for new concerns or questions Patient will work with BSW to address care coordination needs and will continue to work with the clinical team to address health care and disease management related needs.   Patient will work with LCSW for anxiety/depression.   Evidence-based guidance:   Review biopsychosocial determinants of health screens.  Review need for preventive screening based on age, sex, family history and health history.  Determine level of modifiable health risk.  Discuss identified risks.  Identify areas where behavior change may lead to improved health.  Promote healthy lifestyle.  Evoke change talk using open-ended questions, pros and cons, as well as looking forward.  Identify and manage conditions or preconditions to reduce health risk.  Implement additional goals and interventions based on identified risk factors.

## 2021-09-18 NOTE — Patient Outreach (Signed)
Medicaid Managed Care   Nurse Care Manager Note  09/18/2021 Name:  Kaylee Simpson MRN:  [id number] DOB:  1976-02-20  Kaylee Simpson is an 45 y.o. year old female who is a primary patient of Fleet Contras, MD.  The Medicaid Managed Care Coordination team was consulted for assistance with:    Healthcare management needs.  Ms. Hetzer was given information about Medicaid Managed Care Coordination team services today. Kaylee Simpson Patient agreed to services and verbal consent obtained.  Engaged with patient by telephone for follow up visit in response to provider referral for case management and/or care coordination services.   Assessments/Interventions:  Review of past medical history, allergies, medications, health status, including review of consultants reports, laboratory and other test data, was performed as part of comprehensive evaluation and provision of chronic care management services.  SDOH (Social Determinants of Health) assessments and interventions performed: SDOH Interventions    Flowsheet Row Most Recent Value  SDOH Interventions   Housing Interventions Other (Comment)  [BSW referral for resources placed.]       Care Plan  Allergies  Allergen Reactions   Gabapentin Other (See Comments)    Mucscle twitching     Medications Reviewed Today     Reviewed by Danie Chandler, RN (Registered Nurse) on 09/18/21 at 1231  Med List Status: <None>   Medication Order Taking? Sig Documenting Provider Last Dose Status Informant  acetaminophen (TYLENOL) 500 MG tablet [id number] No Take 500 mg by mouth every 6 (six) hours as needed for mild pain. [provider] Taking Active Self  aspirin EC 325 MG tablet [id number] No Take 1 tablet (325 mg total) by mouth daily.  Patient not taking: Reported on 08/28/2021   Persons, [place], [place] Not Taking Active Self  chlorhexidine (HIBICLENS) 4 % external liquid [id number] No Apply topically daily as needed.  Patient not taking: No sig  reported   Particia Simpson, [place] Not Taking Active Self           Med Note Kaylee Simpson, RACHEL J   Fri Aug 02, 2021  6:02 PM) Never picked up  gabapentin (NEURONTIN) 300 MG capsule [id number] No Take 300 mg by mouth 3 (three) times daily as needed (pain). Takes 3 times per week [provider] Taking Active Self  hydrochlorothiazide (HYDRODIURIL) 12.5 MG tablet [id number] No Take 12.5 mg by mouth daily.  Patient not taking: No sig reported   [provider] Not Taking Active   ibuprofen (ADVIL) 200 MG tablet [id number] No Take 600-800 mg by mouth every 6 (six) hours as needed for headache or mild pain. [provider] Taking Active Self  losartan-hydrochlorothiazide (HYZAAR) 50-12.5 MG tablet [id number]  Take 1 tablet by mouth daily. [provider]  Active   meloxicam (MOBIC) 15 MG tablet [id number] No Take 1 tablet (15 mg total) by mouth daily.  Patient not taking: Reported on 08/28/2021   Persons, [place], [place] Not Taking Active   Norgestim-Eth Estrad Triphasic (TRI-LO-SPRINTEC PO) [id number] No Take 1 tablet by mouth daily.  Patient not taking: Reported on 08/28/2021   [provider] Not Taking Active   oxyCODONE-acetaminophen (PERCOCET) 5-325 MG tablet [id number] No Take 1 tablet by mouth every 4 (four) hours as needed for severe pain.  Patient not taking: No sig reported   Persons, [place], [place] Not Taking Active Self  TRI-SPRINTEC 0.18/0.215/0.25 MG-35 MCG tablet [id number] No Take 1 tablet by mouth daily. [provider] Taking Active Self  Vitamin D, Ergocalciferol, (DRISDOL)  1.25 MG (50000 UNIT) CAPS capsule [id number] No Take 50,000 Units by mouth every 7 (seven) days. taking [provider] Taking Active Self           Med Note Kaylee Simpson, Kaylee Simpson   Fri Aug 02, 2021  6:04 PM) Out of refills            Patient Active Problem List   Diagnosis Date Noted   Traumatic compartment syndrome of left lower extremity (HCC)    Pain  from implanted hardware    Acute metabolic encephalopathy 07/15/2020   Sepsis (HCC) 07/15/2020   Aspiration pneumonia of both lower lobes due to gastric secretions (HCC) 07/15/2020   Polysubstance abuse (HCC) 07/15/2020   Drug overdose 07/15/2020   Drug overdose, accidental or unintentional, initial encounter 07/14/2020   Closed nondisplaced oblique fracture of shaft of fibula with routine healing 12/05/2019   Trochanteric bursitis, left hip 05/24/2018   Pain in left ankle and joints of left foot 05/24/2018   Chronic pain of left ankle 12/10/2016   Status post open reduction with internal fixation (ORIF) of fracture of ankle 12/10/2016   Status post ORIF of fracture of ankle 04/16/2016   Anemia 10/08/2012   Pyelonephritis 10/05/2012   Hypokalemia 10/05/2012   Hyponatremia 10/05/2012   Tachycardia 10/05/2012   SIRS (systemic inflammatory response syndrome) (HCC) 10/05/2012   Acute renal insufficiency 10/05/2012    Conditions to be addressed/monitored per PCP order:  Anxiety, Depression, and resources for food, housing, GYN/MMG.  Care Plan : Behavioral Health  Updates made by Danie Chandler, RN since 09/18/2021 12:00 AM     Problem: Behavioral health   Priority: High  Onset Date: 08/19/2021     Long-Range Goal: Self-Management Plan Developed   Start Date: 08/19/2021  Expected End Date: 11/19/2021  Recent Progress: Not on track  Priority: High  Note:   Current Barriers:  Knowledge Deficits related to plan of care for management of anxiety, depression  Care Coordination needs related to Financial constraints related to food, Transportation, Limited access to food, Housing barriers, Mental Health Concerns , and Lacks knowledge of community resource: for food, housing.  Also needs GYN, MMG referral. Financial Constraints.  Transportation barriers  RNCM Clinical Goal(s):  Patient will verbalize understanding of plan for management of Anxiety and Depression take all medications  exactly as prescribed and will call provider for medication related questions attend all scheduled medical appointments: work with pharmacist to address medications. work with Child psychotherapist to address the management of Anxiety and Depression through collaboration with Medical illustrator, provider, and care team.   Interventions: Inter-disciplinary care team collaboration (see longitudinal plan of care) Evaluation of current treatment plan related to  self management and patient's adherence to plan as established by provider Collaboration with pharmacist-completed and continues to follow. Collaboration with LCSW for anxiety/depression-LCSW following-appointment for therapy scheduled. Collaboration with BSW for food/housing resources and GYN/MMG referral.  BSW attempted to call patient and was unsuccessful.  Patient Goals/Self-Care Activities: Patient will self administer medications as prescribed Patient will attend all scheduled provider appointments Patient will call pharmacy for medication refills Patient will continue to perform ADL's independently Patient will continue to perform IADL's independently Patient will call provider office for new concerns or questions Patient will work with BSW to address care coordination needs and will continue to work with the clinical team to address health care and disease management related needs.   Patient will work with LCSW for anxiety/depression.    Evidence-based guidance:  Review biopsychosocial determinants of health screens.  Review need for preventive screening based on age, sex, family history and health history.  Determine level of modifiable health risk.  Discuss identified risks.  Identify areas where behavior change may lead to improved health.  Promote healthy lifestyle.  Evoke change talk using open-ended questions, pros and cons, as well as looking forward.  Identify and manage conditions or preconditions to reduce health risk.   Implement additional goals and interventions based on identified risk factors.    Follow Up:  Patient agrees to Care Plan and Follow-up.  Plan: The Managed Medicaid care management team will reach out to the patient again over the next 30 days. and The patient has been provided with contact information for the Managed Medicaid care management team and has been advised to call with any health related questions or concerns.  Date/time of next scheduled RN care management/care coordination outreach:  10/18/21 at 1030.

## 2021-09-19 ENCOUNTER — Other Ambulatory Visit: Payer: Self-pay

## 2021-09-19 NOTE — Patient Instructions (Signed)
Visit Information  Ms. [employer]  - as a part of your Medicaid benefit, you are eligible for care management and care coordination services at no cost or copay. I was unable to reach you by phone today but would be happy to help you with your health related needs. Please feel free to call me @ [phone].   A member of the Managed Medicaid care management team will reach out to you again over the next 7 days.  Gus Puma, BSW, [place] [facility]  [employer] Risk Managed Medicaid Team  [phone]

## 2021-09-19 NOTE — Patient Outreach (Signed)
Care Coordination  09/19/2021  Kaylee Simpson Aug 14, 1976 [id number]   Medicaid Managed Care   Unsuccessful Outreach Note  09/19/2021 Name: Kaylee Simpson MRN: [id number] DOB: 1976/06/13  Referred by: Fleet Contras, MD Reason for referral : High Risk Managed Medicaid (MM Social Work Unsuccessful Telephone Outreach)   A second unsuccessful telephone outreach was attempted today. The patient was referred to the case management team for assistance with care management and care coordination.   Follow Up Plan: The care management team will reach out to the patient again over the next 7 days.   Gus Puma, BSW, [place] [facility]  [employer] Risk Managed Medicaid Team  [phone]

## 2021-09-28 DIAGNOSIS — Z419 Encounter for procedure for purposes other than remedying health state, unspecified: Secondary | ICD-10-CM | POA: Diagnosis not present

## 2021-09-30 ENCOUNTER — Other Ambulatory Visit: Payer: Self-pay

## 2021-09-30 NOTE — Patient Outreach (Signed)
Medicaid Managed Care Social Work Note  09/30/2021 Name:  Kaylee Simpson MRN:  [id number] DOB:  1976/06/24  Kaylee Simpson is an 45 y.o. year old female who is a primary patient of Kaylee Contras, MD.  The Medicaid Managed Care Coordination team was consulted for assistance with:  [employer]   [name] was given information about Medicaid Managed Care Coordination team services today. Kaylee Simpson Patient agreed to services and verbal consent obtained.  Engaged with patient  for by telephone forinitial visit in response to referral for case management and/or care coordination services.   Assessments/Interventions:  Review of past medical history, allergies, medications, health status, including review of consultants reports, laboratory and other test data, was performed as part of comprehensive evaluation and provision of chronic care management services.  SDOH: (Social Determinant of Health) assessments and interventions performed: BSW completed appointment with patient. Patient states she is currently looking for somewhere to move. She states she lives in a nice boarding house for 500 monthly but is starting to feel like the mother of the house. She would prefer a  bedroom. Patient states she would like for BSW to find and schedule her an appointment with an OBGYN and also get her mammogram done at the same time. BSW will research apartments and OBGYN's and get back with patient.   Advanced Directives Status:  Not addressed in this encounter.  Care Plan                 Allergies  Allergen Reactions   Gabapentin Other (See Comments)    Mucscle twitching     Medications Reviewed Today     Reviewed by Kaylee Chandler, RN (Registered Nurse) on 09/18/21 at 1231  Med List Status: <None>   Medication Order Taking? Sig Documenting Provider Last Dose Status Informant  acetaminophen (TYLENOL) 500 MG tablet [id number] No Take 500 mg by mouth every 6 (six) hours as needed for mild pain.  [provider] Taking Active Self  aspirin EC 325 MG tablet [id number] No Take 1 tablet (325 mg total) by mouth daily.  Patient not taking: Reported on 08/28/2021   Persons, Kaylee Simpson, Kaylee Simpson Not Taking Active Self  chlorhexidine (HIBICLENS) 4 % external liquid [id number] No Apply topically daily as needed.  Patient not taking: No sig reported   Kaylee Simpson, Kaylee Simpson Not Taking Active Self           Med Note [name], RACHEL J   Fri Aug 02, 2021  6:02 PM) Never picked up  gabapentin (NEURONTIN) 300 MG capsule [id number] No Take 300 mg by mouth 3 (three) times daily as needed (pain). Takes 3 times per week [provider] Taking Active Self  hydrochlorothiazide (HYDRODIURIL) 12.5 MG tablet [id number] No Take 12.5 mg by mouth daily.  Patient not taking: No sig reported   [provider] Not Taking Active   ibuprofen (ADVIL) 200 MG tablet [id number] No Take 600-800 mg by mouth every 6 (six) hours as needed for headache or mild pain. [provider] Taking Active Self  losartan-hydrochlorothiazide (HYZAAR) 50-12.5 MG tablet [id number]  Take 1 tablet by mouth daily. [provider]  Active   meloxicam (MOBIC) 15 MG tablet [id number] No Take 1 tablet (15 mg total) by mouth daily.  Patient not taking: Reported on 08/28/2021   Persons, Kaylee Simpson, Kaylee Simpson Not Taking Active   Norgestim-Eth Estrad Triphasic (TRI-LO-SPRINTEC PO) [id number] No Take 1 tablet by mouth daily.  Patient not taking: Reported on 08/28/2021  [provider] Not Taking Active   oxyCODONE-acetaminophen (PERCOCET) 5-325 MG tablet [id number] No Take 1 tablet by mouth every 4 (four) hours as needed for severe pain.  Patient not taking: No sig reported   Persons, Kaylee Simpson, Kaylee Simpson Not Taking Active Self  TRI-SPRINTEC 0.18/0.215/0.25 MG-35 MCG tablet [id number] No Take 1 tablet by mouth daily. [provider] Taking Active Self  Vitamin D, Ergocalciferol, (DRISDOL) 1.25 MG (50000 UNIT) CAPS  capsule [id number] No Take 50,000 Units by mouth every 7 (seven) days. taking [provider] Taking Active Self           Med Note [name], [name]   Fri Aug 02, 2021  6:04 PM) Out of refills            Patient Active Problem List   Diagnosis Date Noted   Traumatic compartment syndrome of left lower extremity (HCC)    Pain from implanted hardware    Acute metabolic encephalopathy 07/15/2020   Sepsis (HCC) 07/15/2020   Aspiration pneumonia of both lower lobes due to gastric secretions (HCC) 07/15/2020   Polysubstance abuse (HCC) 07/15/2020   Drug overdose 07/15/2020   Drug overdose, accidental or unintentional, initial encounter 07/14/2020   Closed nondisplaced oblique fracture of shaft of fibula with routine healing 12/05/2019   Trochanteric bursitis, left hip 05/24/2018   Pain in left ankle and joints of left foot 05/24/2018   Chronic pain of left ankle 12/10/2016   Status post open reduction with internal fixation (ORIF) of fracture of ankle 12/10/2016   Status post ORIF of fracture of ankle 04/16/2016   Anemia 10/08/2012   Pyelonephritis 10/05/2012   Hypokalemia 10/05/2012   Hyponatremia 10/05/2012   Tachycardia 10/05/2012   SIRS (systemic inflammatory response syndrome) (HCC) 10/05/2012   Acute renal insufficiency 10/05/2012    Conditions to be addressed/monitored per PCP order:   OBGYN  There are no care plans that you recently modified to display for this patient.   Follow up:  Patient agrees to Care Plan and Follow-up.  Plan: The Managed Medicaid care management team will reach out to the patient again over the next 4 days.  Date/time of next scheduled Social Work care management/care coordination outreach:  10/04/21  Gus Puma, Kenard Gower, [facility]  [facility]  High Risk Managed Medicaid Team  [phone]

## 2021-09-30 NOTE — Patient Instructions (Signed)
Visit Information  [name] was given information about Medicaid Managed Care team care coordination services as a part of their [facility] Medicaid benefit. Kaylee Simpson verbally consented to engagement with the [facility] Managed Care team.   If you are experiencing a medical emergency, please call 911 or report to your local emergency department or urgent care.   If you have a non-emergency medical problem during routine business hours, please contact your provider's office and ask to speak with a nurse.   For questions related to your [facility] health plan, please call: [phone] or go here:[email or web address]  If you would like to schedule transportation through your [facility] plan, please call the following number at least 2 days in advance of your appointment: [phone].  Call the [facility] Crisis Line at [phone], at any time, 24 hours a day, 7 days a week. If you are in danger or need immediate medical attention call 911.  If you would like help to quit smoking, call 1-800-QUIT-NOW (223-527-1957) OR Espaol: 1-855-Djelo-Ya (3-785-885-0277) o para ms informacin haga clic aqu or Text READY to 412-878 to register via text  [name] - following are the goals we discussed in your visit today:   Goals Addressed   None     Social Worker will follow up with patient.   Kaylee Simpson, BSW, [place] [facility]    High Risk Managed Medicaid Team  [phone]   Following is a copy of your plan of care:

## 2021-10-15 ENCOUNTER — Other Ambulatory Visit: Payer: Self-pay | Admitting: Licensed Clinical Social Worker

## 2021-10-15 ENCOUNTER — Other Ambulatory Visit: Payer: Self-pay

## 2021-10-15 ENCOUNTER — Ambulatory Visit (INDEPENDENT_AMBULATORY_CARE_PROVIDER_SITE_OTHER): Payer: Medicaid Other | Admitting: Clinical

## 2021-10-15 DIAGNOSIS — Z7689 Persons encountering health services in other specified circumstances: Secondary | ICD-10-CM | POA: Diagnosis not present

## 2021-10-15 DIAGNOSIS — F331 Major depressive disorder, recurrent, moderate: Secondary | ICD-10-CM | POA: Diagnosis not present

## 2021-10-15 DIAGNOSIS — F1021 Alcohol dependence, in remission: Secondary | ICD-10-CM

## 2021-10-15 DIAGNOSIS — F1421 Cocaine dependence, in remission: Secondary | ICD-10-CM

## 2021-10-15 DIAGNOSIS — F1220 Cannabis dependence, uncomplicated: Secondary | ICD-10-CM

## 2021-10-15 NOTE — Patient Instructions (Signed)
Visit Information  Ms. Madan was given information about Medicaid Managed Care team care coordination services as a part of their [facility] Medicaid benefit. Annaleah Parlier verbally consented to engagement with the [facility] Managed Care team.   If you are experiencing a medical emergency, please call 911 or report to your local emergency department or urgent care.   If you have a non-emergency medical problem during routine business hours, please contact your provider's office and ask to speak with a nurse.   For questions related to your [facility] health plan, please call: [phone] or go here:[email or web address]  If you would like to schedule transportation through your [facility] plan, please call the following number at least 2 days in advance of your appointment: [phone].  Call the [facility] Crisis Line at [phone], at any time, 24 hours a day, 7 days a week. If you are in danger or need immediate medical attention call 911.  If you would like help to quit smoking, call 1-800-QUIT-NOW ((216)841-0959) OR Espaol: 1-855-Djelo-Ya (4-163-845-3646) o para ms informacin haga clic aqu or Text READY to 803-212 to register via text  Ms. Ellingwood - following are the goals we discussed in your visit today:   Goals Addressed             This Visit's Progress    Begin and Stick with Counseling-Depression       Timeframe:  Long-Range Goal Priority:  High Start Date:  08/29/21                           Expected End Date: ongoing  Follow Up Date -10/29/21   - check out counseling - keep 90 percent of counseling appointments - schedule counseling appointment    Why is this important?   Beating depression may take some time.  If you don't feel better right away, don't give up on your treatment plan.    Notes:         Dickie La, BSW, MSW, [employer] Managed Medicaid LCSW [facility]  [facility] [email or web address] Phone: [phone]

## 2021-10-15 NOTE — Patient Outreach (Signed)
Medicaid Managed Care Social Work Note  10/15/2021 Name:  Kaylee Simpson MRN:  [id number] DOB:  [birth year]  Kaylee Simpson is an 45 y.o. year old female who is a primary patient of Fleet Contras, MD.  The Medicaid Managed Care Coordination team was consulted for assistance with:  Mental Health Counseling and Resources  Kaylee Simpson was given information about Medicaid Managed Care Coordination team services today. Kaylee Simpson Patient agreed to services and verbal consent obtained.  Engaged with patient  for by telephone forfollow up visit in response to referral for case management and/or care coordination services.   Assessments/Interventions:  Review of past medical history, allergies, medications, health status, including review of consultants reports, laboratory and other test data, was performed as part of comprehensive evaluation and provision of chronic care management services.  SDOH: (Social Determinant of Health) assessments and interventions performed: SDOH Interventions    Flowsheet Row Most Recent Value  SDOH Interventions   Stress Interventions Provide Counseling       Advanced Directives Status:  See Care Plan for related entries.  Care Plan                 Allergies  Allergen Reactions   Gabapentin Other (See Comments)    Mucscle twitching     Medications Reviewed Today     Reviewed by Danie Chandler, RN (Registered Nurse) on 09/18/21 at 1231  Med List Status: <None>   Medication Order Taking? Sig Documenting Provider Last Dose Status Informant  acetaminophen (TYLENOL) 500 MG tablet [id number] No Take 500 mg by mouth every 6 (six) hours as needed for mild pain. [provider] Taking Active Self  aspirin EC 325 MG tablet [id number] No Take 1 tablet (325 mg total) by mouth daily.  Patient not taking: Reported on 08/28/2021   Persons, [place], [place] Not Taking Active Self  chlorhexidine (HIBICLENS) 4 % external liquid [id number] No Apply topically daily as  needed.  Patient not taking: No sig reported   Particia Simpson, [place] Not Taking Active Self           Med Note Kaylee Simpson, RACHEL J   Fri Aug 02, 2021  6:02 PM) Never picked up  gabapentin (NEURONTIN) 300 MG capsule [id number] No Take 300 mg by mouth 3 (three) times daily as needed (pain). Takes 3 times per week [provider] Taking Active Self  hydrochlorothiazide (HYDRODIURIL) 12.5 MG tablet [id number] No Take 12.5 mg by mouth daily.  Patient not taking: No sig reported   [provider] Not Taking Active   ibuprofen (ADVIL) 200 MG tablet [id number] No Take 600-800 mg by mouth every 6 (six) hours as needed for headache or mild pain. [provider] Taking Active Self  losartan-hydrochlorothiazide (HYZAAR) 50-12.5 MG tablet [id number]  Take 1 tablet by mouth daily. [provider]  Active   meloxicam (MOBIC) 15 MG tablet [id number] No Take 1 tablet (15 mg total) by mouth daily.  Patient not taking: Reported on 08/28/2021   Persons, [place], [place] Not Taking Active   Norgestim-Eth Estrad Triphasic (TRI-LO-SPRINTEC PO) [id number] No Take 1 tablet by mouth daily.  Patient not taking: Reported on 08/28/2021   [provider] Not Taking Active   oxyCODONE-acetaminophen (PERCOCET) 5-325 MG tablet [id number] No Take 1 tablet by mouth every 4 (four) hours as needed for severe pain.  Patient not taking: No sig reported   Persons, [place], [place] Not Taking Active Self  TRI-SPRINTEC 0.18/0.215/0.25 MG-35 MCG tablet [id number] No  Take 1 tablet by mouth daily. [provider] Taking Active Self  Vitamin D, Ergocalciferol, (DRISDOL) 1.25 MG (50000 UNIT) CAPS capsule [id number] No Take 50,000 Units by mouth every 7 (seven) days. taking [provider] Taking Active Self           Med Note Kaylee Simpson, Kaylee Simpson   Fri Aug 02, 2021  6:04 PM) Out of refills            Patient Active Problem List   Diagnosis Date Noted   Traumatic compartment syndrome of  left lower extremity (HCC)    Pain from implanted hardware    Acute metabolic encephalopathy 07/15/2020   Sepsis (HCC) 07/15/2020   Aspiration pneumonia of both lower lobes due to gastric secretions (HCC) 07/15/2020   Polysubstance abuse (HCC) 07/15/2020   Drug overdose 07/15/2020   Drug overdose, accidental or unintentional, initial encounter 07/14/2020   Closed nondisplaced oblique fracture of shaft of fibula with routine healing 12/05/2019   Trochanteric bursitis, left hip 05/24/2018   Pain in left ankle and joints of left foot 05/24/2018   Chronic pain of left ankle 12/10/2016   Status post open reduction with internal fixation (ORIF) of fracture of ankle 12/10/2016   Status post ORIF of fracture of ankle 04/16/2016   Anemia 10/08/2012   Pyelonephritis 10/05/2012   Hypokalemia 10/05/2012   Hyponatremia 10/05/2012   Tachycardia 10/05/2012   SIRS (systemic inflammatory response syndrome) (HCC) 10/05/2012   Acute renal insufficiency 10/05/2012    Conditions to be addressed/monitored per PCP order:  Anxiety and Depression  Care Plan : General Social Work (Adult)  Updates made by Gustavus Bryant, LCSW since 10/15/2021 12:00 AM     Problem: Depression Identification (Depression)      Long-Range Goal: Depressive Symptoms Identified   Start Date: 08/29/2021  Priority: High  Note:    Timeframe:  Long-Range Goal Priority:  High Start Date:  08/29/21                           Expected End Date: ongoing  Follow Up Date 10/29/21   - check out counseling - keep 90 percent of counseling appointments - schedule counseling appointment    Why is this important?   Beating depression may take some time.  If you don't feel better right away, don't give up on your treatment plan.    Current barriers:   Chronic Mental Health needs related to depression Limited social support and Mental Health Concerns  Needs Support, Education, and Care Coordination in order to meet unmet mental health  needs. Clinical Goal(s): demonstrate a reduction in symptoms related to :Depression, connect with provider for ongoing mental health treatment.  , and increase coping skills, healthy habits, self-management skills, and stress reduction     Clinical Interventions:  Patient is remembering things from her past that were traumatic to her and would to connect with a long term counselor. Referral made for  counseling only (patient does not want medication management) to [facility] on 08/29/21. Update- Secure message to Earnestine Mealing at [facility] on 09/13/21. Patient has upcoming therapy appointment on 10/15/21. Update provided to patient. Update on 10/15/21- Patients reports that she successfully attended her therapist appointment but [facility] and will start counseling there on 10/21/21.  Inter-disciplinary care team collaboration (see longitudinal plan of care) Assessed patient's previous and current treatment, coping skills, support system and barriers to care  Review various  resources, discussed options and provided patient information about Options for mental health treatment based on need and insurance Solution-Focused Strategies, Mindfulness or Management consultant, Active listening / Reflection utilized , Emotional Supportive Provided, Mining engineer, Motivational Interviewing, Provided brief CBT , Suicidal Ideation/Homicidal Ideation assessed:, Discussed Health Care Power of Attorney , and Made referral to counseling  ; Patient Goals/Self-Care Activities: Over the next 120 days - participation in mental health treatment encouraged - self-awareness of emotional triggers encouraged - strategies to manage emotional triggers promoted - suicide risk screen reviewed - avoid negative self-talk - develop a personal safety plan - develop a plan to deal with triggers like holidays, anniversaries - exercise at least 2 to 3 times per week - have a plan for how to handle bad  days - journal feelings and what helps to feel better or worse - spend time or talk with others at least 2 to 3 times per week - spend time or talk with others every day - watch for early signs of feeling worse - write in journal every day - begin personal counseling - call and visit an old friend - check out volunteer opportunities - join a support group - laugh; watch a funny movie or comedian - learn and use visualization or guided imagery - perform a random act of kindness - practice relaxation or meditation daily - start or continue a personal journal - talk about feelings with a friend, family or spiritual advisor - practice positive thinking and self-talk I have placed a referral to [facility] for counseling only       Follow up:  Patient agrees to Care Plan and Follow-up.  Plan: The Managed Medicaid care management team will reach out to the patient again over the next 30 days.  Date of next scheduled Social Work care management/care coordination outreach:  10/29/21  Dickie La, BSW, MSW, LCSW Managed Medicaid LCSW [facility]  [facility] [email or web address] Phone: [phone]
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
## 2021-10-21 DIAGNOSIS — Z7689 Persons encountering health services in other specified circumstances: Secondary | ICD-10-CM | POA: Diagnosis not present

## 2021-10-29 DIAGNOSIS — Z419 Encounter for procedure for purposes other than remedying health state, unspecified: Secondary | ICD-10-CM | POA: Diagnosis not present

## 2021-11-04 DIAGNOSIS — F331 Major depressive disorder, recurrent, moderate: Secondary | ICD-10-CM | POA: Diagnosis not present

## 2021-11-04 DIAGNOSIS — Z7689 Persons encountering health services in other specified circumstances: Secondary | ICD-10-CM | POA: Diagnosis not present

## 2021-11-11 DIAGNOSIS — F331 Major depressive disorder, recurrent, moderate: Secondary | ICD-10-CM | POA: Diagnosis not present

## 2021-11-18 DIAGNOSIS — F331 Major depressive disorder, recurrent, moderate: Secondary | ICD-10-CM | POA: Diagnosis not present

## 2021-11-28 DIAGNOSIS — Z419 Encounter for procedure for purposes other than remedying health state, unspecified: Secondary | ICD-10-CM | POA: Diagnosis not present

## 2021-12-02 DIAGNOSIS — F331 Major depressive disorder, recurrent, moderate: Secondary | ICD-10-CM | POA: Diagnosis not present

## 2021-12-29 DIAGNOSIS — Z419 Encounter for procedure for purposes other than remedying health state, unspecified: Secondary | ICD-10-CM | POA: Diagnosis not present

## 2021-12-30 DIAGNOSIS — F331 Major depressive disorder, recurrent, moderate: Secondary | ICD-10-CM | POA: Diagnosis not present

## 2022-01-02 DIAGNOSIS — U071 COVID-19: Secondary | ICD-10-CM | POA: Diagnosis not present

## 2022-01-29 DIAGNOSIS — Z419 Encounter for procedure for purposes other than remedying health state, unspecified: Secondary | ICD-10-CM | POA: Diagnosis not present

## 2022-02-25 ENCOUNTER — Ambulatory Visit: Payer: Self-pay

## 2022-02-26 DIAGNOSIS — Z419 Encounter for procedure for purposes other than remedying health state, unspecified: Secondary | ICD-10-CM | POA: Diagnosis not present

## 2022-03-19 ENCOUNTER — Other Ambulatory Visit: Payer: Self-pay | Admitting: Obstetrics and Gynecology

## 2022-03-19 ENCOUNTER — Other Ambulatory Visit: Payer: Self-pay

## 2022-03-19 NOTE — Patient Instructions (Signed)
Hi Ms. Segers, nice to catch up with you today-have a good afternoon!! ? ?Ms. Thivierge was given information about Medicaid Managed Care team care coordination services as a part of their [facility] Medicaid benefit. Ethelyn Cerniglia verbally consented to engagement with the [facility] Managed Care team.  ? ?If you are experiencing a medical emergency, please call 911 or report to your local emergency department or urgent care.  ? ?If you have a non-emergency medical problem during routine business hours, please contact your provider's office and ask to speak with a nurse.  ? ?For questions related to your [facility] health plan, please call: [phone] or go here:[email or web address] ? ?If you would like to schedule transportation through your [facility] plan, please call the following number at least 2 days in advance of your appointment: [phone]. ? You can also use the MTM portal or MTM mobile app to manage your rides. For the portal, please go to [email or web address] ? ?Call the Behavioral Health Crisis Line at 5201220515, at any time, 24 hours a day, 7 days a week. If you are in danger or need immediate medical attention call 911. ? ?If you would like help to quit smoking, call 1-800-QUIT-NOW (929 359 3650) OR Espa?ol: 1-855-D?jelo-Ya 8154761488) o para m?s informaci?n haga clic aqu? or Text READY to 200-400 to register via text ? ?Ms. Mullan - following are the goals we discussed in your visit today:  ? Goals Addressed   ? ?  ?  ?  ?  ? This Visit's Progress  ?  Protect My Health     ?  Timeframe:  Long-Range Goal ?Priority:  High ?Start Date:      08/19/21                       ?Expected End Date:    ongoing                  ? ?Follow Up Date: 04/19/22 ?  ?- practice safe sex ?- schedule appointment for flu shot ?- schedule appointment for vaccines needed due to my age or health ?- schedule recommended health tests (blood work, mammogram, colonoscopy, pap test) ?- schedule and keep  appointment for annual check-up  ?  ?Why is this important?   ?Screening tests can find diseases early when they are easier to treat.  ?Your doctor or nurse will talk with you about which tests are important for you.  ?Getting shots for common diseases like the flu and shingles will help prevent them.   ?  ?Notes:  ?03/19/22: Patient without complaint today-declines any services  ? ?Patient verbalizes understanding of instructions and care plan provided today and agrees to view in MyChart. Active MyChart status confirmed with patient.   ? ?The Managed Medicaid care management team will reach out to the patient again over the next 30 days.  ?The  Patient  has been provided with contact information for the Managed Medicaid care management team and has been advised to call with any health related questions or concerns.  ? ?Kathi Der RN, BSN ?  [facility] ?Care Management Coordinator - Managed Medicaid High Risk ?[phone] ?  ?Following is a copy of your plan of care:  ?Care Plan : Behavioral Health  ?Updates made by Danie Chandler, RN since 03/19/2022 12:00 AM  ?  ? ?Problem: Behavioral health   ?Priority: High  ?Onset Date: 08/19/2021  ?  ? ?Long-Range Goal: Self-Management Plan Developed   ?Start Date:  08/19/2021  ?Expected End Date: 06/19/2022  ?Recent Progress: Not on track  ?Priority: High  ?Note:   ?Current Barriers:  ?Knowledge Deficits related to plan of care for management of anxiety, depression  ?Care Coordination needs related to Financial constraints related to food, Transportation, Limited access to food, Housing barriers, Mental Health Concerns , and Lacks knowledge of community resource: for food, housing.  Also needs GYN, MMG referral. ?Corporate treasurer.  ?Transportation barriers ?03/19/22:  patient with no complaints today-denies and declines need for any services ? ?RNCM Clinical Goal(s):  ?Patient will verbalize understanding of plan for management of Anxiety and  Depression ?take all medications exactly as prescribed and will call provider for medication related questions ?attend all scheduled medical appointments: ?work with pharmacist to address medications. ?work with Child psychotherapist to address the management of Anxiety and Depression through collaboration with Medical illustrator, provider, and care team.  ? ?Interventions: ?Inter-disciplinary care team collaboration (see longitudinal plan of care) ?Evaluation of current treatment plan related to  self management and patient's adherence to plan as established by provider ?Collaboration with pharmacist-completed and continues to follow. ?Collaboration with LCSW for anxiety/depression-LCSW following-appointment for therapy scheduled. ?Collaboration with BSW for food/housing resources and GYN/MMG referral.  BSW attempted to call patient and was unsuccessful. ? ?Patient Goals/Self-Care Activities: ?Patient will self administer medications as prescribed ?Patient will attend all scheduled provider appointments ?Patient will call pharmacy for medication refills ?Patient will continue to perform ADL's independently ?Patient will continue to perform IADL's independently ?Patient will call provider office for new concerns or questions ?Patient will work with BSW to address care coordination needs and will continue to work with the clinical team to address health care and disease management related needs.   ?Patient will work with LCSW for anxiety/depression.  ?  ?

## 2022-03-19 NOTE — Patient Outreach (Signed)
?Medicaid Managed Care   ?Nurse Care Manager Note ? ?03/19/2022 ?Name:  Kaylee Simpson MRN:  [id number] DOB:  1976-09-21 ? ?Kaylee Simpson is an 46 y.o. year old female who is a primary patient of Nolene Ebbs, MD.  The Medicaid Managed Care Coordination team was consulted for assistance with:    ?Healthcare management needs, tobacco use, h/o anxiety/depression ? ?Ms. Fluegel was given information about Medicaid Managed Care Coordination team services today. [place] Patient agreed to services and verbal consent obtained. ? ?Engaged with patient by telephone for follow up visit in response to provider referral for case management and/or care coordination services.  ? ?Assessments/Interventions:  Review of past medical history, allergies, medications, health status, including review of consultants reports, laboratory and other test data, was performed as part of comprehensive evaluation and provision of chronic care management services. ? ?SDOH (Social Determinants of Health) assessments and interventions performed: ?SDOH Interventions   ? ?Flowsheet Row Most Recent Value  ?SDOH Interventions   ?Intimate Partner Violence Interventions [facility], Other (Comment)  [has spoken police and advocte]  ?Physical Activity Interventions Patient Refused  ? ?  ?Care Plan ? ?Allergies  ?Allergen Reactions  ? Gabapentin Other (See Comments)  ?  Mucscle twitching   ? ? ?Medications Reviewed Today   ? ? Reviewed by Gayla Medicus, RN (Registered Nurse) on 03/19/22 at 1428  Med List Status: <None>  ? ?Medication Order Taking? Sig Documenting Provider Last Dose Status Informant  ?acetaminophen (TYLENOL) 500 MG tablet [id number] Yes Take 500 mg by mouth every 6 (six) hours as needed for mild pain. [provider] Taking Active Self  ?aspirin EC 325 MG tablet [id number]  Take 1 tablet (325 mg total) by mouth daily.  ?Patient not taking: Reported on 08/28/2021  ? Persons, Bevely Palmer, [place]  Active Self   ?chlorhexidine (HIBICLENS) 4 % external liquid [id number]  Apply topically daily as needed.  ?Patient not taking: No sig reported  ? Volney American, [place]  Active Self  ?         ?Med Note Thayer Ohm, RACHEL J   Fri Aug 02, 2021  6:02 PM) Never picked up  ?gabapentin (NEURONTIN) 300 MG capsule [id number] No Take 300 mg by mouth 3 (three) times daily as needed (pain). Takes 3 times per week  ?Patient not taking: Reported on 03/19/2022  ? [provider] Not Taking Active Self  ?hydrochlorothiazide (HYDRODIURIL) 12.5 MG tablet [id number]  Take 12.5 mg by mouth daily.  ?Patient not taking: No sig reported  ? [provider]  Active   ?ibuprofen (ADVIL) 200 MG tablet [id number] Yes Take 600-800 mg by mouth every 6 (six) hours as needed for headache or mild pain. [provider] Taking Active Self  ?losartan-hydrochlorothiazide (HYZAAR) 50-12.5 MG tablet [id number] No Take 1 tablet by mouth daily.  ?Patient not taking: Reported on 03/19/2022  ? [provider] Not Taking Active   ?meloxicam (MOBIC) 15 MG tablet [id number]  Take 1 tablet (15 mg total) by mouth daily.  ?Patient not taking: Reported on 08/28/2021  ? Persons, Bevely Palmer, [place]  Active   ?Norgestim-Eth Estrad Triphasic (TRI-LO-SPRINTEC PO) [id number]  Take 1 tablet by mouth daily.  ?Patient not taking: Reported on 08/28/2021  ? [provider]  Active   ?oxyCODONE-acetaminophen (PERCOCET) 5-325 MG tablet [id number]  Take 1 tablet by mouth every 4 (four) hours as needed for severe pain.  ?Patient not taking: No sig reported  ? Persons, Bevely Palmer, [place]  Active Self  ?TRI-SPRINTEC 0.18/0.215/0.25 MG-35 MCG tablet [id number] Yes Take 1 tablet by mouth daily. [provider] Taking Active Self  ?Vitamin D, Ergocalciferol, (DRISDOL) 1.25 MG (50000 UNIT) CAPS capsule [id number] Yes Take 50,000 Units by mouth every 7 (seven) days. taking [provider] Taking Active Self  ?         ?Med Note Thayer Ohm, RACHEL J   Fri Aug 02, 2021  6:04 PM) Out of refills  ? ?  ?  ? ?  ? ?Patient Active Problem List  ? Diagnosis Date Noted  ? Traumatic compartment syndrome of left lower extremity ([place])   ? Pain from implanted hardware   ? Acute metabolic encephalopathy [id number]  ? Sepsis ([place]) 07/15/2020  ? Aspiration pneumonia of both lower lobes due to gastric secretions ([place]) 07/15/2020  ? Polysubstance abuse ([place]) 07/15/2020  ? Drug overdose 07/15/2020  ? Drug overdose, accidental or unintentional, initial encounter 07/14/2020  ? Closed nondisplaced oblique fracture of shaft of fibula with routine healing 12/05/2019  ? Trochanteric bursitis, left hip 05/24/2018  ? Pain in left ankle and joints of left foot 05/24/2018  ? Chronic pain of left ankle 12/10/2016  ? Status post open reduction with internal fixation (ORIF) of fracture of ankle 12/10/2016  ? Status post ORIF of fracture of ankle 04/16/2016  ? Anemia 10/08/2012  ? Pyelonephritis 10/05/2012  ? Hypokalemia 10/05/2012  ? Hyponatremia 10/05/2012  ? Tachycardia 10/05/2012  ? SIRS (systemic inflammatory response syndrome) ([place]) 10/05/2012  ? Acute renal insufficiency 10/05/2012  ? ?Conditions to be addressed/monitored per PCP order:  Healthcare management needs, tobacco use, h/o anxiety/depression ? ?Care Plan : Behavioral Health  ?Updates made by Gayla Medicus, RN since 03/19/2022 12:00 AM  ?  ? ?Problem: Behavioral health   ?Priority: High  ?Onset Date: 08/19/2021  ?  ? ?Long-Range Goal: Self-Management Plan Developed   ?Start Date: 08/19/2021  ?Expected End Date: 06/19/2022  ?Recent Progress: Not on track  ?Priority: High  ?Note:   ?Current Barriers:  ?Knowledge Deficits related to plan of care for management of anxiety, depression  ?Care Coordination needs related to Financial constraints related to food, Transportation, Limited access to food, Housing barriers, Mental Health Concerns , and Lacks knowledge of community resource: for food, housing.  Also needs GYN, MMG referral. ?Museum/gallery exhibitions officer.  ?Transportation barriers ?03/19/22:  patient with no complaints today-denies and declines need for any services ? ?RNCM Clinical Goal(s):  ?Patient will verbalize understanding of plan for management of Anxiety and Depression ?take all medications exactly as prescribed and will call provider for medication related questions ?attend all scheduled medical appointments: ?work with pharmacist to address medications. ?work with Education officer, museum to address the management of Anxiety and Depression through collaboration with Consulting civil engineer, provider, and care team.  ? ?Interventions: ?Inter-disciplinary care team collaboration (see longitudinal plan of care) ?Evaluation of current treatment plan related to  self management and patient's adherence to plan as established by provider ?Collaboration with pharmacist-completed and continues to follow. ?Collaboration with LCSW for anxiety/depression-LCSW following-appointment for therapy scheduled. ?Collaboration with BSW for food/housing resources and GYN/MMG referral.  BSW attempted to call patient and was unsuccessful. ? ?Patient Goals/Self-Care Activities: ?Patient will self administer medications as prescribed ?Patient will attend all scheduled provider appointments ?Patient will call pharmacy for medication refills ?Patient will continue to perform ADL's independently ?Patient will continue to perform IADL's independently ?Patient will call provider office for new concerns or questions ?Patient will work with BSW to  address care coordination needs and will continue to work with the clinical team to address health care and disease management related needs.   ?Patient will work with LCSW for anxiety/depression.  ? ?Follow Up:  Patient agrees to Care Plan and Follow-up. ? ?Plan: The Managed Medicaid care management team will reach out to the patient again over the next 30 days. and The  Patient has been provided with contact information for the Managed Medicaid care  management team and has been advised to call with any health related questions or concerns. ? ?Date/time of next scheduled RN care management/care coordination outreach: 04/16/22 at 330 ?

## 2022-03-29 DIAGNOSIS — Z419 Encounter for procedure for purposes other than remedying health state, unspecified: Secondary | ICD-10-CM | POA: Diagnosis not present

## 2022-04-16 ENCOUNTER — Other Ambulatory Visit: Payer: Self-pay | Admitting: Obstetrics and Gynecology

## 2022-04-16 NOTE — Patient Instructions (Signed)
Hi Kaylee Simpson, thank you for speaking with me-have a great rest of the day!! ? ?Kaylee Simpson was given information about Medicaid Managed Care team care coordination services as a part of their [facility] Medicaid benefit. Kaylee Simpson verbally consented to engagement with the [facility] Managed Care team.  ? ?If you are experiencing a medical emergency, please call 911 or report to your local emergency department or urgent care.  ? ?If you have a non-emergency medical problem during routine business hours, please contact your provider's office and ask to speak with a nurse.  ? ?For questions related to your [facility] health plan, please call: [phone] or go here:[email or web address] ? ?If you would like to schedule transportation through your [facility] plan, please call the following number at least 2 days in advance of your appointment: [phone]. ? You can also use the MTM portal or MTM mobile app to manage your rides. For the portal, please go to [email or web address] ? ?Call the Behavioral Health Crisis Line at [phone], at any time, 24 hours a day, 7 days a week. If you are in danger or need immediate medical attention call 911. ? ?If you would like help to quit smoking, call 1-800-QUIT-NOW (763-024-1019) OR Espa?ol: 1-855-D?jelo-Ya (915)357-5334) o para m?s informaci?n haga clic aqu? or Text READY to 200-400 to register via text ? ?Kaylee Simpson - following are the goals we discussed in your visit today:  ? Goals Addressed   ? ?  ?  ?  ?  ? This Visit's Progress  ?  Protect My Health     ?  Timeframe:  Long-Range Goal ?Priority:  High ?Start Date:      08/19/21                       ?Expected End Date:    ongoing                  ? ?Follow Up Date: 05/22/22 ?  ?- practice safe sex ?- schedule appointment for flu shot ?- schedule appointment for vaccines needed due to my age or health ?- schedule recommended health tests (blood work, mammogram, colonoscopy, pap test) ?- schedule and keep  appointment for annual check-up  ?  ?Why is this important?   ?Screening tests can find diseases early when they are easier to treat.  ?Your doctor or nurse will talk with you about which tests are important for you.  ?Getting shots for common diseases like the flu and shingles will help prevent them.   ?  ?Notes:  ?04/16/22-patient requests to speak with SW for depression  ? ?Patient verbalizes understanding of instructions and care plan provided today and agrees to view in MyChart. Active MyChart status confirmed with patient.   ? ?The Managed Medicaid care management team will reach out to the patient again over the next 30 business days.  ?The  Patient  has been provided with contact information for the Managed Medicaid care management team and has been advised to call with any health related questions or concerns.  ? ?Kathi Der RN, BSN ?[place]  [facility] ?Care Management Coordinator - Managed Medicaid High Risk ?[phone] ?  ?Following is a copy of your plan of care:  ?Care Plan : Behavioral Health  ?Updates made by Danie Chandler, RN since 04/16/2022 12:00 AM  ?  ? ?Problem: Behavioral health   ?Priority: High  ?Onset Date: 08/19/2021  ?  ? ?Long-Range Goal: Self-Management Plan Developed   ?  Start Date: 08/19/2021  ?Expected End Date: 06/19/2022  ?Recent Progress: Not on track  ?Priority: High  ?Note:   ?Current Barriers:  ?Knowledge Deficits related to plan of care for management of anxiety, depression  ?Care Coordination needs related to depression ?04/16/22:  patient with feelings of depression-would like to speak with SW ? ?RNCM Clinical Goal(s):  ?Patient will verbalize understanding of plan for management of Anxiety and Depression ?take all medications exactly as prescribed and will call provider for medication related questions ?attend all scheduled medical appointments: ?work with pharmacist to address medications. ?work with Child psychotherapist to address the management of Anxiety and  Depression through collaboration with Medical illustrator, provider, and care team.  ? ?Interventions: ?Inter-disciplinary care team collaboration (see longitudinal plan of care) ?Evaluation of current treatment plan related to  self management and patient's adherence to plan as established by provider ?Collaboration with pharmacist-completed and continues to follow. ?Collaboration with LCSW for anxiety/depression-LCSW following-appointment for therapy scheduled. ?Collaboration with BSW for food/housing resources and GYN/MMG referral.  BSW attempted to call patient and was unsuccessful. ? ?Patient Goals/Self-Care Activities: ?Patient will self administer medications as prescribed ?Patient will attend all scheduled provider appointments ?Patient will call pharmacy for medication refills ?Patient will continue to perform ADL's independently ?Patient will continue to perform IADL's independently ?Patient will call provider office for new concerns or questions ?Patient will work with BSW to address care coordination needs and will continue to work with the clinical team to address health care and disease management related needs.   ?Patient will work with LCSW for anxiety/depression.  ?  ?

## 2022-04-16 NOTE — Patient Outreach (Signed)
Care Coordination ? ?04/16/2022 ? ?Latresa Schipani ?[birth year] ?[id number] ? ? ?Medicaid Managed Care  ? ?Unsuccessful Outreach Note ? ?04/16/2022 ?Name: Laporsche Hoeger MRN: [id number] DOB: [birth year] ? ?Referred by: Fleet Contras, MD ?Reason for referral : High Risk Managed Medicaid (Unsuccessful telephone outreach) ? ? ?An unsuccessful telephone outreach was attempted today. The patient was referred to the case management team for assistance with care management and care coordination.  ? ?Follow Up Plan: The care management team will reach out to the patient again over the next 30 business  days.  ? ?Kathi Der RN, BSN ?[place]  [facility] ?Care Management Coordinator - Managed Medicaid High Risk ?[phone] ?  ? ? ?

## 2022-04-16 NOTE — Patient Outreach (Signed)
?Medicaid Managed Care   ?Nurse Care Manager Note ? ?04/16/2022 ?Name:  Kaylee Simpson MRN:  [id number] DOB:  [birth year] ? ?Kaylee Simpson is an 46 y.o. year old female who is a primary patient of Nolene Ebbs, MD.  The Medicaid Managed Care Coordination team was consulted for assistance with:    ?Healthcare management needs, depression ? ?[name] was given information about Medicaid Managed Care Coordination team services today. [place] Patient agreed to services and verbal consent obtained. ? ?Engaged with patient by telephone for follow up visit in response to provider referral for case management and/or care coordination services.  ? ?Assessments/Interventions:  Review of past medical history, allergies, medications, health status, including review of consultants reports, laboratory and other test data, was performed as part of comprehensive evaluation and provision of chronic care management services. ? ?SDOH (Social Determinants of Health) assessments and interventions performed: ?SDOH Interventions   ? ?Flowsheet Row Most Recent Value  ?SDOH Interventions   ?Financial Strain Interventions Intervention Not Indicated  ? ?  ? ?Care Plan ? ?Allergies  ?Allergen Reactions  ? Gabapentin Other (See Comments)  ?  Mucscle twitching   ? ? ?Medications Reviewed Today   ? ? Reviewed by Gayla Medicus, RN (Registered Nurse) on 03/19/22 at 1428  Med List Status: <None>  ? ?Medication Order Taking? Sig Documenting Provider Last Dose Status Informant  ?acetaminophen (TYLENOL) 500 MG tablet [id number] Yes Take 500 mg by mouth every 6 (six) hours as needed for mild pain. [provider] Taking Active Self  ?aspirin EC 325 MG tablet [id number]  Take 1 tablet (325 mg total) by mouth daily.  ?Patient not taking: Reported on 08/28/2021  ? Persons, Bevely Palmer, [place]  Active Self  ?chlorhexidine (HIBICLENS) 4 % external liquid [id number]  Apply topically daily as needed.  ?Patient not taking: No sig reported  ? Volney American, [place]  Active Self  ?         ?Med Note Thayer Ohm, RACHEL J   Fri Aug 02, 2021  6:02 PM) Never picked up  ?gabapentin (NEURONTIN) 300 MG capsule [id number] No Take 300 mg by mouth 3 (three) times daily as needed (pain). Takes 3 times per week  ?Patient not taking: Reported on 03/19/2022  ? [provider] Not Taking Active Self  ?hydrochlorothiazide (HYDRODIURIL) 12.5 MG tablet [id number]  Take 12.5 mg by mouth daily.  ?Patient not taking: No sig reported  ? [provider]  Active   ?ibuprofen (ADVIL) 200 MG tablet [id number] Yes Take 600-800 mg by mouth every 6 (six) hours as needed for headache or mild pain. [provider] Taking Active Self  ?losartan-hydrochlorothiazide (HYZAAR) 50-12.5 MG tablet [id number] No Take 1 tablet by mouth daily.  ?Patient not taking: Reported on 03/19/2022  ? [provider] Not Taking Active   ?meloxicam (MOBIC) 15 MG tablet [id number]  Take 1 tablet (15 mg total) by mouth daily.  ?Patient not taking: Reported on 08/28/2021  ? Persons, Bevely Palmer, [place]  Active   ?Norgestim-Eth Estrad Triphasic (TRI-LO-SPRINTEC PO) [id number]  Take 1 tablet by mouth daily.  ?Patient not taking: Reported on 08/28/2021  ? [provider]  Active   ?oxyCODONE-acetaminophen (PERCOCET) 5-325 MG tablet [id number]  Take 1 tablet by mouth every 4 (four) hours as needed for severe pain.  ?Patient not taking: No sig reported  ? Persons, Bevely Palmer, [place]  Active Self  ?TRI-SPRINTEC 0.18/0.215/0.25 MG-35 MCG tablet [id number] Yes Take 1 tablet by mouth daily. [provider] Taking Active Self  ?Vitamin D, Ergocalciferol, (DRISDOL) 1.25 MG (50000 UNIT) CAPS capsule [id number] Yes Take 50,000 Units by mouth every 7 (seven) days. taking [provider] Taking Active Self  ?         ?Med Note Thayer Ohm, RACHEL J   Fri Aug 02, 2021  6:04 PM) Out of refills  ? ?  ?  ? ?  ? ?Patient Active Problem List  ? Diagnosis Date Noted  ? 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 Pain from implanted hardware   ? Acute metabolic encephalopathy [id number]  ? Sepsis ([place]) 07/15/2020  ? Aspiration pneumonia of both lower lobes due to gastric secretions ([place]) 07/15/2020  ? Polysubstance abuse ([place]) 07/15/2020  ? Drug overdose 07/15/2020  ? Drug overdose, accidental or unintentional, initial encounter 07/14/2020  ? Closed nondisplaced oblique fracture of shaft of fibula with routine healing 12/05/2019  ? Trochanteric bursitis, left hip 05/24/2018  ? Pain in left ankle and joints of left foot 05/24/2018  ? Chronic pain of left ankle 12/10/2016  ? Status post open reduction with internal fixation (ORIF) of fracture of ankle 12/10/2016  ? Status post ORIF of fracture of ankle 04/16/2016  ? Anemia 10/08/2012  ? Pyelonephritis 10/05/2012  ? Hypokalemia 10/05/2012  ? Hyponatremia 10/05/2012  ? Tachycardia 10/05/2012  ? SIRS (systemic inflammatory response syndrome) ([place]) 10/05/2012  ? Acute renal insufficiency 10/05/2012  ? ?Conditions to be addressed/monitored per PCP order:  Depression ? ?Care Plan : Behavioral Health  ?Updates made by Gayla Medicus, RN since 04/16/2022 12:00 AM  ?  ? ?Problem: Behavioral health   ?Priority: High  ?Onset Date: 08/19/2021  ?  ? ?Long-Range Goal: Self-Management Plan Developed   ?Start Date: 08/19/2021  ?Expected End Date: 06/19/2022  ?Recent Progress: Not on track  ?Priority: High  ?Note:   ?Current Barriers:  ?Knowledge Deficits related to plan of care for management of anxiety, depression  ?Care Coordination needs related to depression ?04/16/22:  patient with feelings of depression-would like to speak with SW ? ?RNCM Clinical Goal(s):  ?Patient will verbalize understanding of plan for management of Anxiety and Depression ?take all medications exactly as prescribed and will call provider for medication related questions ?attend all scheduled medical appointments: ?work with pharmacist to address medications. ?work with Education officer, museum to address  the management of Anxiety and Depression through collaboration with Consulting civil engineer, provider, and care team.  ? ?Interventions: ?Inter-disciplinary care team collaboration (see longitudinal plan of care) ?Evaluation of current treatment plan related to  self management and patient's adherence to plan as established by provider ?Collaboration with pharmacist-completed and continues to follow. ?Collaboration with LCSW for anxiety/depression-LCSW following-appointment for therapy scheduled. ?Collaboration with BSW for food/housing resources and GYN/MMG referral.  BSW attempted to call patient and was unsuccessful. ? ?Patient Goals/Self-Care Activities: ?Patient will self administer medications as prescribed ?Patient will attend all scheduled provider appointments ?Patient will call pharmacy for medication refills ?Patient will continue to perform ADL's independently ?Patient will continue to perform IADL's independently ?Patient will call provider office for new concerns or questions ?Patient will work with BSW to address care coordination needs and will continue to work with the clinical team to address health care and disease management related needs.   ?Patient will work with LCSW for anxiety/depression.  ? ?Follow Up:  Patient agrees to Care Plan and Follow-up. ? ?Plan: The Managed Medicaid care management team will reach out to the patient again over  the next 30 days. and The  Patient has been provided with contact information for the Managed Medicaid care management team and has been advised to call with any health related questions or concerns. ? ?Date/time of next scheduled RN care management/care coordination outreach:  05/21/14 at 115 ?

## 2022-04-18 ENCOUNTER — Other Ambulatory Visit: Payer: Self-pay

## 2022-04-18 NOTE — Patient Instructions (Signed)
Kaylee Simpson ,  ? ?The Medicaid Managed Care Team is available to provide assistance to you with your healthcare needs at no cost and as a benefit of your Medicaid Health plan. I'm sorry I was unable to reach you today for our scheduled appointment. Our care guide will call you to reschedule our telephone appointment. Please call me at the number below. I am available to be of assistance to you regarding your healthcare needs. .  ? ?Thank you,  ? ?Lekita Kerekes, BSW, MSW, LCSW ?Managed Medicaid LCSW ?[place]  [facility] ?[email or web address] ?Phone: [phone] ? ? ?

## 2022-04-18 NOTE — Patient Outreach (Signed)
Kaylee Simpson Kaylee Simpson) Care Management ? ?04/18/2022 ? ?Kaylee Simpson ?[birth year] ?[id number] ? ?LCSW completed [facility] outreach attempt today during scheduled appointment time but was unable to reach patient successfully. Kaylee HIPPA compliant voice message was left encouraging patient to return call once available. LCSW will ask Scheduling Care Guide to reschedule [facility] SW appointment with patient as well. ? ?Eula Fried, BSW, MSW, LCSW ?Managed Medicaid LCSW ?[place] [facility] ?[email or web address] ?Phone: [phone] ? ? ? ? ?

## 2022-04-21 ENCOUNTER — Telehealth: Payer: Self-pay | Admitting: Internal Medicine

## 2022-04-21 ENCOUNTER — Telehealth: Payer: Self-pay | Admitting: Orthopedic Surgery

## 2022-04-21 NOTE — Telephone Encounter (Signed)
.. ?  Medicaid Managed Care  ? ?Unsuccessful Outreach Note ? ?04/21/2022 ?Name: Kaylee Simpson MRN: [id number] DOB: 02/27/1976 ? ?Referred by: Fleet Contras, MD ?Reason for referral : High Risk Managed Medicaid (I called the patient today to get her rescheduled with the MM LCSW. I left my name and number on her VM.) ? ? ?A second unsuccessful telephone outreach was attempted today. The patient was referred to the case management team for assistance with care management and care coordination.  ? ?Follow Up Plan: The care management team will reach out to the patient again over the next 7 days.  ? ? ? ? ?Weston Settle ?Care Guide, High Risk Medicaid Managed Care ?Embedded Care Coordination ?[place]  [facility]  ? ? ?SIGNATURE  ?

## 2022-04-21 NOTE — Telephone Encounter (Signed)
Called pt to sch with Sharol Given for next opening. Pt stated in my chart message they had left ankle and foot pain.  ?

## 2022-04-28 ENCOUNTER — Other Ambulatory Visit: Payer: Self-pay

## 2022-04-28 DIAGNOSIS — Z419 Encounter for procedure for purposes other than remedying health state, unspecified: Secondary | ICD-10-CM | POA: Diagnosis not present

## 2022-04-28 NOTE — Patient Outreach (Signed)
[facility]) Care Management ? ?04/28/2022 ? ?Kaylee Simpson ?26-Sep-1976 ?[id number] ? ?LCSW completed second [facility] outreach attempt today during scheduled appointment time but was unable to reach patient successfully. A HIPPA compliant voice message was left encouraging patient to return call once available. LCSW will ask Scheduling Care Guide to reschedule [facility] SW appointment with patient as well. ? ?Dickie La, BSW, MSW, LCSW ?Managed Medicaid LCSW ?[place]  [facility] ?[email or web address] ?Phone: [phone] ? ? ? ?

## 2022-04-28 NOTE — Patient Instructions (Signed)
Kaylee Simpson ,  ? ?The [facility] Managed Care Team is available to provide assistance to you with your healthcare needs at no cost and as a benefit of your [facility] Health plan. I'm sorry I was unable to reach you today for our scheduled appointment. Our care guide will call you to reschedule our telephone appointment. Please call me at the number below. I am available to be of assistance to you regarding your healthcare needs. .  ? ?Thank you,  ? ?Dickie La, BSW, MSW, LCSW ?Managed Medicaid LCSW ?[place]  [facility] ?[email or web address] ?Phone: [phone] ? ? ?

## 2022-04-29 ENCOUNTER — Telehealth: Payer: Self-pay | Admitting: Internal Medicine

## 2022-04-29 NOTE — Telephone Encounter (Signed)
.. ?  Medicaid Managed Care  ? ?Unsuccessful Outreach Note ? ?04/29/2022 ?Name: Desa Rech MRN: [id number] DOB: [birth year] ? ?Referred by: Fleet Contras, MD ?Reason for referral : High Risk Managed Medicaid (I called the patient today to get her rescheduled with the MM LCSW. I left my name and number on her VM.) ? ? ?Third unsuccessful telephone outreach was attempted today. The patient was referred to the case management team for assistance with care management and care coordination. The patient's primary care provider has been notified of our unsuccessful attempts to make or maintain contact with the patient. The care management team is pleased to engage with this patient at any time in the future should he/she be interested in assistance from the care management team.  ? ?Follow Up Plan: We have been unable to make contact with the patient for follow up. The care management team is available to follow up with the patient after provider conversation with the patient regarding recommendation for care management engagement and subsequent re-referral to the care management team.  ? ?SIGNATURE  ?

## 2022-05-08 ENCOUNTER — Ambulatory Visit: Payer: Medicaid Other | Admitting: Orthopedic Surgery

## 2022-05-15 ENCOUNTER — Ambulatory Visit: Payer: Medicaid Other | Admitting: Orthopedic Surgery

## 2022-05-21 ENCOUNTER — Other Ambulatory Visit: Payer: Self-pay | Admitting: Obstetrics and Gynecology

## 2022-05-21 NOTE — Patient Outreach (Cosign Needed)
Care Coordination  05/21/2022  Ayssa Krenz [birth year] [id number]   Medicaid Managed Care   Unsuccessful Outreach Note  05/21/2022 Name: Louwana Gilgen MRN: [id number] DOB: [birth year]  Referred by: Nolene Ebbs, MD Reason for referral : High Risk Managed Medicaid (Unsuccessful telephone outreach)   Third unsuccessful telephone outreach was attempted.  The patient was referred to the case management team for assistance with care management and care coordination. The patient's primary care provider has been notified of our unsuccessful attempts to make or maintain contact with the patient. The care management team is pleased to engage with this patient at any time in the future should he/she be interested in assistance from the care management team.   Follow Up Plan: We have been unable to make contact with the patient for follow up. The care management team is available to follow up with the patient after provider conversation with the patient regarding recommendation for care management engagement and subsequent re-referral to the care management team.   Aida Raider RN, BSN [place] Management Coordinator - Managed [place] High Risk [phone]

## 2022-05-21 NOTE — Patient Instructions (Signed)
Hi Kaylee Simpson, sorry we have been missing you, I hope you are okay- as a part of your Medicaid benefit, you are eligible for care management and care coordination services at no cost or copay. I was unable to reach you by phone today but would be happy to help you with your health related needs. Please feel free to call me at [phone]  Kathi Der RN, BSN [place]  [facility] Care Management Coordinator - Managed [place] High Risk [phone]

## 2022-05-29 DIAGNOSIS — Z419 Encounter for procedure for purposes other than remedying health state, unspecified: Secondary | ICD-10-CM | POA: Diagnosis not present

## 2022-06-28 DIAGNOSIS — Z419 Encounter for procedure for purposes other than remedying health state, unspecified: Secondary | ICD-10-CM | POA: Diagnosis not present

## 2022-07-29 DIAGNOSIS — Z419 Encounter for procedure for purposes other than remedying health state, unspecified: Secondary | ICD-10-CM | POA: Diagnosis not present

## 2022-08-29 DIAGNOSIS — Z419 Encounter for procedure for purposes other than remedying health state, unspecified: Secondary | ICD-10-CM | POA: Diagnosis not present

## 2022-09-28 DIAGNOSIS — Z419 Encounter for procedure for purposes other than remedying health state, unspecified: Secondary | ICD-10-CM | POA: Diagnosis not present

## 2022-10-28 DIAGNOSIS — Z7689 Persons encountering health services in other specified circumstances: Secondary | ICD-10-CM | POA: Diagnosis not present

## 2022-10-29 DIAGNOSIS — Z419 Encounter for procedure for purposes other than remedying health state, unspecified: Secondary | ICD-10-CM | POA: Diagnosis not present

## 2022-11-06 DIAGNOSIS — Z7689 Persons encountering health services in other specified circumstances: Secondary | ICD-10-CM | POA: Diagnosis not present

## 2022-12-02 DIAGNOSIS — N393 Stress incontinence (female) (male): Secondary | ICD-10-CM | POA: Diagnosis not present

## 2022-12-02 DIAGNOSIS — R82998 Other abnormal findings in urine: Secondary | ICD-10-CM | POA: Diagnosis not present

## 2022-12-15 ENCOUNTER — Other Ambulatory Visit: Payer: Self-pay | Admitting: Internal Medicine

## 2022-12-15 DIAGNOSIS — N760 Acute vaginitis: Secondary | ICD-10-CM | POA: Diagnosis not present

## 2022-12-15 DIAGNOSIS — E559 Vitamin D deficiency, unspecified: Secondary | ICD-10-CM | POA: Diagnosis not present

## 2022-12-15 DIAGNOSIS — Z124 Encounter for screening for malignant neoplasm of cervix: Secondary | ICD-10-CM | POA: Diagnosis not present

## 2022-12-15 DIAGNOSIS — Z13220 Encounter for screening for lipoid disorders: Secondary | ICD-10-CM | POA: Diagnosis not present

## 2022-12-15 DIAGNOSIS — Z0000 Encounter for general adult medical examination without abnormal findings: Secondary | ICD-10-CM | POA: Diagnosis not present

## 2022-12-16 LAB — CBC
HCT: 35 % (ref 35.0–45.0)
Hemoglobin: 12.2 g/dL (ref 11.7–15.5)
MCH: 31.9 pg (ref 27.0–33.0)
MCHC: 34.9 g/dL (ref 32.0–36.0)
MCV: 91.4 fL (ref 80.0–100.0)
MPV: 10.2 fL (ref 7.5–12.5)
Platelets: 279 10*3/uL (ref 140–400)
RBC: 3.83 10*6/uL (ref 3.80–5.10)
RDW: 12.2 % (ref 11.0–15.0)
WBC: 8.3 10*3/uL (ref 3.8–10.8)

## 2022-12-16 LAB — COMPLETE METABOLIC PANEL WITH GFR
AG Ratio: 1.2 (calc) (ref 1.0–2.5)
ALT: 14 U/L (ref 6–29)
AST: 15 U/L (ref 10–35)
Albumin: 3.7 g/dL (ref 3.6–5.1)
Alkaline phosphatase (APISO): 80 U/L (ref 31–125)
BUN: 12 mg/dL (ref 7–25)
CO2: 21 mmol/L (ref 20–32)
Calcium: 9.1 mg/dL (ref 8.6–10.2)
Chloride: 108 mmol/L (ref 98–110)
Creat: 0.67 mg/dL (ref 0.50–0.99)
Globulin: 3.2 g/dL (calc) (ref 1.9–3.7)
Glucose, Bld: 108 mg/dL — ABNORMAL HIGH (ref 65–99)
Potassium: 4 mmol/L (ref 3.5–5.3)
Sodium: 140 mmol/L (ref 135–146)
Total Bilirubin: 0.2 mg/dL (ref 0.2–1.2)
Total Protein: 6.9 g/dL (ref 6.1–8.1)
eGFR: 108 mL/min/{1.73_m2} (ref >=60)

## 2022-12-16 LAB — LIPID PANEL
Cholesterol: 198 mg/dL (ref <200)
HDL: 69 mg/dL (ref >=50)
LDL Cholesterol (Calc): 102 mg/dL (calc) — ABNORMAL HIGH
Non-HDL Cholesterol (Calc): 129 mg/dL (calc) (ref <130)
Total CHOL/HDL Ratio: 2.9 (calc) (ref <5.0)
Triglycerides: 176 mg/dL — ABNORMAL HIGH (ref <150)

## 2022-12-16 LAB — C. TRACHOMATIS/N. GONORRHOEAE RNA
C. trachomatis RNA, TMA: NOT DETECTED
N. gonorrhoeae RNA, TMA: NOT DETECTED

## 2022-12-16 LAB — TSH: TSH: 1.39 mIU/L

## 2022-12-16 LAB — VITAMIN D 25 HYDROXY (VIT D DEFICIENCY, FRACTURES): Vit D, 25-Hydroxy: 16 ng/mL — ABNORMAL LOW (ref 30–100)

## 2022-12-17 ENCOUNTER — Other Ambulatory Visit: Payer: Self-pay | Admitting: Internal Medicine

## 2022-12-17 DIAGNOSIS — E559 Vitamin D deficiency, unspecified: Secondary | ICD-10-CM | POA: Diagnosis not present

## 2022-12-17 DIAGNOSIS — N760 Acute vaginitis: Secondary | ICD-10-CM | POA: Diagnosis not present

## 2022-12-17 DIAGNOSIS — Z124 Encounter for screening for malignant neoplasm of cervix: Secondary | ICD-10-CM | POA: Diagnosis not present

## 2022-12-17 DIAGNOSIS — Z0000 Encounter for general adult medical examination without abnormal findings: Secondary | ICD-10-CM | POA: Diagnosis not present

## 2022-12-17 DIAGNOSIS — Z13220 Encounter for screening for lipoid disorders: Secondary | ICD-10-CM | POA: Diagnosis not present

## 2022-12-18 LAB — PAP IG W/ RFLX HPV ASCU

## 2024-06-22 DIAGNOSIS — Z131 Encounter for screening for diabetes mellitus: Secondary | ICD-10-CM | POA: Diagnosis not present

## 2024-06-22 DIAGNOSIS — E559 Vitamin D deficiency, unspecified: Secondary | ICD-10-CM | POA: Diagnosis not present

## 2024-06-22 DIAGNOSIS — Z13220 Encounter for screening for lipoid disorders: Secondary | ICD-10-CM | POA: Diagnosis not present

## 2024-06-22 DIAGNOSIS — Z0000 Encounter for general adult medical examination without abnormal findings: Secondary | ICD-10-CM | POA: Diagnosis not present

## 2024-06-22 DIAGNOSIS — I10 Essential (primary) hypertension: Secondary | ICD-10-CM | POA: Diagnosis not present

## 2024-06-22 DIAGNOSIS — Z124 Encounter for screening for malignant neoplasm of cervix: Secondary | ICD-10-CM | POA: Diagnosis not present

## 2024-06-22 DIAGNOSIS — Z1239 Encounter for other screening for malignant neoplasm of breast: Secondary | ICD-10-CM | POA: Diagnosis not present

## 2024-06-22 DIAGNOSIS — N951 Menopausal and female climacteric states: Secondary | ICD-10-CM | POA: Diagnosis not present

## 2024-06-22 DIAGNOSIS — Z1211 Encounter for screening for malignant neoplasm of colon: Secondary | ICD-10-CM | POA: Diagnosis not present

## 2024-06-23 ENCOUNTER — Telehealth: Payer: Self-pay

## 2024-06-23 NOTE — Telephone Encounter (Signed)
 Called the pt to get the pt scheduled from a referral from [facility] for a PAP. Was unable to leave a message.

## 2024-07-08 ENCOUNTER — Encounter: Admitting: Obstetrics & Gynecology
